# Patient Record
Sex: Female | Born: 1945
Health system: Southern US, Community
[De-identification: ages and names within clinical notes are randomized; demographics above are authoritative.]

## PROBLEM LIST (undated history)

## (undated) DIAGNOSIS — E663 Overweight: Secondary | ICD-10-CM

## (undated) DIAGNOSIS — I10 Essential (primary) hypertension: Secondary | ICD-10-CM

## (undated) DIAGNOSIS — E785 Hyperlipidemia, unspecified: Secondary | ICD-10-CM

## (undated) DIAGNOSIS — H04309 Unspecified dacryocystitis of unspecified lacrimal passage: Secondary | ICD-10-CM

## (undated) DIAGNOSIS — M8580 Other specified disorders of bone density and structure, unspecified site: Secondary | ICD-10-CM

## (undated) DIAGNOSIS — E669 Obesity, unspecified: Secondary | ICD-10-CM

## (undated) DIAGNOSIS — M1612 Unilateral primary osteoarthritis, left hip: Secondary | ICD-10-CM

## (undated) DIAGNOSIS — E1169 Type 2 diabetes mellitus with other specified complication: Secondary | ICD-10-CM

## (undated) DIAGNOSIS — K219 Gastro-esophageal reflux disease without esophagitis: Secondary | ICD-10-CM

## (undated) DIAGNOSIS — D649 Anemia, unspecified: Secondary | ICD-10-CM

## (undated) DIAGNOSIS — I671 Cerebral aneurysm, nonruptured: Secondary | ICD-10-CM

## (undated) HISTORY — DX: Hyperlipidemia, unspecified: E78.5

## (undated) HISTORY — DX: Anemia, unspecified: D64.9

## (undated) HISTORY — DX: Other specified disorders of bone density and structure, unspecified site: M85.80

## (undated) HISTORY — DX: Cerebral aneurysm, nonruptured: I67.1

## (undated) HISTORY — DX: Overweight: E66.3

## (undated) HISTORY — DX: Type 2 diabetes mellitus with other specified complication: E11.69

## (undated) HISTORY — DX: Unilateral primary osteoarthritis, left hip: M16.12

## (undated) HISTORY — DX: Obesity, unspecified: E66.9

## (undated) HISTORY — PX: ABDOMINAL HYSTERECTOMY: SHX81

## (undated) HISTORY — DX: Unspecified dacryocystitis of unspecified lacrimal passage: H04.309

## (undated) HISTORY — PX: CEREBRAL ANEURYSM REPAIR: SHX164

## (undated) HISTORY — DX: Gastro-esophageal reflux disease without esophagitis: K21.9

---

## 2008-04-24 LAB — HM COLONOSCOPY

## 2009-08-08 ENCOUNTER — Encounter: Payer: Self-pay | Admitting: Family Medicine

## 2009-08-08 LAB — HM COLONOSCOPY

## 2013-07-17 ENCOUNTER — Emergency Department (HOSPITAL_BASED_OUTPATIENT_CLINIC_OR_DEPARTMENT_OTHER): Payer: Medicare PPO

## 2013-07-17 ENCOUNTER — Encounter (HOSPITAL_BASED_OUTPATIENT_CLINIC_OR_DEPARTMENT_OTHER): Payer: Self-pay | Admitting: Emergency Medicine

## 2013-07-17 ENCOUNTER — Emergency Department (HOSPITAL_BASED_OUTPATIENT_CLINIC_OR_DEPARTMENT_OTHER)
Admission: EM | Admit: 2013-07-17 | Discharge: 2013-07-17 | Disposition: A | Discharge status: Home / Self Care | Payer: Medicare PPO | Attending: Emergency Medicine | Admitting: Emergency Medicine

## 2013-07-17 DIAGNOSIS — Z79899 Other long term (current) drug therapy: Secondary | ICD-10-CM | POA: Insufficient documentation

## 2013-07-17 DIAGNOSIS — B9789 Other viral agents as the cause of diseases classified elsewhere: Secondary | ICD-10-CM | POA: Insufficient documentation

## 2013-07-17 DIAGNOSIS — I10 Essential (primary) hypertension: Secondary | ICD-10-CM | POA: Insufficient documentation

## 2013-07-17 DIAGNOSIS — R630 Anorexia: Secondary | ICD-10-CM | POA: Insufficient documentation

## 2013-07-17 DIAGNOSIS — B349 Viral infection, unspecified: Secondary | ICD-10-CM

## 2013-07-17 DIAGNOSIS — J40 Bronchitis, not specified as acute or chronic: Secondary | ICD-10-CM | POA: Insufficient documentation

## 2013-07-17 HISTORY — DX: Essential (primary) hypertension: I10

## 2013-07-17 MED ORDER — ONDANSETRON 4 MG PO TBDP
4.0000 mg | ORAL_TABLET | Freq: Once | ORAL | Status: AC
  Administered 2013-07-17: 4 mg via ORAL
  Filled 2013-07-17: qty 1

## 2013-07-17 MED ORDER — NAPROXEN 500 MG PO TABS: 500.0000 mg | ORAL_TABLET | Freq: Two times a day (BID) | ORAL | Status: DC

## 2013-07-17 MED ORDER — HYDROCOD POLST-CHLORPHEN POLST 10-8 MG/5ML PO LQCR: 5.0000 mL | Freq: Two times a day (BID) | ORAL | Status: DC

## 2013-07-17 MED ORDER — GUAIFENESIN ER 600 MG PO TB12: 600.0000 mg | ORAL_TABLET | Freq: Two times a day (BID) | ORAL | Status: DC

## 2013-07-17 MED ORDER — HYDROCODONE-ACETAMINOPHEN 5-325 MG PO TABS
1.0000 | ORAL_TABLET | Freq: Once | ORAL | Status: AC
Start: 2013-07-17 — End: 2013-07-17
  Administered 2013-07-17: 1 via ORAL
  Filled 2013-07-17: qty 1

## 2013-07-17 NOTE — ED Notes (Signed)
Began with dry cough Thursday, with congestion, chills, fever.

## 2013-07-17 NOTE — Discharge Instructions (Signed)

## 2013-07-17 NOTE — ED Provider Notes (Signed)
CSN: [id number]     Arrival date & time 07/17/13  1323 History  This chart was scribed for Tanna Furry, MD by Jenne Campus, ED Scribe. This patient was seen in room MHT13/MHT13 and the patient's care was started at 3:38 PM.   Chief Complaint  Patient presents with  . URI    The history is provided by the patient. No language interpreter was used.    HPI Comments: Morgan Medina is a 68 y.o. female who presents to the Emergency Department complaining of NP cough with associated fever, sore throat, chills and nasal congestion that have been worsening since their onset 3 days ago. Max fever at home was 102. Temperature in the ED is 99.4. She reports a decreased appetite but states that she has been able to eat and drink without any complications. She denies any nausea or emesis, myalgias, HA or back pain. She has a h/o HTN that is treated with daily medication. She reports getting an influenza vaccine this year.    Past Medical History  Diagnosis Date  . Hypertension    Past Surgical History  Procedure Laterality Date  . Abdominal hysterectomy    . Cerebral aneurysm repair     History reviewed. No pertinent family history. History  Substance Use Topics  . Smoking status: Never Smoker   . Smokeless tobacco: Not on file  . Alcohol Use: No   No OB history provided.  Review of Systems  Constitutional: Positive for fever, chills and appetite change. Negative for diaphoresis and fatigue.  HENT: Positive for congestion and sore throat. Negative for mouth sores and trouble swallowing.   Eyes: Negative for visual disturbance.  Respiratory: Positive for cough. Negative for chest tightness, shortness of breath and wheezing.   Cardiovascular: Positive for chest pain.  Gastrointestinal: Negative for nausea, vomiting, abdominal pain, diarrhea and abdominal distention.  Endocrine: Negative for polydipsia, polyphagia and polyuria.  Genitourinary: Negative for dysuria, frequency and hematuria.   Musculoskeletal: Negative for gait problem.  Skin: Negative for color change, pallor and rash.  Neurological: Negative for dizziness, syncope, light-headedness and headaches.  Hematological: Does not bruise/bleed easily.  Psychiatric/Behavioral: Negative for behavioral problems and confusion.    Allergies  Review of patient's allergies indicates no known allergies.  Home Medications   Current Outpatient Rx  Name  Route  Sig  Dispense  Refill  . atorvastatin (LIPITOR) 40 MG tablet   Oral   Take 40 mg by mouth daily.         . ferrous sulfate 325 (65 FE) MG tablet   Oral   Take 325 mg by mouth daily with breakfast.         . losartan (COZAAR) 25 MG tablet   Oral   Take 25 mg by mouth daily.         . Multiple Vitamin (MULTIVITAMIN) capsule   Oral   Take 1 capsule by mouth daily.         Marland Kitchen triamterene-hydrochlorothiazide (DYAZIDE) 37.5-25 MG per capsule   Oral   Take 1 capsule by mouth daily.         . chlorpheniramine-HYDROcodone (TUSSIONEX PENNKINETIC ER) 10-8 MG/5ML LQCR   Oral   Take 5 mLs by mouth every 12 (twelve) hours.   60 mL   0   . guaiFENesin (MUCINEX) 600 MG 12 hr tablet   Oral   Take 1 tablet (600 mg total) by mouth 2 (two) times daily.   20 tablet   0   .  naproxen (NAPROSYN) 500 MG tablet   Oral   Take 1 tablet (500 mg total) by mouth 2 (two) times daily.   30 tablet   0    Triage Vitals: BP 148/85  Pulse 83  Temp(Src) 99.4 F (37.4 C) (Oral)  Resp 18  Ht 5\' 8"  (1.727 m)  Wt 196 lb (88.905 kg)  BMI 29.81 kg/m2  SpO2 100%  Physical Exam  Nursing note and vitals reviewed. Constitutional: She is oriented to person, place, and time. She appears well-developed and well-nourished. No distress.  HENT:  Head: Normocephalic and atraumatic.  No tonsillar enlargement  Eyes: Conjunctivae are normal. Pupils are equal, round, and reactive to light. No scleral icterus.  Neck: Normal range of motion. Neck supple. No thyromegaly present.   Cardiovascular: Normal rate and regular rhythm.  Exam reveals no gallop and no friction rub.   No murmur heard. Pulmonary/Chest: Effort normal and breath sounds normal. No respiratory distress. She has no wheezes. She has no rales.  Abdominal: Soft. Bowel sounds are normal. She exhibits no distension. There is no tenderness. There is no rebound.  Musculoskeletal: Normal range of motion. She exhibits no edema.  Lymphadenopathy:    She has cervical adenopathy (bilateral).  Neurological: She is alert and oriented to person, place, and time.  Skin: Skin is warm and dry. No rash noted.  Psychiatric: She has a normal mood and affect. Her behavior is normal.    ED Course  Procedures (including critical care time)  DIAGNOSTIC STUDIES: Oxygen Saturation is 100% on RA, normal by my interpretation.    COORDINATION OF CARE: 3:42 PM-Informed pt of negative CXR and that the symptoms are most likely viral. Discussed treatment plan which includes at home treatments with rest, fluids, cough suppressant, decongestant and OTC medications with pt at bedside and pt agreed to plan. Advised pt to return if symptoms don't improve in 6 days. Will discharge with hydrocodone cough syrup.  Labs Review Labs Reviewed - No data to display Imaging Review Dg Chest 2 View  07/17/2013   CLINICAL DATA:  Cough congestion and shortness of breath for several days  EXAM: CHEST  2 VIEW  COMPARISON:  None  FINDINGS: The heart size and vascular pattern are normal. There is uncoiling of the aorta. There is minimal discoid atelectasis in the right lower lobe. There is no consolidation or effusion.  IMPRESSION: No significant abnormalities   Electronically Signed   By: Skipper Cliche M.D.   On: 07/17/2013 14:13    EKG Interpretation   None       MDM   1. Viral syndrome   2. Bronchitis    Used work of breathing. Not hypoxemic. Not febrile. Normal chest x-ray. Plan will be symptomatic treatment. She was immunized for  influenza. This may be influenza or other viral syndrome. She is out of the window for antiviral treatment.  I personally performed the services described in this documentation, which was scribed in my presence. The recorded information has been reviewed and is accurate.    Tanna Furry, MD 07/17/13 1550

## 2013-11-28 ENCOUNTER — Ambulatory Visit (INDEPENDENT_AMBULATORY_CARE_PROVIDER_SITE_OTHER): Payer: Medicare PPO | Admitting: Internal Medicine

## 2013-11-28 ENCOUNTER — Encounter: Payer: Self-pay | Admitting: Internal Medicine

## 2013-11-28 VITALS — BP 159/89 | HR 84 | Resp 16 | Ht 68.0 in | Wt 201.0 lb

## 2013-11-28 DIAGNOSIS — D649 Anemia, unspecified: Secondary | ICD-10-CM

## 2013-11-28 DIAGNOSIS — E785 Hyperlipidemia, unspecified: Secondary | ICD-10-CM

## 2013-11-28 DIAGNOSIS — N951 Menopausal and female climacteric states: Secondary | ICD-10-CM

## 2013-11-28 DIAGNOSIS — K635 Polyp of colon: Secondary | ICD-10-CM

## 2013-11-28 DIAGNOSIS — D126 Benign neoplasm of colon, unspecified: Secondary | ICD-10-CM

## 2013-11-28 DIAGNOSIS — Z90711 Acquired absence of uterus with remaining cervical stump: Secondary | ICD-10-CM

## 2013-11-28 DIAGNOSIS — I671 Cerebral aneurysm, nonruptured: Secondary | ICD-10-CM | POA: Insufficient documentation

## 2013-11-28 DIAGNOSIS — I10 Essential (primary) hypertension: Secondary | ICD-10-CM

## 2013-11-28 DIAGNOSIS — Z780 Asymptomatic menopausal state: Secondary | ICD-10-CM | POA: Insufficient documentation

## 2013-11-28 LAB — LIPID PANEL
Cholesterol: 272 mg/dL — ABNORMAL HIGH (ref 0–200)
HDL: 55 mg/dL (ref >39)
LDL Cholesterol: 178 mg/dL — ABNORMAL HIGH (ref 0–99)
Total CHOL/HDL Ratio: 4.9 Ratio
Triglycerides: 197 mg/dL — ABNORMAL HIGH (ref <150)
VLDL: 39 mg/dL (ref 0–40)

## 2013-11-28 LAB — CBC WITH DIFFERENTIAL/PLATELET
Basophils Absolute: 0 10*3/uL (ref 0.0–0.1)
Basophils Relative: 0 % (ref 0–1)
Eosinophils Absolute: 0.1 10*3/uL (ref 0.0–0.7)
Eosinophils Relative: 1 % (ref 0–5)
HCT: 34 % — ABNORMAL LOW (ref 36.0–46.0)
Hemoglobin: 11.5 g/dL — ABNORMAL LOW (ref 12.0–15.0)
Lymphocytes Relative: 34 % (ref 12–46)
Lymphs Abs: 3.1 10*3/uL (ref 0.7–4.0)
MCH: 23.9 pg — ABNORMAL LOW (ref 26.0–34.0)
MCHC: 33.8 g/dL (ref 30.0–36.0)
MCV: 70.5 fL — ABNORMAL LOW (ref 78.0–100.0)
Monocytes Absolute: 0.5 10*3/uL (ref 0.1–1.0)
Monocytes Relative: 5 % (ref 3–12)
Neutro Abs: 5.5 10*3/uL (ref 1.7–7.7)
Neutrophils Relative %: 60 % (ref 43–77)
Platelets: 312 10*3/uL (ref 150–400)
RBC: 4.82 MIL/uL (ref 3.87–5.11)
RDW: 13.8 % (ref 11.5–15.5)
WBC: 9.1 10*3/uL (ref 4.0–10.5)

## 2013-11-28 LAB — COMPREHENSIVE METABOLIC PANEL
ALT: 14 U/L (ref 0–35)
AST: 18 U/L (ref 0–37)
Albumin: 4.5 g/dL (ref 3.5–5.2)
Alkaline Phosphatase: 46 U/L (ref 39–117)
BUN: 16 mg/dL (ref 6–23)
CO2: 29 mEq/L (ref 19–32)
Calcium: 10.1 mg/dL (ref 8.4–10.5)
Chloride: 99 mEq/L (ref 96–112)
Creat: 1.14 mg/dL — ABNORMAL HIGH (ref 0.50–1.10)
Glucose, Bld: 111 mg/dL — ABNORMAL HIGH (ref 70–99)
Potassium: 3.8 mEq/L (ref 3.5–5.3)
Sodium: 142 mEq/L (ref 135–145)
Total Bilirubin: 0.4 mg/dL (ref 0.2–1.2)
Total Protein: 7.5 g/dL (ref 6.0–8.3)

## 2013-11-28 LAB — TSH: TSH: 1.357 u[IU]/mL (ref 0.350–4.500)

## 2013-11-28 MED ORDER — LOSARTAN POTASSIUM-HCTZ 100-25 MG PO TABS: 1.0000 | ORAL_TABLET | Freq: Every day | ORAL | Status: DC

## 2013-11-28 NOTE — Patient Instructions (Addendum)
See me in 2 weeks   To lab today

## 2013-11-28 NOTE — Progress Notes (Addendum)
Subjective:    Patient ID: Morgan Medina, female    DOB: 10-05-1945, 68 y.o.   MRN: [id number]  HPI Morgan Medina is here as new pt primary care.  PMH of cerebral aneurysm S/P coiling 2006,  anemia (on FE supplement), HTN, hyperlipidemia, colon polyps  She has had a Supra-cervical hyterrectomy in 1986 for endometriosis  She has two concerns today.  She knows her BP is not controlled , her primary MD placed her on Losartan 50/25 but home BP checks she reports most days SBP over 150.  She has noticed slightly more frequent headaches. No chest pain,  She does have chronic LE edema  She also notes that her "bronchitis" is back.  She was treated for bronchitis 07/2013 and again in May.  She notes dry cough.  No SOB or chest pain but she has noted wheezing at times.  She was given an antibiotic,  Cough syrup and steroid in May  From UC  ( I do not have notes)   No Known Allergies Past Medical History  Diagnosis Date  . Hypertension   . Hyperlipidemia   . Anemia    Past Surgical History  Procedure Laterality Date  . Abdominal hysterectomy    . Cerebral aneurysm repair     History   Social History  . Marital Status: Widowed    Spouse Name: N/A    Number of Children: N/A  . Years of Education: N/A   Occupational History  . Not on file.   Social History Main Topics  . Smoking status: Never Smoker   . Smokeless tobacco: Never Used  . Alcohol Use: No  . Drug Use: No  . Sexual Activity: No   Other Topics Concern  . Not on file   Social History Narrative  . No narrative on file   Family History  Problem Relation Age of Onset  . Cerebral aneurysm Father   . Alzheimer's disease Mother    Patient Active Problem List   Diagnosis Date Noted  . Anemia 11/28/2013  . HTN (hypertension) 11/28/2013  . Hyperlipidemia 11/28/2013  . S/P abdominal supracervical subtotal hysterectomy 11/28/2013  . Colon polyps 11/28/2013  . Cerebral aneurysm  S/P coiling  [facility]  2006   11/28/2013  .  Menopause 11/28/2013   Current Outpatient Prescriptions on File Prior to Visit  Medication Sig Dispense Refill  . ferrous sulfate 325 (65 FE) MG tablet Take 325 mg by mouth daily with breakfast.      . guaiFENesin (MUCINEX) 600 MG 12 hr tablet Take 1 tablet (600 mg total) by mouth 2 (two) times daily.  20 tablet  0  . Multiple Vitamin (MULTIVITAMIN) capsule Take 1 capsule by mouth daily.       No current facility-administered medications on file prior to visit.       Review of Systems See HPI    Objective:   Physical Exam Physical Exam  Nursing note and vitals reviewed.  Constitutional: She is oriented to person, place, and time. She appears well-developed and well-nourished.  HENT:  Head: Normocephalic and atraumatic.  Cardiovascular: Normal rate and regular rhythm. Exam reveals no gallop and no friction rub.  No murmur heard.  Pulmonary/Chest: Breath sounds normal. She few end exp wheezes. She has no rales.  Neurological: She is alert and oriented to person, place, and time.  Skin: Skin is warm and dry. Ext  2+ edema feet bilaterally  Psychiatric: She has a normal mood and affect. Her behavior is normal.  Assessment & Plan:  HTN:    Will change med to Hyzaar 100/25 one daily.     Follow up with me in 2-3 weeks  Cough/bronchitis   Peak flow 350   .  Will give Albuterol and   Z-pak today  LE edema  Pt states this is not new.  May need temporary stronger diuretic.    Anemia:  She is on FE supplement now  Hyperlipidemia will check today   S/P supracervical hysterectomy   Colon polyps  See me in 2-3 weeks.  Will get all labs today

## 2013-11-29 ENCOUNTER — Telehealth: Payer: Self-pay | Admitting: *Deleted

## 2013-11-29 LAB — VITAMIN D 25 HYDROXY (VIT D DEFICIENCY, FRACTURES): Vit D, 25-Hydroxy: 35 ng/mL (ref 30–89)

## 2013-11-29 MED ORDER — ALBUTEROL SULFATE HFA 108 (90 BASE) MCG/ACT IN AERS: INHALATION_SPRAY | RESPIRATORY_TRACT | Status: DC

## 2013-11-29 MED ORDER — AZITHROMYCIN 250 MG PO TABS: ORAL_TABLET | ORAL | Status: DC

## 2013-11-29 NOTE — Telephone Encounter (Signed)
Morgan Medina called and said that her Rx for the inhaler did not get sent to the pharmacy yesterday

## 2013-11-29 NOTE — Addendum Note (Signed)
Addended by: Emi Belfast D on: 11/29/2013 12:54 PM   Modules accepted: Level of Service

## 2013-11-30 ENCOUNTER — Encounter: Payer: Self-pay | Admitting: *Deleted

## 2013-11-30 NOTE — Telephone Encounter (Signed)
Called [place] to go over lab results and to be sure she keeps appt.  Left message on home phone to let her know labs will be mailed to her.

## 2013-11-30 NOTE — Telephone Encounter (Signed)
Message copied by Gretchen Short on Wed Nov 30, 2013  8:13 AM ------      Message from: Emi Belfast D      Created: Tue Nov 29, 2013  9:57 AM       [facility]            Call pt and let her know that she is slighlty anemic and her cholesterol is quite high.  Advise her to keep her follow up appt with me later this month            OK to mail labs to her - I will give labs to you ------

## 2013-12-12 ENCOUNTER — Ambulatory Visit: Payer: Medicare PPO | Admitting: Internal Medicine

## 2013-12-14 ENCOUNTER — Ambulatory Visit (INDEPENDENT_AMBULATORY_CARE_PROVIDER_SITE_OTHER): Payer: Medicare PPO | Admitting: Internal Medicine

## 2013-12-14 ENCOUNTER — Encounter: Payer: Self-pay | Admitting: Internal Medicine

## 2013-12-14 VITALS — BP 159/91 | HR 90 | Resp 16 | Ht 68.0 in | Wt 203.0 lb

## 2013-12-14 DIAGNOSIS — R05 Cough: Secondary | ICD-10-CM

## 2013-12-14 DIAGNOSIS — I10 Essential (primary) hypertension: Secondary | ICD-10-CM

## 2013-12-14 DIAGNOSIS — Z139 Encounter for screening, unspecified: Secondary | ICD-10-CM

## 2013-12-14 DIAGNOSIS — R059 Cough, unspecified: Secondary | ICD-10-CM

## 2013-12-14 DIAGNOSIS — E785 Hyperlipidemia, unspecified: Secondary | ICD-10-CM

## 2013-12-14 MED ORDER — FUROSEMIDE 20 MG PO TABS: 20.0000 mg | ORAL_TABLET | Freq: Every day | ORAL | Status: DC

## 2013-12-14 MED ORDER — POTASSIUM CHLORIDE CRYS ER 20 MEQ PO TBCR: 20.0000 meq | EXTENDED_RELEASE_TABLET | Freq: Every day | ORAL | Status: DC

## 2013-12-14 MED ORDER — METHYLPREDNISOLONE ACETATE 80 MG/ML IJ SUSP
160.0000 mg | Freq: Once | INTRAMUSCULAR | Status: AC
  Administered 2013-12-14: 160 mg via INTRAMUSCULAR

## 2013-12-14 NOTE — Patient Instructions (Signed)
Take lasix 20 mg with your potassium pill every day   Take the cozaar daily    See me in 3 weeks 30 mins

## 2013-12-14 NOTE — Progress Notes (Signed)
Subjective:    Patient ID: Morgan Medina, female    DOB: [birth year], 68 y.o.   MRN: [id number]  HPI Morgan Medina is here to follow up on her HTN and hyperlipidemia   HTN  She is taking cozaar 100 mg daily but still has a lot of swelling in LE   Hyperlipidemia  See labs  She was not taking her simvastatin and just restarted 1 week ago  No myalgias  Cough better but not gone.  CXR 07/2013  Neg.  She does not use her albuterol daily.  Prednisone she had taken in the past did help  No Known Allergies Past Medical History  Diagnosis Date  . Hypertension   . Hyperlipidemia   . Anemia    Past Surgical History  Procedure Laterality Date  . Abdominal hysterectomy    . Cerebral aneurysm repair     History   Social History  . Marital Status: Widowed    Spouse Name: N/A    Number of Children: N/A  . Years of Education: N/A   Occupational History  . Not on file.   Social History Main Topics  . Smoking status: Never Smoker   . Smokeless tobacco: Never Used  . Alcohol Use: No  . Drug Use: No  . Sexual Activity: No   Other Topics Concern  . Not on file   Social History Narrative  . No narrative on file   Family History  Problem Relation Age of Onset  . Cerebral aneurysm Father   . Alzheimer's disease Mother    Patient Active Problem List   Diagnosis Date Noted  . Anemia 11/28/2013  . HTN (hypertension) 11/28/2013  . Hyperlipidemia 11/28/2013  . S/P abdominal supracervical subtotal hysterectomy 11/28/2013  . Colon polyps 11/28/2013  . Cerebral aneurysm  S/P coiling  [facility]  2006   11/28/2013  . Menopause 11/28/2013   Current Outpatient Prescriptions on File Prior to Visit  Medication Sig Dispense Refill  . albuterol (PROVENTIL HFA;VENTOLIN HFA) 108 (90 BASE) MCG/ACT inhaler Inhale 2 inhaltions every 8 hours as needed for wheezing  1 Inhaler  0  . azithromycin (ZITHROMAX) 250 MG tablet Take as directed  6 tablet  0  . ferrous sulfate 325 (65 FE) MG tablet Take 325 mg by  mouth daily with breakfast.      . guaiFENesin (MUCINEX) 600 MG 12 hr tablet Take 1 tablet (600 mg total) by mouth 2 (two) times daily.  20 tablet  0  . losartan-hydrochlorothiazide (HYZAAR) 100-25 MG per tablet Take 1 tablet by mouth daily.  90 tablet  1  . Multiple Vitamin (MULTIVITAMIN) capsule Take 1 capsule by mouth daily.      . simvastatin (ZOCOR) 20 MG tablet Take 20 mg by mouth daily.       No current facility-administered medications on file prior to visit.       Review of Systems See HPI    Objective:   Physical Exam Physical Exam  Nursing note and vitals reviewed.  Constitutional: She is oriented to person, place, and time. She appears well-developed and well-nourished.  HENT:  Head: Normocephalic and atraumatic.  Cardiovascular: Normal rate and regular rhythm. Exam reveals no gallop and no friction rub.  No murmur heard.  Pulmonary/Chest: Breath sounds normal. She has no wheezes. She has no rales.  Neurological: She is alert and oriented to person, place, and time.  Skin: Skin is warm and dry.  Psychiatric: She has a normal mood and affect. Her behavior is  normal.          Assessment & Plan:  Cough improved Will give Depomedrol  160 mg in office today.  If still persists next visit will need CXR  HTN  With edema  EKG SR no acute change.  Lots of artifact.   Will add Lasix 20 mg with D-dur daily  Advised to bring all med bottles next visit  Hyperlipidemia pt just restarted her simvastatin  Will recheck hepatic next bvisit.     See me in 3 weeks

## 2013-12-17 LAB — QUANTIFERON TB GOLD ASSAY (BLOOD)
Interferon Gamma Release Assay: NEGATIVE
Mitogen value: >10 IU/mL
Quantiferon Nil Value: 0.02 IU/mL
Quantiferon Tb Ag Minus Nil Value: 0.01 IU/mL
TB Ag value: 0.03 IU/mL

## 2013-12-20 ENCOUNTER — Encounter: Payer: Self-pay | Admitting: *Deleted

## 2013-12-20 ENCOUNTER — Telehealth: Payer: Self-pay | Admitting: *Deleted

## 2013-12-20 NOTE — Telephone Encounter (Signed)
Called pt to adv TB test was negative and the completed staff medical report will be mailed out today

## 2014-01-04 ENCOUNTER — Encounter: Payer: Self-pay | Admitting: Internal Medicine

## 2014-01-04 ENCOUNTER — Ambulatory Visit (INDEPENDENT_AMBULATORY_CARE_PROVIDER_SITE_OTHER): Payer: Medicare PPO | Admitting: Internal Medicine

## 2014-01-04 VITALS — BP 136/84 | HR 74 | Resp 17 | Ht 68.0 in | Wt 200.0 lb

## 2014-01-04 DIAGNOSIS — E785 Hyperlipidemia, unspecified: Secondary | ICD-10-CM

## 2014-01-04 DIAGNOSIS — I10 Essential (primary) hypertension: Secondary | ICD-10-CM

## 2014-01-04 DIAGNOSIS — R609 Edema, unspecified: Secondary | ICD-10-CM

## 2014-01-04 LAB — COMPLETE METABOLIC PANEL WITH GFR
ALT: 18 U/L (ref 0–35)
AST: 19 U/L (ref 0–37)
Albumin: 4.4 g/dL (ref 3.5–5.2)
Alkaline Phosphatase: 52 U/L (ref 39–117)
BUN: 24 mg/dL — ABNORMAL HIGH (ref 6–23)
CO2: 33 mEq/L — ABNORMAL HIGH (ref 19–32)
Calcium: 9.8 mg/dL (ref 8.4–10.5)
Chloride: 99 mEq/L (ref 96–112)
Creat: 0.81 mg/dL (ref 0.50–1.10)
GFR, Est African American: 88 mL/min
GFR, Est Non African American: 76 mL/min
Glucose, Bld: 117 mg/dL — ABNORMAL HIGH (ref 70–99)
Potassium: 4.1 mEq/L (ref 3.5–5.3)
Sodium: 142 mEq/L (ref 135–145)
Total Bilirubin: 0.3 mg/dL (ref 0.2–1.2)
Total Protein: 7.5 g/dL (ref 6.0–8.3)

## 2014-01-04 NOTE — Progress Notes (Signed)
Subjective:    Patient ID: Morgan Medina, female    DOB: [birth year], 68 y.o.   MRN: [id number]  HPI Morgan Medina is here to follow up on her LE edema, HTN, Hyperlipidemia and cough  HTN with edema  Improved with lasix  . She is taking her K daily  Hyperlipidemia   She tells me she has restarted her simvastatin   Will  recheck at CPE  Cough   She has a depormedrol injection last visi.  Cough  90 percent better   No Known Allergies Past Medical History  Diagnosis Date  . Hypertension   . Hyperlipidemia   . Anemia    Past Surgical History  Procedure Laterality Date  . Abdominal hysterectomy    . Cerebral aneurysm repair     History   Social History  . Marital Status: Widowed    Spouse Name: N/A    Number of Children: N/A  . Years of Education: N/A   Occupational History  . Not on file.   Social History Main Topics  . Smoking status: Never Smoker   . Smokeless tobacco: Never Used  . Alcohol Use: No  . Drug Use: No  . Sexual Activity: No   Other Topics Concern  . Not on file   Social History Narrative  . No narrative on file   Family History  Problem Relation Age of Onset  . Cerebral aneurysm Father   . Alzheimer's disease Mother    Patient Active Problem List   Diagnosis Date Noted  . Anemia 11/28/2013  . HTN (hypertension) 11/28/2013  . Hyperlipidemia 11/28/2013  . S/P abdominal supracervical subtotal hysterectomy 11/28/2013  . Colon polyps 11/28/2013  . Cerebral aneurysm  S/P coiling  [facility]  2006   11/28/2013  . Menopause 11/28/2013   Current Outpatient Prescriptions on File Prior to Visit  Medication Sig Dispense Refill  . albuterol (PROVENTIL HFA;VENTOLIN HFA) 108 (90 BASE) MCG/ACT inhaler Inhale 2 inhaltions every 8 hours as needed for wheezing  1 Inhaler  0  . azithromycin (ZITHROMAX) 250 MG tablet Take as directed  6 tablet  0  . chlorpheniramine-HYDROcodone (TUSSIONEX) 10-8 MG/5ML LQCR       . ferrous sulfate 325 (65 FE) MG tablet Take 325 mg by  mouth daily with breakfast.      . furosemide (LASIX) 20 MG tablet Take 1 tablet (20 mg total) by mouth daily.  30 tablet  3  . guaiFENesin (MUCINEX) 600 MG 12 hr tablet Take 1 tablet (600 mg total) by mouth 2 (two) times daily.  20 tablet  0  . losartan-hydrochlorothiazide (HYZAAR) 100-25 MG per tablet Take 1 tablet by mouth daily.  90 tablet  1  . Multiple Vitamin (MULTIVITAMIN) capsule Take 1 capsule by mouth daily.      . potassium chloride SA (K-DUR,KLOR-CON) 20 MEQ tablet Take 1 tablet (20 mEq total) by mouth daily.  30 tablet  3  . simvastatin (ZOCOR) 20 MG tablet Take 20 mg by mouth daily.       No current facility-administered medications on file prior to visit.       Review of Systems See HPI    Objective:   Physical Exam  Physical Exam  Nursing note and vitals reviewed.  Constitutional: She is oriented to person, place, and time. She appears well-developed and well-nourished.  HENT:  Head: Normocephalic and atraumatic.  Cardiovascular: Normal rate and regular rhythm. Exam reveals no gallop and no friction rub.  No murmur heard.  Pulmonary/Chest:  Breath sounds normal. She has no wheezes. She has no rales.  Neurological: She is alert and oriented to person, place, and time.  Skin: Skin is warm and dry.  Ext:  No edema Psychiatric: She has a normal mood and affect. Her behavior is normal.         Assessment & Plan:  Htn  Well controlled  Will D/C  HCTZ and continue losartan 100 mg daily,  Lasix 20 mg daily and K-Dur 20 mg daily  Check cmp today  Edema  Continue lasix  Hyperlipidemia will hceck at CPE she has restarted her Simvastatin  See me at CPE

## 2014-01-04 NOTE — Patient Instructions (Signed)
Schedule CPe  To lab today

## 2014-01-11 ENCOUNTER — Telehealth: Payer: Self-pay | Admitting: *Deleted

## 2014-01-11 NOTE — Telephone Encounter (Signed)
Message copied by Baldomero Lamy on Wed Jan 11, 2014  2:17 PM ------      Message from: Emi Belfast D      Created: Fri Jan 06, 2014 11:57 AM       Nira Conn            Call pt and tell her blood sugar was just a little elevated,  Have her call Maudie Mercury and come in to see her for a fasting fingerstick glucose.   Ok to mail lab to her- I placed on your desk ------

## 2014-01-11 NOTE — Telephone Encounter (Signed)
Called Morgan Medina and asked that she return our call

## 2014-01-13 ENCOUNTER — Other Ambulatory Visit (INDEPENDENT_AMBULATORY_CARE_PROVIDER_SITE_OTHER): Payer: Medicare PPO

## 2014-01-13 ENCOUNTER — Encounter: Payer: Self-pay | Admitting: *Deleted

## 2014-01-13 DIAGNOSIS — E785 Hyperlipidemia, unspecified: Secondary | ICD-10-CM

## 2014-01-13 DIAGNOSIS — E781 Pure hyperglyceridemia: Secondary | ICD-10-CM

## 2014-01-13 LAB — POCT CBG (FASTING - GLUCOSE)-MANUAL ENTRY: Glucose Fasting, POC: 121 mg/dL — AB (ref 70–99)

## 2014-01-16 ENCOUNTER — Telehealth: Payer: Self-pay | Admitting: Internal Medicine

## 2014-01-16 NOTE — Progress Notes (Signed)
Pt came by on N/V for a fasting glucose ,result was 121.

## 2014-01-16 NOTE — Telephone Encounter (Signed)
Morgan Medina  Call pt and advise her [facility]    Message back with her response

## 2014-01-17 NOTE — Telephone Encounter (Signed)
L/m w/ info & asked for r/t call

## 2014-01-19 NOTE — Telephone Encounter (Signed)
Pt never r/t call to she if she wanted DM counseling.

## 2014-04-10 ENCOUNTER — Telehealth: Payer: Self-pay | Admitting: *Deleted

## 2014-04-10 ENCOUNTER — Other Ambulatory Visit: Payer: Self-pay | Admitting: *Deleted

## 2014-04-10 MED ORDER — LOSARTAN POTASSIUM 100 MG PO TABS: 100.0000 mg | ORAL_TABLET | Freq: Every day | ORAL | Status: DC

## 2014-04-10 NOTE — Telephone Encounter (Signed)
Refill request for Losartan without HCTZ

## 2014-04-10 NOTE — Telephone Encounter (Signed)
Please send this as a refill request instead of a telephone call

## 2014-04-10 NOTE — Telephone Encounter (Signed)
Refill request

## 2014-04-11 ENCOUNTER — Ambulatory Visit (INDEPENDENT_AMBULATORY_CARE_PROVIDER_SITE_OTHER): Payer: Medicare PPO | Admitting: *Deleted

## 2014-04-11 DIAGNOSIS — Z23 Encounter for immunization: Secondary | ICD-10-CM

## 2014-05-02 ENCOUNTER — Encounter: Payer: Self-pay | Admitting: Internal Medicine

## 2014-05-02 ENCOUNTER — Ambulatory Visit (INDEPENDENT_AMBULATORY_CARE_PROVIDER_SITE_OTHER): Payer: Medicare PPO | Admitting: Internal Medicine

## 2014-05-02 ENCOUNTER — Ambulatory Visit (HOSPITAL_BASED_OUTPATIENT_CLINIC_OR_DEPARTMENT_OTHER)
Admission: RE | Admit: 2014-05-02 | Discharge: 2014-05-02 | Disposition: A | Discharge status: Home / Self Care | Payer: Medicare PPO | Source: Ambulatory Visit | Attending: Internal Medicine | Admitting: Internal Medicine

## 2014-05-02 VITALS — BP 199/100 | HR 84 | Temp 97.9°F | Resp 16 | Ht 68.0 in | Wt 201.0 lb

## 2014-05-02 DIAGNOSIS — R51 Headache: Secondary | ICD-10-CM | POA: Diagnosis not present

## 2014-05-02 DIAGNOSIS — Z9889 Other specified postprocedural states: Secondary | ICD-10-CM | POA: Insufficient documentation

## 2014-05-02 DIAGNOSIS — G441 Vascular headache, not elsewhere classified: Secondary | ICD-10-CM

## 2014-05-02 DIAGNOSIS — B029 Zoster without complications: Secondary | ICD-10-CM

## 2014-05-02 MED ORDER — ATENOLOL 25 MG PO TABS: 25.0000 mg | ORAL_TABLET | Freq: Every day | ORAL | Status: DC

## 2014-05-02 NOTE — Patient Instructions (Signed)
Take two lasix every morning.  Be sure to take your potassium with it  Take atenolol (tenormin)   25 mg every am    See me in 3 weeks

## 2014-05-02 NOTE — Progress Notes (Signed)
Subjective:    Patient ID: Morgan Medina, female    DOB: [birth year], 68 y.o.   MRN: [id number]  HPI 01/04/2014 Htn Well controlled Will D/C HCTZ and continue losartan 100 mg daily, Lasix 20 mg daily and K-Dur 20 mg daily Check cmp today  Edema Continue lasix  Hyperlipidemia will hceck at CPE she has restarted her Simvastatin  See me at CPE  Today   Pt just returned from mission trip from [place].  Had lots of bilateral LE swelling afer getting off plane.  Edema has greatly subsided now.  She is on lasix 20 mg  See BP  Pt has slight headche and she noticed R sided facial numbness and tingling.  She also is S/P coiling of cerebral aneurysm  No visual or speech changes no musle weakness   No Known Allergies Past Medical History  Diagnosis Date  . Hypertension   . Hyperlipidemia   . Anemia    Past Surgical History  Procedure Laterality Date  . Abdominal hysterectomy    . Cerebral aneurysm repair     History   Social History  . Marital Status: Widowed    Spouse Name: N/A    Number of Children: N/A  . Years of Education: N/A   Occupational History  . Not on file.   Social History Main Topics  . Smoking status: Never Smoker   . Smokeless tobacco: Never Used  . Alcohol Use: No  . Drug Use: No  . Sexual Activity: No   Other Topics Concern  . Not on file   Social History Narrative  . No narrative on file   Family History  Problem Relation Age of Onset  . Cerebral aneurysm Father   . Alzheimer's disease Mother    Patient Active Problem List   Diagnosis Date Noted  . Anemia 11/28/2013  . HTN (hypertension) 11/28/2013  . Hyperlipidemia 11/28/2013  . S/P abdominal supracervical subtotal hysterectomy 11/28/2013  . Colon polyps 11/28/2013  . Cerebral aneurysm  S/P coiling  [facility]  2006   11/28/2013  . Menopause 11/28/2013   Current Outpatient Prescriptions on File Prior to Visit  Medication Sig Dispense Refill  . albuterol (PROVENTIL HFA;VENTOLIN  HFA) 108 (90 BASE) MCG/ACT inhaler Inhale 2 inhaltions every 8 hours as needed for wheezing 1 Inhaler 0  . ferrous sulfate 325 (65 FE) MG tablet Take 325 mg by mouth daily with breakfast.    . furosemide (LASIX) 20 MG tablet Take 1 tablet (20 mg total) by mouth daily. 30 tablet 3  . losartan (COZAAR) 100 MG tablet Take 1 tablet (100 mg total) by mouth daily. 90 tablet 0  . Multiple Vitamin (MULTIVITAMIN) capsule Take 1 capsule by mouth daily.    . potassium chloride SA (K-DUR,KLOR-CON) 20 MEQ tablet Take 1 tablet (20 mEq total) by mouth daily. 30 tablet 3  . simvastatin (ZOCOR) 20 MG tablet Take 20 mg by mouth daily.     No current facility-administered medications on file prior to visit.       Review of Systems See HPI    Objective:   Physical Exam  Physical Exam  Nursing note and vitals reviewed.  Constitutional: She is oriented to person, place, and time. She appears well-developed and well-nourished.  HENT:  Head: Normocephalic and atraumatic.  Cardiovascular: Normal rate and regular rhythm. Exam reveals no gallop and no friction rub.  No murmur heard.  Pulmonary/Chest: Breath sounds normal. She has no wheezes. She has no rales.  Neurological: She  is alert and oriented to person, place, and time.  CNII-XII intact Motor no drift 5/5  Reflexes 2+ symmetric Sensory in tact microfilament Cerebellar intact FTN Skin: Skin is warm and dry.  Ext trace edema now Psychiatric: She has a normal mood and affect. Her behavior is normal.         Assessment & Plan:  HTN:  Uncontrolled will advised lasix 40 mg daily with K,  Continue Cozaar 100 mg and add atenolol 25 mg daily  Bialteral LE edema  See above Facial numbness will get CT  See me in 3 weeks

## 2014-05-03 ENCOUNTER — Other Ambulatory Visit: Payer: Self-pay

## 2014-05-03 NOTE — Telephone Encounter (Signed)
Morgan Medina [phone] (W) [phone] (H) [facility] [place]  [name] called and said she needs refills on her furosemide (LASIX) 20 MG tablet / simvastatin (ZOCOR) 20 MG tablet [facility] into [facility]

## 2014-05-03 NOTE — Progress Notes (Signed)
Morgan Medina is aware of her CT results-eh

## 2014-05-04 MED ORDER — SIMVASTATIN 20 MG PO TABS: 20.0000 mg | ORAL_TABLET | Freq: Every day | ORAL | Status: DC

## 2014-05-04 MED ORDER — FUROSEMIDE 20 MG PO TABS: 20.0000 mg | ORAL_TABLET | Freq: Every day | ORAL | Status: DC

## 2014-05-15 ENCOUNTER — Other Ambulatory Visit: Payer: Self-pay

## 2014-05-15 MED ORDER — POTASSIUM CHLORIDE CRYS ER 20 MEQ PO TBCR: 20.0000 meq | EXTENDED_RELEASE_TABLET | Freq: Every day | ORAL | Status: DC

## 2014-05-15 NOTE — Telephone Encounter (Signed)
Refill request

## 2014-05-15 NOTE — Telephone Encounter (Signed)
Morgan Medina [phone] [facility] [place]  [name] called to say she needs a refill on her potassium chloride SA (K-DUR,KLOR-CON) 20 MEQ tablet

## 2014-05-17 ENCOUNTER — Encounter: Payer: Medicare PPO | Admitting: Internal Medicine

## 2014-05-17 NOTE — Progress Notes (Signed)
Subjective:    Patient ID: Morgan Medina, female    DOB: [birth year], 68 y.o.   MRN: [id number]  HPI 11/17  HTN: Uncontrolled will advised lasix 40 mg daily with K, Continue Cozaar 100 mg and add atenolol 25 mg daily  Bialteral LE edema See above Facial numbness will get CT  See me in 3 weeks     Morgan Medina returns for follow up for LE edema and uncontrolled HTN  No Known Allergies Past Medical History  Diagnosis Date  . Hypertension   . Hyperlipidemia   . Anemia    Past Surgical History  Procedure Laterality Date  . Abdominal hysterectomy    . Cerebral aneurysm repair     History   Social History  . Marital Status: Widowed    Spouse Name: N/A    Number of Children: N/A  . Years of Education: N/A   Occupational History  . Not on file.   Social History Main Topics  . Smoking status: Never Smoker   . Smokeless tobacco: Never Used  . Alcohol Use: No  . Drug Use: No  . Sexual Activity: No   Other Topics Concern  . Not on file   Social History Narrative   Family History  Problem Relation Age of Onset  . Cerebral aneurysm Father   . Alzheimer's disease Mother    Patient Active Problem List   Diagnosis Date Noted  . Herpes zoster  history of facial zoster 05/02/2014  . Anemia 11/28/2013  . HTN (hypertension) 11/28/2013  . Hyperlipidemia 11/28/2013  . S/P abdominal supracervical subtotal hysterectomy 11/28/2013  . Colon polyps 11/28/2013  . Cerebral aneurysm  S/P coiling  [facility]  2006   11/28/2013  . Menopause 11/28/2013   Current Outpatient Prescriptions on File Prior to Visit  Medication Sig Dispense Refill  . albuterol (PROVENTIL HFA;VENTOLIN HFA) 108 (90 BASE) MCG/ACT inhaler Inhale 2 inhaltions every 8 hours as needed for wheezing 1 Inhaler 0  . atenolol (TENORMIN) 25 MG tablet Take 1 tablet (25 mg total) by mouth daily. 30 tablet 1  . Cyanocobalamin (VITAMIN B 12) 250 MCG LOZG Take by mouth.    . ferrous sulfate 325 (65 FE) MG tablet Take 325  mg by mouth daily with breakfast.    . furosemide (LASIX) 20 MG tablet Take 1 tablet (20 mg total) by mouth daily. 30 tablet 3  . losartan (COZAAR) 100 MG tablet Take 1 tablet (100 mg total) by mouth daily. 90 tablet 0  . Multiple Vitamin (MULTIVITAMIN) capsule Take 1 capsule by mouth daily.    . potassium chloride SA (K-DUR,KLOR-CON) 20 MEQ tablet Take 1 tablet (20 mEq total) by mouth daily. 30 tablet 3  . simvastatin (ZOCOR) 20 MG tablet Take 1 tablet (20 mg total) by mouth daily. 30 tablet 3   No current facility-administered medications on file prior to visit.       Review of Systems    see HPI Objective:   Physical Exam  Physical Exam  Nursing note and vitals reviewed.  Constitutional: She is oriented to person, place, and time. She appears well-developed and well-nourished.  HENT:  Head: Normocephalic and atraumatic.  Cardiovascular: Normal rate and regular rhythm. Exam reveals no gallop and no friction rub.  No murmur heard.  Pulmonary/Chest: Breath sounds normal. She has no wheezes. She has no rales.  Neurological: She is alert and oriented to person, place, and time.  Skin: Skin is warm and dry.  Psychiatric: She has a normal mood  and affect. Her behavior is normal.             Assessment & Plan:

## 2014-07-04 ENCOUNTER — Emergency Department (HOSPITAL_BASED_OUTPATIENT_CLINIC_OR_DEPARTMENT_OTHER)
Admission: EM | Admit: 2014-07-04 | Discharge: 2014-07-04 | Disposition: A | Discharge status: Home / Self Care | Payer: Medicare PPO | Attending: Emergency Medicine | Admitting: Emergency Medicine

## 2014-07-04 ENCOUNTER — Encounter (HOSPITAL_BASED_OUTPATIENT_CLINIC_OR_DEPARTMENT_OTHER): Payer: Self-pay | Admitting: *Deleted

## 2014-07-04 DIAGNOSIS — E785 Hyperlipidemia, unspecified: Secondary | ICD-10-CM | POA: Insufficient documentation

## 2014-07-04 DIAGNOSIS — I10 Essential (primary) hypertension: Secondary | ICD-10-CM | POA: Insufficient documentation

## 2014-07-04 DIAGNOSIS — D649 Anemia, unspecified: Secondary | ICD-10-CM | POA: Insufficient documentation

## 2014-07-04 DIAGNOSIS — Z79899 Other long term (current) drug therapy: Secondary | ICD-10-CM | POA: Diagnosis not present

## 2014-07-04 LAB — BASIC METABOLIC PANEL
Anion gap: 8 (ref 5–15)
BUN: 12 mg/dL (ref 6–23)
CO2: 29 mmol/L (ref 19–32)
Calcium: 9.6 mg/dL (ref 8.4–10.5)
Chloride: 104 mEq/L (ref 96–112)
Creatinine, Ser: 0.68 mg/dL (ref 0.50–1.10)
GFR calc Af Amer: >90 mL/min (ref >90)
GFR calc non Af Amer: 89 mL/min — ABNORMAL LOW (ref >90)
Glucose, Bld: 118 mg/dL — ABNORMAL HIGH (ref 70–99)
Potassium: 3.4 mmol/L — ABNORMAL LOW (ref 3.5–5.1)
Sodium: 141 mmol/L (ref 135–145)

## 2014-07-04 MED ORDER — AMLODIPINE BESYLATE 5 MG PO TABS
10.0000 mg | ORAL_TABLET | Freq: Once | ORAL | Status: AC
  Administered 2014-07-04: 10 mg via ORAL
  Filled 2014-07-04: qty 2

## 2014-07-04 MED ORDER — AMLODIPINE BESYLATE 10 MG PO TABS: 10.0000 mg | ORAL_TABLET | Freq: Every day | ORAL | Status: DC

## 2014-07-04 NOTE — ED Provider Notes (Signed)
CSN: [id number]     Arrival date & time 07/04/14  1040 History   First MD Initiated Contact with Patient 07/04/14 1119     Chief Complaint  Patient presents with  . Hypertension     (Consider location/radiation/quality/duration/timing/severity/associated sxs/prior Treatment) HPI Comments: The patient is a 69 year old female with a history of hypertension and hyperlipidemia. She presents to the hospital with severe hypertension which she states has been present for over 2 weeks. She denies any other symptoms at this time including headaches, neck pain, chest pain, back pain or swelling of the legs though she does report intermittent right-sided neck pain over the last couple of days. This is not exertional, it is not positional. She states that she is on her antihypertensives including losartan and atenolol, she has been on these medications for a couple of months, the atenolol for a long time according to the patient. She has not been on any other antihypertensives in the past. The blood pressure continues to stay high, she has not seen her family doctor for a recheck, she is not started over-the-counter medications, she denies any exertional symptoms but does endorse having a significant stressors in her home life which she states are now gone prompting her visit for blood pressure.  Patient is a 69 y.o. female presenting with hypertension. The history is provided by the patient.  Hypertension    Past Medical History  Diagnosis Date  . Hypertension   . Hyperlipidemia   . Anemia    Past Surgical History  Procedure Laterality Date  . Abdominal hysterectomy    . Cerebral aneurysm repair     Family History  Problem Relation Age of Onset  . Cerebral aneurysm Father   . Alzheimer's disease Mother    History  Substance Use Topics  . Smoking status: Never Smoker   . Smokeless tobacco: Never Used  . Alcohol Use: No   OB History    Gravida Para Term Preterm AB TAB SAB Ectopic Multiple  Living   0 0 0 0 0 0 0 0 0 0      Review of Systems  All other systems reviewed and are negative.     Allergies  Review of patient's allergies indicates no known allergies.  Home Medications   Prior to Admission medications   Medication Sig Start Date End Date Taking? Authorizing Provider  albuterol (PROVENTIL HFA;VENTOLIN HFA) 108 (90 BASE) MCG/ACT inhaler Inhale 2 inhaltions every 8 hours as needed for wheezing 11/29/13   Lanice Shirts, MD  amLODipine (NORVASC) 10 MG tablet Take 1 tablet (10 mg total) by mouth daily. 07/04/14   Johnna Acosta, MD  atenolol (TENORMIN) 25 MG tablet Take 1 tablet (25 mg total) by mouth daily. 05/02/14   Lanice Shirts, MD  Cyanocobalamin (VITAMIN B 12) 250 MCG LOZG Take by mouth.    Historical Provider, MD  ferrous sulfate 325 (65 FE) MG tablet Take 325 mg by mouth daily with breakfast.    Historical Provider, MD  furosemide (LASIX) 20 MG tablet Take 1 tablet (20 mg total) by mouth daily. 05/04/14   Lanice Shirts, MD  losartan (COZAAR) 100 MG tablet Take 1 tablet (100 mg total) by mouth daily. 04/10/14   Lanice Shirts, MD  Multiple Vitamin (MULTIVITAMIN) capsule Take 1 capsule by mouth daily.    Historical Provider, MD  potassium chloride SA (K-DUR,KLOR-CON) 20 MEQ tablet Take 1 tablet (20 mEq total) by mouth daily. 05/15/14   Lanice Shirts, MD  simvastatin (ZOCOR) 20 MG tablet Take 1 tablet (20 mg total) by mouth daily. 05/04/14   Lanice Shirts, MD   BP 190/95 mmHg  Pulse 59  Temp(Src) 97.9 F (36.6 C) (Oral)  Resp 18  Ht 5\' 8"  (1.727 m)  Wt 198 lb (89.812 kg)  BMI 30.11 kg/m2  SpO2 100% Physical Exam  Constitutional: She appears well-developed and well-nourished. No distress.  HENT:  Head: Normocephalic and atraumatic.  Mouth/Throat: Oropharynx is clear and moist. No oropharyngeal exudate.  Eyes: Conjunctivae and EOM are normal. Pupils are equal, round, and reactive to light. Right eye exhibits no  discharge. Left eye exhibits no discharge. No scleral icterus.  Neck: Normal range of motion. Neck supple. No JVD present. No thyromegaly present.  Cardiovascular: Normal rate, regular rhythm, normal heart sounds and intact distal pulses.  Exam reveals no gallop and no friction rub.   No murmur heard. Pulmonary/Chest: Effort normal and breath sounds normal. No respiratory distress. She has no wheezes. She has no rales.  Abdominal: Soft. Bowel sounds are normal. She exhibits no distension and no mass. There is no tenderness.  Musculoskeletal: Normal range of motion. She exhibits no edema or tenderness.  Lymphadenopathy:    She has no cervical adenopathy.  Neurological: She is alert. Coordination normal.  Speech is clear, cranial nerves III through XII are intact, memory is intact, strength is normal in all 4 extremities including grips, sensation is intact to light touch and pinprick in all 4 extremities. Coordination as tested by finger-nose-finger is normal, no limb ataxia. Normal gait, normal reflexes at the patellar tendons bilaterally  Skin: Skin is warm and dry. No rash noted. No erythema.  Psychiatric: She has a normal mood and affect. Her behavior is normal.  Nursing note and vitals reviewed.   ED Course  Procedures (including critical care time) Labs Review Labs Reviewed  BASIC METABOLIC PANEL - Abnormal; Notable for the following:    Potassium 3.4 (*)    Glucose, Bld 118 (*)    GFR calc non Af Amer 89 (*)    All other components within normal limits    Imaging Review No results found.   EKG Interpretation   Date/Time:  Tuesday July 04 2014 10:58:47 EST Ventricular Rate:  57 PR Interval:  184 QRS Duration: 86 QT Interval:  480 QTC Calculation: 467 R Axis:   4 Text Interpretation:  Sinus bradycardia Low voltage QRS Cannot rule out  Anterior infarct , age undetermined Abnormal ECG No old tracing to compare  Confirmed by Gabby Rackers  MD, Soniyah Mcglory ([id number]) on 07/04/2014  11:04:41 AM      MDM   Final diagnoses:  Essential hypertension    Well-appearing, vital signs are significant for hypertension of 196/95, pulse in the upper 50s consistent with atenolol mediated bradycardia, no fever, no tachypnea, no focal neurologic symptoms. Check renal function, anticipating the need to start a new medication that she is on the maximum dose of her angiotensin receptor blocker and is already mildly bradycardic from the beta blocker. Otherwise she has no focal symptoms to suggest further workup, EKG is totally normal, we'll start Norvasc and anticipate discharge for blood pressure follow-up in the community.  Blood pressure stably elevated, will continue to come down, I do not want to make it come down to much given this significant hypertension, patient aware she needs to follow-up closely, started Norvasc, patient explained and has expressed her understanding to the indications for return.   Meds given in ED:  Medications  amLODipine (NORVASC) tablet 10 mg (10 mg Oral Given 07/04/14 1158)    New Prescriptions   AMLODIPINE (NORVASC) 10 MG TABLET    Take 1 tablet (10 mg total) by mouth daily.      Johnna Acosta, MD 07/04/14 1318

## 2014-07-04 NOTE — ED Notes (Signed)
Pt placed on cardiac monitor, spo2 and bp q 30 min. Denies any chest pain or sob.

## 2014-07-04 NOTE — ED Notes (Signed)
Headaches and right sided neck pain. High blood pressure x 2 weeks.

## 2014-07-04 NOTE — Discharge Instructions (Signed)

## 2014-07-05 ENCOUNTER — Other Ambulatory Visit: Payer: Self-pay | Admitting: Internal Medicine

## 2014-07-19 ENCOUNTER — Other Ambulatory Visit (HOSPITAL_COMMUNITY)
Admission: RE | Admit: 2014-07-19 | Discharge: 2014-07-19 | Disposition: A | Discharge status: Home / Self Care | Payer: Medicare PPO | Source: Ambulatory Visit | Attending: Internal Medicine | Admitting: Internal Medicine

## 2014-07-19 ENCOUNTER — Encounter: Payer: Self-pay | Admitting: *Deleted

## 2014-07-19 ENCOUNTER — Ambulatory Visit (INDEPENDENT_AMBULATORY_CARE_PROVIDER_SITE_OTHER): Payer: Medicare PPO | Admitting: Internal Medicine

## 2014-07-19 ENCOUNTER — Encounter: Payer: Self-pay | Admitting: Internal Medicine

## 2014-07-19 VITALS — BP 136/80 | HR 71 | Resp 16 | Ht 68.0 in | Wt 200.0 lb

## 2014-07-19 DIAGNOSIS — Z1211 Encounter for screening for malignant neoplasm of colon: Secondary | ICD-10-CM

## 2014-07-19 DIAGNOSIS — R609 Edema, unspecified: Secondary | ICD-10-CM

## 2014-07-19 DIAGNOSIS — Z124 Encounter for screening for malignant neoplasm of cervix: Secondary | ICD-10-CM | POA: Diagnosis present

## 2014-07-19 DIAGNOSIS — I10 Essential (primary) hypertension: Secondary | ICD-10-CM

## 2014-07-19 DIAGNOSIS — E785 Hyperlipidemia, unspecified: Secondary | ICD-10-CM

## 2014-07-19 DIAGNOSIS — Z1151 Encounter for screening for human papillomavirus (HPV): Secondary | ICD-10-CM

## 2014-07-19 DIAGNOSIS — Z0000 Encounter for general adult medical examination without abnormal findings: Secondary | ICD-10-CM

## 2014-07-19 LAB — CBC WITH DIFFERENTIAL/PLATELET
Basophils Absolute: 0 10*3/uL (ref 0.0–0.1)
Basophils Relative: 0 % (ref 0–1)
Eosinophils Absolute: 0.1 10*3/uL (ref 0.0–0.7)
Eosinophils Relative: 1 % (ref 0–5)
HCT: 35.8 % — ABNORMAL LOW (ref 36.0–46.0)
Hemoglobin: 11.7 g/dL — ABNORMAL LOW (ref 12.0–15.0)
Lymphocytes Relative: 41 % (ref 12–46)
Lymphs Abs: 3.4 10*3/uL (ref 0.7–4.0)
MCH: 23.5 pg — ABNORMAL LOW (ref 26.0–34.0)
MCHC: 32.7 g/dL (ref 30.0–36.0)
MCV: 71.9 fL — ABNORMAL LOW (ref 78.0–100.0)
MPV: 9.9 fL (ref 8.6–12.4)
Monocytes Absolute: 0.5 10*3/uL (ref 0.1–1.0)
Monocytes Relative: 6 % (ref 3–12)
Neutro Abs: 4.3 10*3/uL (ref 1.7–7.7)
Neutrophils Relative %: 52 % (ref 43–77)
Platelets: 275 10*3/uL (ref 150–400)
RBC: 4.98 MIL/uL (ref 3.87–5.11)
RDW: 13.7 % (ref 11.5–15.5)
WBC: 8.3 10*3/uL (ref 4.0–10.5)

## 2014-07-19 LAB — COMPLETE METABOLIC PANEL WITH GFR
ALT: 14 U/L (ref 0–35)
AST: 14 U/L (ref 0–37)
Albumin: 4.6 g/dL (ref 3.5–5.2)
Alkaline Phosphatase: 60 U/L (ref 39–117)
BUN: 17 mg/dL (ref 6–23)
CO2: 30 mEq/L (ref 19–32)
Calcium: 10.3 mg/dL (ref 8.4–10.5)
Chloride: 104 mEq/L (ref 96–112)
Creat: 0.84 mg/dL (ref 0.50–1.10)
GFR, Est African American: 84 mL/min
GFR, Est Non African American: 73 mL/min
Glucose, Bld: 120 mg/dL — ABNORMAL HIGH (ref 70–99)
Potassium: 4.5 mEq/L (ref 3.5–5.3)
Sodium: 144 mEq/L (ref 135–145)
Total Bilirubin: 0.4 mg/dL (ref 0.2–1.2)
Total Protein: 7.5 g/dL (ref 6.0–8.3)

## 2014-07-19 LAB — POCT URINALYSIS DIPSTICK
Bilirubin, UA: NEGATIVE
Blood, UA: NEGATIVE
Glucose, UA: NEGATIVE
Ketones, UA: NEGATIVE
Leukocytes, UA: NEGATIVE
Nitrite, UA: NEGATIVE
Protein, UA: NEGATIVE
Spec Grav, UA: 1.01
Urobilinogen, UA: NEGATIVE
pH, UA: 6.5

## 2014-07-19 LAB — HEMOCCULT GUIAC POC 1CARD (OFFICE)
Card #1 Date: 2032016
Fecal Occult Blood, POC: NEGATIVE

## 2014-07-19 LAB — LIPID PANEL
Cholesterol: 208 mg/dL — ABNORMAL HIGH (ref 0–200)
HDL: 55 mg/dL (ref >39)
LDL Cholesterol: 133 mg/dL — ABNORMAL HIGH (ref 0–99)
Total CHOL/HDL Ratio: 3.8 Ratio
Triglycerides: 102 mg/dL (ref <150)
VLDL: 20 mg/dL (ref 0–40)

## 2014-07-19 NOTE — Progress Notes (Signed)
Subjective:    Patient ID: Morgan Medina, female    DOB: December 27, 1945, 69 y.o.   MRN: [id number]  HPI 04/2014 note HTN: Uncontrolled will advised lasix 40 mg daily with K, Continue Cozaar 100 mg and add atenolol 25 mg daily  Bialteral LE edema See above Facial numbness will get CT  See me in 3 weeks  07/04/2014 ER note Well-appearing, vital signs are significant for hypertension of 196/95, pulse in the upper 50s consistent with atenolol mediated bradycardia, no fever, no tachypnea, no focal neurologic symptoms. Check renal function, anticipating the need to start a new medication that she is on the maximum dose of her angiotensin receptor blocker and is already mildly bradycardic from the beta blocker. Otherwise she has no focal symptoms to suggest further workup, EKG is totally normal, we'll start Norvasc and anticipate discharge for blood pressure follow-up in the community.  Blood pressure stably elevated, will continue to come down, I do not want to make it come down to much given this significant hypertension, patient aware she needs to follow-up closely, started Norvasc, patient explained and has expressed her understanding to the indications for return.   Meds given in ED:   TODAY  Morgan Medina is here for CPE  HM:  UTD with mm,  Colonoscopy .  Needs pap today.  Declines all vaccines today as she wants to check with insurance   Dexa 2014 normal   HTN  See ER note above  NOrvasc 10 mg added to medications   NO headache no chest pain.  We discussed taking 40 mg of Lasix at my last visit but pt only taking 20 mg.    Hyperlipidemia  She had not been taking her Zocor but restarted   Facial numbness no further episodes since last fall CT neg for acute process       No Known Allergies Past Medical History  Diagnosis Date  . Hypertension   . Hyperlipidemia   . Anemia    Past Surgical History  Procedure Laterality Date  . Abdominal hysterectomy    . Cerebral aneurysm repair      History   Social History  . Marital Status: Widowed    Spouse Name: N/A    Number of Children: N/A  . Years of Education: N/A   Occupational History  . Not on file.   Social History Main Topics  . Smoking status: Never Smoker   . Smokeless tobacco: Never Used  . Alcohol Use: No  . Drug Use: No  . Sexual Activity: No   Other Topics Concern  . Not on file   Social History Narrative   Family History  Problem Relation Age of Onset  . Cerebral aneurysm Father   . Alzheimer's disease Mother    Patient Active Problem List   Diagnosis Date Noted  . Herpes zoster  history of facial zoster 05/02/2014  . Anemia 11/28/2013  . HTN (hypertension) 11/28/2013  . Hyperlipidemia 11/28/2013  . S/P abdominal supracervical subtotal hysterectomy 11/28/2013  . Colon polyps 11/28/2013  . Cerebral aneurysm  S/P coiling  [facility]  2006   11/28/2013  . Menopause 11/28/2013   Current Outpatient Prescriptions on File Prior to Visit  Medication Sig Dispense Refill  . albuterol (PROVENTIL HFA;VENTOLIN HFA) 108 (90 BASE) MCG/ACT inhaler Inhale 2 inhaltions every 8 hours as needed for wheezing 1 Inhaler 0  . amLODipine (NORVASC) 10 MG tablet Take 1 tablet (10 mg total) by mouth daily. 30 tablet 1  . atenolol (TENORMIN)  25 MG tablet TAKE 1 TABLET BY MOUTH EVERY DAY 90 tablet 1  . Cyanocobalamin (VITAMIN B 12) 250 MCG LOZG Take by mouth.    . ferrous sulfate 325 (65 FE) MG tablet Take 325 mg by mouth daily with breakfast.    . furosemide (LASIX) 20 MG tablet Take 1 tablet (20 mg total) by mouth daily. 30 tablet 3  . losartan (COZAAR) 100 MG tablet Take 1 tablet (100 mg total) by mouth daily. 90 tablet 0  . Multiple Vitamin (MULTIVITAMIN) capsule Take 1 capsule by mouth daily.    . potassium chloride SA (K-DUR,KLOR-CON) 20 MEQ tablet Take 1 tablet (20 mEq total) by mouth daily. 30 tablet 3  . simvastatin (ZOCOR) 20 MG tablet Take 1 tablet (20 mg total) by mouth daily. 30 tablet 3   No current  facility-administered medications on file prior to visit.       Review of Systems  Respiratory: Negative for cough, chest tightness, shortness of breath and wheezing.   Cardiovascular: Negative for chest pain, palpitations and leg swelling.  All other systems reviewed and are negative.      Objective:   Physical Exam Physical Exam  Vital signs and nursing note reviewed  Constitutional: She is oriented to person, place, and time. She appears well-developed and well-nourished. She is cooperative.  HENT:  Head: Normocephalic and atraumatic.  Right Ear: Tympanic membrane normal.  Left Ear: Tympanic membrane normal.  Nose: Nose normal.  Mouth/Throat: Oropharynx is clear and moist and mucous membranes are normal. No oropharyngeal exudate or posterior oropharyngeal erythema.  Eyes: Conjunctivae and EOM are normal. Pupils are equal, round, and reactive to light.  Neck: Neck supple. No JVD present. Carotid bruit is not present. No mass and no thyromegaly present.  Cardiovascular: Regular rhythm, normal heart sounds, intact distal pulses and normal pulses.  Exam reveals no gallop and no friction rub.   No murmur heard. Pulses:      Dorsalis pedis pulses are 2+ on the right side, and 2+ on the left side.  Pulmonary/Chest: Breath sounds normal. She has no wheezes. She has no rhonchi. She has no rales. Right breast exhibits no mass, no nipple discharge and no skin change. Left breast exhibits no mass, no nipple discharge and no skin change.  Abdominal: Soft. Bowel sounds are normal. She exhibits no distension and no mass. There is no hepatosplenomegaly. There is no tenderness. There is no CVA tenderness.  Genitourinary: Rectum normal, vagina normal   S/P remote supracervical Hysterectomy  Rectal exam shows no mass. Guaiac negative stool. No labial fusion. There is no lesion on the right labia. There is no lesion on the left labia. Cervix exhibits no motion tenderness. Right adnexum displays no  mass, no tenderness and no fullness. Left adnexum displays no mass, no tenderness and no fullness. No erythema around the vagina.  Musculoskeletal:       No active synovitis to any joint.    Lymphadenopathy:       Right cervical: No superficial cervical adenopathy present.      Left cervical: No superficial cervical adenopathy present.       Right axillary: No pectoral and no lateral adenopathy present.       Left axillary: No pectoral and no lateral adenopathy present.      Right: No inguinal adenopathy present.       Left: No inguinal adenopathy present.  Neurological: She is alert and oriented to person, place, and time. She has normal strength and  normal reflexes. No cranial nerve deficit or sensory deficit. She displays a negative Romberg sign. Coordination and gait normal.  Skin: Skin is warm and dry. No abrasion, no bruising, no ecchymosis and no rash noted. No cyanosis. Nails show no clubbing.  Psychiatric: She has a normal mood and affect. Her speech is normal and behavior is normal.          Assessment & Plan:  HM:    Pt declines vaccines today . Wants to check with insurance.  Pap today  Supra cervical hysterectomy  .  She is a non-smoker  HTN  Continue NOrvasc,  Atenolol,  Lasix and Cozaar  Repeat BP my exam normal.  Pt does not like to take meds and I do not think she is taking them correctly     Check all labs today  Hyperlipidemia    She was not taking her Zocor  But restarted a few months ago.  Will recheck today  Colon polyp  Last colonoscopy 2011 Dr. Marin Comment at [facility] .  Pt advised to call for recheck    Cerebral aneurysm  S/P coiling    She is at high risk for CVA with  htn  Advised  Baby ASA one daily  She has not H/P GI bleed or ulcer  No renal problems       Assessment & Plan:

## 2014-07-20 ENCOUNTER — Encounter: Payer: Self-pay | Admitting: *Deleted

## 2014-07-21 LAB — CYTOLOGY - PAP

## 2014-07-24 ENCOUNTER — Other Ambulatory Visit: Payer: Self-pay | Admitting: *Deleted

## 2014-07-24 NOTE — Telephone Encounter (Signed)
Refill request

## 2014-07-25 MED ORDER — LOSARTAN POTASSIUM 100 MG PO TABS: 100.0000 mg | ORAL_TABLET | Freq: Every day | ORAL | Status: DC

## 2014-09-04 ENCOUNTER — Other Ambulatory Visit: Payer: Self-pay | Admitting: *Deleted

## 2014-09-04 MED ORDER — AMLODIPINE BESYLATE 10 MG PO TABS: 10.0000 mg | ORAL_TABLET | Freq: Every day | ORAL | Status: DC

## 2014-09-04 NOTE — Telephone Encounter (Signed)
Refill request for 90 day supply.

## 2014-09-13 ENCOUNTER — Encounter: Payer: Self-pay | Admitting: Internal Medicine

## 2014-09-13 ENCOUNTER — Ambulatory Visit (INDEPENDENT_AMBULATORY_CARE_PROVIDER_SITE_OTHER): Payer: Medicare PPO | Admitting: Internal Medicine

## 2014-09-13 ENCOUNTER — Other Ambulatory Visit: Payer: Self-pay | Admitting: Internal Medicine

## 2014-09-13 VITALS — BP 136/84 | HR 72 | Resp 16 | Ht 68.0 in | Wt 201.0 lb

## 2014-09-13 DIAGNOSIS — Z1231 Encounter for screening mammogram for malignant neoplasm of breast: Secondary | ICD-10-CM

## 2014-09-13 DIAGNOSIS — E785 Hyperlipidemia, unspecified: Secondary | ICD-10-CM | POA: Diagnosis not present

## 2014-09-13 DIAGNOSIS — Z0000 Encounter for general adult medical examination without abnormal findings: Secondary | ICD-10-CM

## 2014-09-13 DIAGNOSIS — I10 Essential (primary) hypertension: Secondary | ICD-10-CM

## 2014-09-13 NOTE — Progress Notes (Signed)
Subjective:    Patient ID: Morgan Medina, female    DOB: 1946/05/28, 68 y.o.   MRN: [id number]  HPI 07/19/2014 Assessment & Plan:  HM: Pt declines vaccines today . Wants to check with insurance. Pap today Supra cervical hysterectomy . She is a non-smoker  HTN Continue NOrvasc, Atenolol, Lasix and Cozaar Repeat BP my exam normal. Pt does not like to take meds and I do not think she is taking them correctly Check all labs today  Hyperlipidemia She was not taking her Zocor But restarted a few months ago. Will recheck today  Colon polyp Last colonoscopy 2011 Dr. Marin Comment at [facility] . Pt advised to call for recheck   Cerebral aneurysm S/P coiling She is at high risk for CVA with htn Advised Baby ASA one daily She has not H/P GI bleed or ulcer No renal problems          TODAY:  Morgan Medina is here for follow up after adding Norvasc to her bp regimen.   She tells me her SBP has been running 120-140 as outpt.    She does not like to take meds   See labs  She had just restarted her Zocor a few weeks ago.    She reports rare tension headache that is relieved by Tylenol   No Known Allergies Past Medical History  Diagnosis Date  . Hypertension   . Hyperlipidemia   . Anemia    Past Surgical History  Procedure Laterality Date  . Abdominal hysterectomy    . Cerebral aneurysm repair     History   Social History  . Marital Status: Widowed    Spouse Name: N/A  . Number of Children: N/A  . Years of Education: N/A   Occupational History  . Not on file.   Social History Main Topics  . Smoking status: Never Smoker   . Smokeless tobacco: Never Used  . Alcohol Use: No  . Drug Use: No  . Sexual Activity: No   Other Topics Concern  . Not on file   Social History Narrative   Family History  Problem Relation Age of Onset  . Cerebral aneurysm Father   . Alzheimer's disease Mother    Patient Active Problem List   Diagnosis Date Noted  . Herpes  zoster  history of facial zoster 05/02/2014  . Anemia 11/28/2013  . HTN (hypertension) 11/28/2013  . Hyperlipidemia 11/28/2013  . S/P abdominal supracervical subtotal hysterectomy 11/28/2013  . Colon polyps 11/28/2013  . Cerebral aneurysm  S/P coiling  [facility]  2006   11/28/2013  . Menopause 11/28/2013   Current Outpatient Prescriptions on File Prior to Visit  Medication Sig Dispense Refill  . albuterol (PROVENTIL HFA;VENTOLIN HFA) 108 (90 BASE) MCG/ACT inhaler Inhale 2 inhaltions every 8 hours as needed for wheezing 1 Inhaler 0  . amLODipine (NORVASC) 10 MG tablet Take 1 tablet (10 mg total) by mouth daily. 90 tablet 1  . atenolol (TENORMIN) 25 MG tablet TAKE 1 TABLET BY MOUTH EVERY DAY 90 tablet 1  . Cyanocobalamin (VITAMIN B 12) 250 MCG LOZG Take by mouth.    . ferrous sulfate 325 (65 FE) MG tablet Take 325 mg by mouth daily with breakfast.    . furosemide (LASIX) 20 MG tablet Take 1 tablet (20 mg total) by mouth daily. 30 tablet 3  . losartan (COZAAR) 100 MG tablet Take 1 tablet (100 mg total) by mouth daily. 90 tablet 0  . Multiple Vitamin (MULTIVITAMIN) capsule Take 1 capsule by  mouth daily.    . potassium chloride SA (K-DUR,KLOR-CON) 20 MEQ tablet Take 1 tablet (20 mEq total) by mouth daily. 30 tablet 3  . simvastatin (ZOCOR) 20 MG tablet Take 1 tablet (20 mg total) by mouth daily. 30 tablet 3   No current facility-administered medications on file prior to visit.       Review of Systems    see HPI Objective:   Physical Exam Physical Exam  Nursing note and vitals reviewed.  Constitutional: She is oriented to person, place, and time. She appears well-developed and well-nourished.  HENT:  Head: Normocephalic and atraumatic.  Cardiovascular: Normal rate and regular rhythm. Exam reveals no gallop and no friction rub.  No murmur heard.  Pulmonary/Chest: Breath sounds normal. She has no wheezes. She has no rales.  Neurological: She is alert and oriented to person, place, and  time.  Skin: Skin is warm and dry.  Psychiatric: She has a normal mood and affect. Her behavior is normal.          Assessment & Plan:  HTN continue meds  .  Advised to be sure to take all meds daily especially with history of aneurysm   Hyperlipidemia  Advised to take Zocor daily  Pt advised of my departure and she will be receiving letter soon with alternative options for PCP

## 2014-09-22 ENCOUNTER — Other Ambulatory Visit: Payer: Self-pay | Admitting: *Deleted

## 2014-09-22 ENCOUNTER — Telehealth: Payer: Self-pay | Admitting: *Deleted

## 2014-09-22 NOTE — Telephone Encounter (Signed)
Morgan Medina is asking if she can come off the Norvasc because she believes that it is causing the swelling in her legs and ankles. DShe said that she discuss the swelling with you at her last visit-eh

## 2014-09-22 NOTE — Telephone Encounter (Signed)
error 

## 2014-09-25 ENCOUNTER — Other Ambulatory Visit: Payer: Self-pay | Admitting: *Deleted

## 2014-09-25 ENCOUNTER — Telehealth: Payer: Self-pay | Admitting: Internal Medicine

## 2014-09-25 MED ORDER — POTASSIUM CHLORIDE CRYS ER 20 MEQ PO TBCR: 20.0000 meq | EXTENDED_RELEASE_TABLET | Freq: Every day | ORAL | Status: DC

## 2014-09-25 MED ORDER — SIMVASTATIN 20 MG PO TABS: 20.0000 mg | ORAL_TABLET | Freq: Every day | ORAL | Status: DC

## 2014-09-25 MED ORDER — ATENOLOL 25 MG PO TABS: ORAL_TABLET | ORAL | Status: DC

## 2014-09-25 MED ORDER — FUROSEMIDE 20 MG PO TABS: 20.0000 mg | ORAL_TABLET | Freq: Every day | ORAL | Status: DC

## 2014-09-25 NOTE — Telephone Encounter (Signed)
I spoke with Patient about her medications. She has a follow up appointment on April 26th -eh

## 2014-09-25 NOTE — Telephone Encounter (Signed)
Call pt and tell her she can come off the norvasc but in its place she will need to take two tablets of the Atenolol every day (instead of one Atenolol) and she will need to see me in 2 weeks.  Give 30 min OV in 2 weeks

## 2014-09-25 NOTE — Telephone Encounter (Signed)
Refill request

## 2014-09-27 ENCOUNTER — Ambulatory Visit
Admission: RE | Admit: 2014-09-27 | Discharge: 2014-09-27 | Disposition: A | Discharge status: Home / Self Care | Payer: Medicare PPO | Source: Ambulatory Visit | Attending: Internal Medicine | Admitting: Internal Medicine

## 2014-09-27 DIAGNOSIS — Z0000 Encounter for general adult medical examination without abnormal findings: Secondary | ICD-10-CM

## 2014-10-10 ENCOUNTER — Ambulatory Visit (INDEPENDENT_AMBULATORY_CARE_PROVIDER_SITE_OTHER): Payer: Medicare PPO | Admitting: Internal Medicine

## 2014-10-10 ENCOUNTER — Encounter: Payer: Self-pay | Admitting: Internal Medicine

## 2014-10-10 VITALS — BP 136/76 | HR 61 | Resp 16 | Ht 68.0 in | Wt 203.0 lb

## 2014-10-10 DIAGNOSIS — R609 Edema, unspecified: Secondary | ICD-10-CM

## 2014-10-10 DIAGNOSIS — E785 Hyperlipidemia, unspecified: Secondary | ICD-10-CM | POA: Diagnosis not present

## 2014-10-10 DIAGNOSIS — I10 Essential (primary) hypertension: Secondary | ICD-10-CM | POA: Diagnosis not present

## 2014-10-10 MED ORDER — AMLODIPINE BESYLATE 10 MG PO TABS: ORAL_TABLET | ORAL | Status: DC

## 2014-10-10 MED ORDER — FUROSEMIDE 20 MG PO TABS: ORAL_TABLET | ORAL | Status: DC

## 2014-10-10 MED ORDER — LOSARTAN POTASSIUM 100 MG PO TABS: ORAL_TABLET | ORAL | Status: DC

## 2014-10-10 MED ORDER — POTASSIUM CHLORIDE CRYS ER 20 MEQ PO TBCR: 20.0000 meq | EXTENDED_RELEASE_TABLET | Freq: Every day | ORAL | Status: DC

## 2014-10-10 NOTE — Progress Notes (Signed)
Subjective:    Patient ID: Morgan Medina, female    DOB: [birth year], 69 y.o.   MRN: [id number]  HPI 09/13/2014 note Assessment & Plan:  HTN continue meds . Advised to be sure to take all meds daily especially with history of aneurysm   Hyperlipidemia Advised to take Zocor daily  Pt advised of my departure and she will be receiving letter soon with alternative options for PCP          TODAY  Morgan Medina is here for follow up of BP.   She states she stopped her Norvasc for a few days due to LE edema but her BP went up and she restarted Norvasc again.   Other than Le Edema she is asymptomatic   She does change her medications on her own without discussing with me   No Known Allergies Past Medical History  Diagnosis Date  . Hypertension   . Hyperlipidemia   . Anemia    Past Surgical History  Procedure Laterality Date  . Abdominal hysterectomy    . Cerebral aneurysm repair     History   Social History  . Marital Status: Widowed    Spouse Name: N/A  . Number of Children: N/A  . Years of Education: N/A   Occupational History  . Not on file.   Social History Main Topics  . Smoking status: Never Smoker   . Smokeless tobacco: Never Used  . Alcohol Use: No  . Drug Use: No  . Sexual Activity: No   Other Topics Concern  . Not on file   Social History Narrative   Family History  Problem Relation Age of Onset  . Cerebral aneurysm Father   . Alzheimer's disease Mother    Patient Active Problem List   Diagnosis Date Noted  . Herpes zoster  history of facial zoster 05/02/2014  . Anemia 11/28/2013  . HTN (hypertension) 11/28/2013  . Hyperlipidemia 11/28/2013  . S/P abdominal supracervical subtotal hysterectomy 11/28/2013  . Colon polyps 11/28/2013  . Cerebral aneurysm  S/P coiling  [facility]  2006   11/28/2013  . Menopause 11/28/2013   Current Outpatient Prescriptions on File Prior to Visit  Medication Sig Dispense Refill  . albuterol (PROVENTIL HFA;VENTOLIN  HFA) 108 (90 BASE) MCG/ACT inhaler Inhale 2 inhaltions every 8 hours as needed for wheezing 1 Inhaler 0  . atenolol (TENORMIN) 25 MG tablet Take two tablets daily 180 tablet 1  . Cyanocobalamin (VITAMIN B 12) 250 MCG LOZG Take by mouth.    . ferrous sulfate 325 (65 FE) MG tablet Take 325 mg by mouth daily with breakfast.    . furosemide (LASIX) 20 MG tablet Take 1 tablet (20 mg total) by mouth daily. 90 tablet 2  . losartan (COZAAR) 100 MG tablet Take 1 tablet (100 mg total) by mouth daily. 90 tablet 0  . Multiple Vitamin (MULTIVITAMIN) capsule Take 1 capsule by mouth daily.    . potassium chloride SA (K-DUR,KLOR-CON) 20 MEQ tablet Take 1 tablet (20 mEq total) by mouth daily. 90 tablet 2  . simvastatin (ZOCOR) 20 MG tablet Take 1 tablet (20 mg total) by mouth daily. 90 tablet 1   No current facility-administered medications on file prior to visit.       Review of Systems See HPI    Objective:   Physical Exam  Physical Exam  Nursing note and vitals reviewed.  Constitutional: She is oriented to person, place, and time. She appears well-developed and well-nourished.  HENT:  Head: Normocephalic and  atraumatic.  Cardiovascular: Normal rate and regular rhythm. Exam reveals no gallop and no friction rub.  No murmur heard.  Pulmonary/Chest: Breath sounds normal. She has no wheezes. She has no rales.  Neurological: She is alert and oriented to person, place, and time.  Skin: Skin is warm and dry.  Ext 2 + edema  Psychiatric: She has a normal mood and affect. Her behavior is normal.             Assessment & Plan:  HTN  Continue Cozaar , norvasc and will increase her Lasix to 40 mg daily.  She can divide lasix into bid dosing if she wishes.  ADvised to be sure to take her K daily with her diuretic.  Repeat BP on my exam acceptable  LE edema  Likely due to norvasc but pt does tend to change meds on her own.  OK for increased lasix as above   Hyperlipidemia  On Zocor   S/P  coiling of cerebral aneurysm    Pt has received letter of my departure and advised to make new pt appt this week with PCP of choice

## 2014-10-25 ENCOUNTER — Telehealth: Payer: Self-pay | Admitting: *Deleted

## 2014-10-25 ENCOUNTER — Encounter: Payer: Self-pay | Admitting: *Deleted

## 2014-10-25 NOTE — Telephone Encounter (Signed)
Pre-Visit Call completed with patient and chart updated.   Pre-Visit Info documented in Specialty Comments under [employer].    

## 2014-10-26 ENCOUNTER — Ambulatory Visit (INDEPENDENT_AMBULATORY_CARE_PROVIDER_SITE_OTHER): Payer: Medicare PPO | Admitting: Family Medicine

## 2014-10-26 ENCOUNTER — Encounter: Payer: Self-pay | Admitting: Family Medicine

## 2014-10-26 VITALS — BP 142/80 | HR 79 | Temp 98.8°F | Ht 68.0 in | Wt 198.4 lb

## 2014-10-26 DIAGNOSIS — E669 Obesity, unspecified: Secondary | ICD-10-CM

## 2014-10-26 DIAGNOSIS — I10 Essential (primary) hypertension: Secondary | ICD-10-CM | POA: Diagnosis not present

## 2014-10-26 DIAGNOSIS — E782 Mixed hyperlipidemia: Secondary | ICD-10-CM | POA: Diagnosis not present

## 2014-10-26 DIAGNOSIS — D649 Anemia, unspecified: Secondary | ICD-10-CM

## 2014-10-26 DIAGNOSIS — E663 Overweight: Secondary | ICD-10-CM

## 2014-10-26 DIAGNOSIS — R739 Hyperglycemia, unspecified: Secondary | ICD-10-CM

## 2014-10-26 DIAGNOSIS — E1169 Type 2 diabetes mellitus with other specified complication: Secondary | ICD-10-CM

## 2014-10-26 DIAGNOSIS — E785 Hyperlipidemia, unspecified: Secondary | ICD-10-CM | POA: Diagnosis not present

## 2014-10-26 DIAGNOSIS — E119 Type 2 diabetes mellitus without complications: Secondary | ICD-10-CM

## 2014-10-26 DIAGNOSIS — I671 Cerebral aneurysm, nonruptured: Secondary | ICD-10-CM

## 2014-10-26 LAB — COMPREHENSIVE METABOLIC PANEL
ALT: 12 U/L (ref 0–35)
AST: 16 U/L (ref 0–37)
Albumin: 4.3 g/dL (ref 3.5–5.2)
Alkaline Phosphatase: 60 U/L (ref 39–117)
BUN: 14 mg/dL (ref 6–23)
CO2: 34 mEq/L — ABNORMAL HIGH (ref 19–32)
Calcium: 10.1 mg/dL (ref 8.4–10.5)
Chloride: 102 mEq/L (ref 96–112)
Creatinine, Ser: 0.77 mg/dL (ref 0.40–1.20)
GFR: 96.12 mL/min (ref >60.00)
Glucose, Bld: 109 mg/dL — ABNORMAL HIGH (ref 70–99)
Potassium: 3.7 mEq/L (ref 3.5–5.1)
Sodium: 142 mEq/L (ref 135–145)
Total Bilirubin: 0.3 mg/dL (ref 0.2–1.2)
Total Protein: 7.8 g/dL (ref 6.0–8.3)

## 2014-10-26 LAB — LIPID PANEL
Cholesterol: 196 mg/dL (ref 0–200)
HDL: 54.8 mg/dL (ref >39.00)
LDL Cholesterol: 113 mg/dL — ABNORMAL HIGH (ref 0–99)
NonHDL: 141.2
Total CHOL/HDL Ratio: 4
Triglycerides: 139 mg/dL (ref 0.0–149.0)
VLDL: 27.8 mg/dL (ref 0.0–40.0)

## 2014-10-26 LAB — CBC
HCT: 36.5 % (ref 36.0–46.0)
Hemoglobin: 12 g/dL (ref 12.0–15.0)
MCHC: 32.8 g/dL (ref 30.0–36.0)
MCV: 73 fl — ABNORMAL LOW (ref 78.0–100.0)
Platelets: 308 10*3/uL (ref 150.0–400.0)
RBC: 5.01 Mil/uL (ref 3.87–5.11)
RDW: 13.7 % (ref 11.5–15.5)
WBC: 9 10*3/uL (ref 4.0–10.5)

## 2014-10-26 LAB — TSH: TSH: 1.59 u[IU]/mL (ref 0.35–4.50)

## 2014-10-26 LAB — HEMOGLOBIN A1C: Hgb A1c MFr Bld: 6.8 % — ABNORMAL HIGH (ref 4.6–6.5)

## 2014-10-26 MED ORDER — AMLODIPINE BESYLATE 5 MG PO TABS: 5.0000 mg | ORAL_TABLET | Freq: Every day | ORAL | Status: DC

## 2014-10-26 MED ORDER — ATENOLOL 50 MG PO TABS: 50.0000 mg | ORAL_TABLET | Freq: Every day | ORAL | Status: DC

## 2014-10-26 NOTE — Assessment & Plan Note (Signed)
Tolerating statin, encouraged heart healthy diet, avoid trans fats, minimize simple carbs and saturated fats. Increase exercise as tolerated 

## 2014-10-26 NOTE — Assessment & Plan Note (Signed)
Long standing will check CBC today. Continue iron supplement and monitor

## 2014-10-26 NOTE — Patient Instructions (Addendum)
Increase to Atenolol 50 mg daily  Can drop the Amlodipine down from 5 mg to 2.5 mg if the edema persists and the BP is doing well     Hypertension Hypertension, commonly called high blood pressure, is when the force of blood pumping through your arteries is too strong. Your arteries are the blood vessels that carry blood from your heart throughout your body. A blood pressure reading consists of a higher number over a lower number, such as 110/72. The higher number (systolic) is the pressure inside your arteries when your heart pumps. The lower number (diastolic) is the pressure inside your arteries when your heart relaxes. Ideally you want your blood pressure below 120/80. Hypertension forces your heart to work harder to pump blood. Your arteries may become narrow or stiff. Having hypertension puts you at risk for heart disease, stroke, and other problems.  RISK FACTORS Some risk factors for high blood pressure are controllable. Others are not.  Risk factors you cannot control include:   Race. You may be at higher risk if you are African American.  Age. Risk increases with age.  Gender. Men are at higher risk than women before age 56 years. After age 42, women are at higher risk than men. Risk factors you can control include:  Not getting enough exercise or physical activity.  Being overweight.  Getting too much fat, sugar, calories, or salt in your diet.  Drinking too much alcohol. SIGNS AND SYMPTOMS Hypertension does not usually cause signs or symptoms. Extremely high blood pressure (hypertensive crisis) may cause headache, anxiety, shortness of breath, and nosebleed. DIAGNOSIS  To check if you have hypertension, your health care provider will measure your blood pressure while you are seated, with your arm held at the level of your heart. It should be measured at least twice using the same arm. Certain conditions can cause a difference in blood pressure between your right and left  arms. A blood pressure reading that is higher than normal on one occasion does not mean that you need treatment. If one blood pressure reading is high, ask your health care provider about having it checked again. TREATMENT  Treating high blood pressure includes making lifestyle changes and possibly taking medicine. Living a healthy lifestyle can help lower high blood pressure. You may need to change some of your habits. Lifestyle changes may include:  Following the DASH diet. This diet is high in fruits, vegetables, and whole grains. It is low in salt, red meat, and added sugars.  Getting at least 2 hours of brisk physical activity every week.  Losing weight if necessary.  Not smoking.  Limiting alcoholic beverages.  Learning ways to reduce stress. If lifestyle changes are not enough to get your blood pressure under control, your health care provider may prescribe medicine. You may need to take more than one. Work closely with your health care provider to understand the risks and benefits. HOME CARE INSTRUCTIONS  Have your blood pressure rechecked as directed by your health care provider.   Take medicines only as directed by your health care provider. Follow the directions carefully. Blood pressure medicines must be taken as prescribed. The medicine does not work as well when you skip doses. Skipping doses also puts you at risk for problems.   Do not smoke.   Monitor your blood pressure at home as directed by your health care provider. SEEK MEDICAL CARE IF:   You think you are having a reaction to medicines taken.  You have  recurrent headaches or feel dizzy.  You have swelling in your ankles.  You have trouble with your vision. SEEK IMMEDIATE MEDICAL CARE IF:  You develop a severe headache or confusion.  You have unusual weakness, numbness, or feel faint.  You have severe chest or abdominal pain.  You vomit repeatedly.  You have trouble breathing. MAKE SURE YOU:     Understand these instructions.  Will watch your condition.  Will get help right away if you are not doing well or get worse. Document Released: 06/02/2005 Document Revised: 10/17/2013 Document Reviewed: 03/25/2013 [facility] Patient Information 2015 [place], [place]. This information is not intended to replace advice given to you by your health care provider. Make sure you discuss any questions you have with your health care provider.

## 2014-10-26 NOTE — Assessment & Plan Note (Signed)
Patient struggling with edema on Amlodipine will drop to 5 mg daily if edema not improved but pressure does not spike then drop to 2.5 mg, increase Atenolol to 50 mg and continue Losartan at 100 mg daily. Minimize sodium and caffeine

## 2014-10-26 NOTE — Assessment & Plan Note (Signed)
No recurrence since episode in 2006

## 2014-10-26 NOTE — Progress Notes (Signed)
Pre visit review using our clinic review tool, if applicable. No additional management support is needed unless otherwise documented below in the visit note. 

## 2014-10-26 NOTE — Assessment & Plan Note (Signed)
Encouraged DASH diet, decrease po intake and increase exercise as tolerated. Needs 7-8 hours of sleep nightly. Avoid trans fats, eat small, frequent meals every 4-5 hours with lean proteins, complex carbs and healthy fats. Minimize simple carbs, GMO foods. 

## 2014-10-31 ENCOUNTER — Telehealth: Payer: Self-pay | Admitting: Family Medicine

## 2014-10-31 ENCOUNTER — Other Ambulatory Visit: Payer: Self-pay | Admitting: Family Medicine

## 2014-10-31 MED ORDER — ONETOUCH BASIC SYSTEM W/DEVICE KIT: PACK | Status: DC

## 2014-10-31 MED ORDER — ONETOUCH LANCETS MISC: Status: AC

## 2014-10-31 MED ORDER — GLUCOSE BLOOD VI STRP: ORAL_STRIP | Status: DC

## 2014-10-31 NOTE — Telephone Encounter (Signed)
Relation to pt: self  Call back number: [phone] Pharmacy:[phone]  Reason for call:  As per pt pharmacy informed insurance will not cover Blood Glucose Monitoring Suppl ([place]) W/DEVICE KIT. Pt will call insurance and find out what machine they will cover and will call back with information. Pt wanted to make MD aware.

## 2014-10-31 NOTE — Telephone Encounter (Signed)
Cell [phone] Pt stated called insurance and was oriented that [facility] can give her a free Blood Glucose Monitor but the call was cut off and did not receive the complete info for the monitor, pt wants to know if we can sent the request for monitor to [facility] and have it done that way so she can receive it before her appt on Thursday, if not to reschedule her appt on Thursday. Please advise.

## 2014-10-31 NOTE — Telephone Encounter (Signed)
Called the patient back and she is going to call [employer] once again tomorrow to try and get the name of an approved meter/strips/lancets.  She then will call our office with the information in order that we can send in to her local pharmcy.  She does have a nurse visit appt. On Thursday to be instructed on how to use the meter.

## 2014-10-31 NOTE — Telephone Encounter (Signed)
Diabetic testing supplies sent in to Local pharmacy per Dr. Charlett Blake instructions from lab results.

## 2014-11-01 MED ORDER — ONETOUCH BASIC SYSTEM W/DEVICE KIT: PACK | Status: AC

## 2014-11-01 NOTE — Telephone Encounter (Signed)
New Rx sent to [facility].

## 2014-11-01 NOTE — Telephone Encounter (Signed)
Caller name:Morgan Medina Relationship to patient:self Can be reached: [place]: [place] [phone]  Reason for call:  Pt spoke with [facility] and original order for Blood Glucose Monitoring Suppl ([place]) W/DEVICE KIT will be covered at [facility] listed above.

## 2014-11-02 ENCOUNTER — Ambulatory Visit (INDEPENDENT_AMBULATORY_CARE_PROVIDER_SITE_OTHER): Payer: Medicare PPO | Admitting: *Deleted

## 2014-11-02 DIAGNOSIS — R739 Hyperglycemia, unspecified: Secondary | ICD-10-CM

## 2014-11-02 NOTE — Progress Notes (Signed)
Pre visit review using our clinic review tool, if applicable. No additional management support is needed unless otherwise documented below in the visit note.  Patient demonstrated correct use of meter- patient checked her blood sugar (122) successfully.   Patient stated signs of hypoglycemia and hyperglycemia, when to check sugars, appropriate sugar ranges, and when to call a doctor.   Patient states she will keep records of daily blood sugars for follow-up.   Patient given OneTouch Ultra Mini meter  She would like further refills of strips and lancets sent to [id number] - [place], [place] - [address] AT [address]OSS FROM [address]

## 2014-11-05 ENCOUNTER — Encounter: Payer: Self-pay | Admitting: Family Medicine

## 2014-11-05 DIAGNOSIS — E669 Obesity, unspecified: Secondary | ICD-10-CM

## 2014-11-05 DIAGNOSIS — E1169 Type 2 diabetes mellitus with other specified complication: Secondary | ICD-10-CM | POA: Insufficient documentation

## 2014-11-05 HISTORY — DX: Type 2 diabetes mellitus with other specified complication: E11.69

## 2014-11-05 HISTORY — DX: Type 2 diabetes mellitus with other specified complication: E66.9

## 2014-11-05 NOTE — Assessment & Plan Note (Signed)
hgba1c acceptable, minimize simple carbs. Increase exercise as tolerated. Continue current meds 

## 2014-11-05 NOTE — Progress Notes (Signed)
Morgan Medina  [id number] 1946/03/25 11/05/2014      Progress Note-Follow Up  Subjective  Chief Complaint  Chief Complaint  Patient presents with  . Establish Care    HPI  Patient is a 69 y.o. female in today for routine medical care. Patient is in today to establish care. No recent illness but has only been taking her atenolol once daily. She is frustrated regarding increased swelling in her feet and legs most notable in the evening. No other acute concerns or recent illness. Denies CP/palp/SOB/HA/congestion/fevers/GI or GU c/o. Taking meds as prescribed  Past Medical History  Diagnosis Date  . Hypertension   . Hyperlipidemia   . Anemia   . Overweight   . Cerebral aneurysm     h/o in 2006  . Diabetes mellitus type 2 in obese 11/05/2014    Past Surgical History  Procedure Laterality Date  . Abdominal hysterectomy    . Cerebral aneurysm repair      Family History  Problem Relation Age of Onset  . Cerebral aneurysm Father   . Alzheimer's disease Mother   . Cancer Maternal Aunt     breast  . Diabetes Maternal Grandmother   . Stroke Maternal Grandfather   . Alzheimer's disease Paternal Grandmother   . Stroke Paternal Grandfather     History   Social History  . Marital Status: Widowed    Spouse Name: N/A  . Number of Children: N/A  . Years of Education: N/A   Occupational History  . Not on file.   Social History Main Topics  . Smoking status: Never Smoker   . Smokeless tobacco: Never Used  . Alcohol Use: No  . Drug Use: No  . Sexual Activity: No     Comment: lives with self, no dietary restrictions. works as a Theme park manager, retired from state employment   Other Topics Concern  . Not on file   Social History Narrative    Current Outpatient Prescriptions on File Prior to Visit  Medication Sig Dispense Refill  . Cyanocobalamin (VITAMIN B 12) 250 MCG LOZG Take by mouth.    . ferrous sulfate 325 (65 FE) MG tablet Take 325 mg by mouth daily with breakfast.     . furosemide (LASIX) 20 MG tablet Take two tablets every morning 180 tablet 2  . losartan (COZAAR) 100 MG tablet Take one tablet every am . 90 tablet 1  . Multiple Vitamin (MULTIVITAMIN) capsule Take 1 capsule by mouth daily.    . potassium chloride SA (K-DUR,KLOR-CON) 20 MEQ tablet Take 1 tablet (20 mEq total) by mouth daily. 90 tablet 2  . simvastatin (ZOCOR) 20 MG tablet Take 1 tablet (20 mg total) by mouth daily. 90 tablet 1   No current facility-administered medications on file prior to visit.    No Known Allergies  Review of Systems  Review of Systems  Constitutional: Negative for fever, chills and malaise/fatigue.  HENT: Negative for congestion, hearing loss and nosebleeds.   Eyes: Negative for discharge.  Respiratory: Negative for cough, sputum production, shortness of breath and wheezing.   Cardiovascular: Positive for leg swelling. Negative for chest pain and palpitations.  Gastrointestinal: Negative for heartburn, nausea, vomiting, abdominal pain, diarrhea, constipation and blood in stool.  Genitourinary: Negative for dysuria, urgency, frequency and hematuria.  Musculoskeletal: Negative for myalgias, back pain and falls.  Skin: Negative for rash.  Neurological: Negative for dizziness, tremors, sensory change, focal weakness, loss of consciousness, weakness and headaches.  Endo/Heme/Allergies: Negative for polydipsia. Does not  bruise/bleed easily.  Psychiatric/Behavioral: Negative for depression and suicidal ideas. The patient is not nervous/anxious and does not have insomnia.     Objective  BP 142/80 mmHg  Pulse 79  Temp(Src) 98.8 F (37.1 C) (Oral)  Ht 5\' 8"  (1.727 m)  Wt 198 lb 6 oz (89.982 kg)  BMI 30.17 kg/m2  SpO2 94%  Physical Exam  Physical Exam  Constitutional: She is oriented to person, place, and time and well-developed, well-nourished, and in no distress. No distress.  HENT:  Head: Normocephalic and atraumatic.  Right Ear: External ear normal.    Left Ear: External ear normal.  Nose: Nose normal.  Mouth/Throat: Oropharynx is clear and moist. No oropharyngeal exudate.  Eyes: Conjunctivae are normal. Pupils are equal, round, and reactive to light. Right eye exhibits no discharge. Left eye exhibits no discharge. No scleral icterus.  Neck: Normal range of motion. Neck supple. No thyromegaly present.  Cardiovascular: Normal rate, regular rhythm, normal heart sounds and intact distal pulses.   No murmur heard. Pulmonary/Chest: Effort normal and breath sounds normal. No respiratory distress. She has no wheezes. She has no rales.  Abdominal: Soft. Bowel sounds are normal. She exhibits no distension and no mass. There is no tenderness.  Musculoskeletal: Normal range of motion. She exhibits edema. She exhibits no tenderness.  Trace pedal edema b/l LE   Lymphadenopathy:    She has no cervical adenopathy.  Neurological: She is alert and oriented to person, place, and time. She has normal reflexes. No cranial nerve deficit. Coordination normal.  Skin: Skin is warm and dry. No rash noted. She is not diaphoretic.  Psychiatric: Mood, memory and affect normal.    Lab Results  Component Value Date   TSH 1.59 10/26/2014   Lab Results  Component Value Date   WBC 9.0 10/26/2014   HGB 12.0 10/26/2014   HCT 36.5 10/26/2014   MCV 73.0* 10/26/2014   PLT 308.0 10/26/2014   Lab Results  Component Value Date   CREATININE 0.77 10/26/2014   BUN 14 10/26/2014   NA 142 10/26/2014   K 3.7 10/26/2014   CL 102 10/26/2014   CO2 34* 10/26/2014   Lab Results  Component Value Date   ALT 12 10/26/2014   AST 16 10/26/2014   ALKPHOS 60 10/26/2014   BILITOT 0.3 10/26/2014   Lab Results  Component Value Date   CHOL 196 10/26/2014   Lab Results  Component Value Date   HDL 54.80 10/26/2014   Lab Results  Component Value Date   LDLCALC 113* 10/26/2014   Lab Results  Component Value Date   TRIG 139.0 10/26/2014   Lab Results  Component Value  Date   CHOLHDL 4 10/26/2014     Assessment & Plan  HTN (hypertension) Patient struggling with edema on Amlodipine will drop to 5 mg daily if edema not improved but pressure does not spike then drop to 2.5 mg, increase Atenolol to 50 mg and continue Losartan at 100 mg daily. Minimize sodium and caffeine   Hyperlipidemia Tolerating statin, encouraged heart healthy diet, avoid trans fats, minimize simple carbs and saturated fats. Increase exercise as tolerated   Anemia Long standing will check CBC today. Continue iron supplement and monitor   Cerebral aneurysm  S/P coiling  [facility]  2006   No recurrence since episode in 2006   Overweight Encouraged DASH diet, decrease po intake and increase exercise as tolerated. Needs 7-8 hours of sleep nightly. Avoid trans fats, eat small, frequent meals every 4-5 hours  with lean proteins, complex carbs and healthy fats. Minimize simple carbs, GMO foods.   Diabetes mellitus type 2 in obese hgba1c acceptable, minimize simple carbs. Increase exercise as tolerated. Continue current meds

## 2014-11-22 ENCOUNTER — Telehealth: Payer: Self-pay | Admitting: Family Medicine

## 2014-11-22 NOTE — Telephone Encounter (Signed)
Relation to pt: self  Call back number: [phone] Pharmacy: [place] [phone]  Reason for call:   Pt requesting glucose blood test strip please send to  [facility] [place], [place] [phone] (Phone) [phone] (Fax)

## 2014-11-23 ENCOUNTER — Other Ambulatory Visit: Payer: Self-pay | Admitting: Family Medicine

## 2014-11-23 MED ORDER — GLUCOSE BLOOD VI STRP: ORAL_STRIP | Status: DC

## 2014-11-23 NOTE — Telephone Encounter (Signed)
Strips sent in and patient notified.

## 2014-11-27 MED ORDER — GLUCOSE BLOOD VI STRP: ORAL_STRIP | Status: DC

## 2014-11-27 NOTE — Telephone Encounter (Signed)
Caller name: Margo Lama Relationship to patient: self Can be reached: [phone] Pharmacy: [facility] [address] [facility]  Reason for call: Pt got wrong test strips. She has a one touch ultra mini meter. Please sent RX for correct strips.

## 2014-11-27 NOTE — Telephone Encounter (Signed)
Caller name: Relationship to patient: Can be reached: [phone] Pharmacy:  Reason for call:

## 2014-11-27 NOTE — Addendum Note (Signed)
Addended by: Sharon Seller B on: 11/27/2014 11:35 AM   Modules accepted: Orders, Medications

## 2014-11-27 NOTE — Telephone Encounter (Signed)
Strips corrected and patient informed

## 2014-11-28 ENCOUNTER — Telehealth: Payer: Self-pay | Admitting: Family Medicine

## 2014-11-28 NOTE — Telephone Encounter (Signed)
Relation to pt: self  Call back number: [phone]   Reason for call:   Pt states insurance will not cover  "vergo glucose blood test strip" therefore a new script was sent "ultra mini glucose blood test strip" and insurance will not pay because there stating a rx was already sent. Pharmacy suggested office calling insurance requesting a override [facility] insurance # [id number]

## 2014-11-30 NOTE — Telephone Encounter (Signed)
This is not for a PA, it for clarification as to which strip pt needs.

## 2014-11-30 NOTE — Telephone Encounter (Signed)
Contacted the patient and she is going to contact [employer] to check if they can just contact our office by fax for overrride.

## 2014-12-04 ENCOUNTER — Other Ambulatory Visit: Payer: Self-pay | Admitting: Family Medicine

## 2014-12-04 MED ORDER — GLUCOSE BLOOD VI STRP: ORAL_STRIP | Status: DC

## 2015-01-25 ENCOUNTER — Encounter: Payer: Self-pay | Admitting: Family Medicine

## 2015-01-25 ENCOUNTER — Ambulatory Visit (INDEPENDENT_AMBULATORY_CARE_PROVIDER_SITE_OTHER): Payer: Medicare PPO | Admitting: Family Medicine

## 2015-01-25 VITALS — BP 132/80 | HR 62 | Temp 97.9°F | Ht 68.0 in | Wt 200.0 lb

## 2015-01-25 DIAGNOSIS — E785 Hyperlipidemia, unspecified: Secondary | ICD-10-CM | POA: Diagnosis not present

## 2015-01-25 DIAGNOSIS — E663 Overweight: Secondary | ICD-10-CM

## 2015-01-25 DIAGNOSIS — E119 Type 2 diabetes mellitus without complications: Secondary | ICD-10-CM | POA: Diagnosis not present

## 2015-01-25 DIAGNOSIS — E1169 Type 2 diabetes mellitus with other specified complication: Secondary | ICD-10-CM

## 2015-01-25 DIAGNOSIS — D508 Other iron deficiency anemias: Secondary | ICD-10-CM

## 2015-01-25 DIAGNOSIS — I10 Essential (primary) hypertension: Secondary | ICD-10-CM | POA: Diagnosis not present

## 2015-01-25 DIAGNOSIS — E669 Obesity, unspecified: Secondary | ICD-10-CM

## 2015-01-25 MED ORDER — LOSARTAN POTASSIUM 100 MG PO TABS: ORAL_TABLET | ORAL | Status: DC

## 2015-01-25 MED ORDER — SIMVASTATIN 20 MG PO TABS: 20.0000 mg | ORAL_TABLET | Freq: Every day | ORAL | Status: DC

## 2015-01-25 NOTE — Assessment & Plan Note (Signed)
Well controlled, no changes to meds. Encouraged heart healthy diet such as the DASH diet and exercise as tolerated.  °

## 2015-01-25 NOTE — Progress Notes (Signed)
Patient ID: Morgan Medina, female   DOB: 23-Mar-1946, 69 y.o.   MRN: [id number]   Morgan Medina  female [id number] [birth year] 69 y.o. 01/25/2015      Progress Note-Follow Up  Subjective   HPI  Patient is in today for follow up on numerous concerns. No recent illness, no acute concersn. Has been very busy but no new stressors. Was feeling light headed on higher dose of Amlodipine but feels better now with the lower dose. Denies CP/palp/SOB/HA/congestion/fevers/GI or GU c/o. Taking meds as prescribed Chief Complaint  Patient presents with  . Follow-up    3 month    Past Medical History  Diagnosis Date  . Hypertension   . Hyperlipidemia   . Anemia   . Overweight   . Cerebral aneurysm     h/o in 2006  . Diabetes mellitus type 2 in obese 11/05/2014    Past Surgical History  Procedure Laterality Date  . Abdominal hysterectomy    . Cerebral aneurysm repair      Family History  Problem Relation Age of Onset  . Cerebral aneurysm Father   . Alzheimer's disease Mother   . Cancer Maternal Aunt     breast  . Diabetes Maternal Grandmother   . Stroke Maternal Grandfather   . Alzheimer's disease Paternal Grandmother   . Stroke Paternal Grandfather     Social History   Social History  . Marital Status: Widowed    Spouse Name: N/A  . Number of Children: N/A  . Years of Education: N/A   Occupational History  . Not on file.   Social History Main Topics  . Smoking status: Never Smoker   . Smokeless tobacco: Never Used  . Alcohol Use: No  . Drug Use: No  . Sexual Activity: No     Comment: lives with self, no dietary restrictions. works as a Theme park manager, retired from state employment   Other Topics Concern  . Not on file   Social History Narrative    Current Outpatient Prescriptions on File Prior to Visit  Medication Sig Dispense Refill  . amLODipine (NORVASC) 5 MG tablet Take 1 tablet (5 mg total) by mouth daily. 90 tablet 3  . aspirin 81 MG tablet Takes every other day     . atenolol (TENORMIN) 50 MG tablet Take 1 tablet (50 mg total) by mouth daily. 90 tablet 3  . Blood Glucose Monitoring Suppl ([place]) W/DEVICE KIT Use to check blood sugar. DX. E11.9 1 each 0  . Cyanocobalamin (VITAMIN B 12) 250 MCG LOZG Take by mouth.    . ferrous sulfate 325 (65 FE) MG tablet Take 325 mg by mouth daily with breakfast.    . furosemide (LASIX) 20 MG tablet Take two tablets every morning 180 tablet 2  . glucose blood (ONE TOUCH ULTRA TEST) test strip Use as idirected once daily.  DX E11.9 100 each 6  . glucose blood test strip Test once daily to check blood sugar.  DX E11.9 100 each 11  . losartan (COZAAR) 100 MG tablet Take one tablet every am . 90 tablet 1  . Multiple Vitamin (MULTIVITAMIN) capsule Take 1 capsule by mouth daily.    . ONE TOUCH LANCETS MISC Use as directed once daily to check blood sugar. DX: E11.9 100 each 0  . potassium chloride SA (K-DUR,KLOR-CON) 20 MEQ tablet Take 1 tablet (20 mEq total) by mouth daily. 90 tablet 2   No current facility-administered medications on file prior to visit.  No Known Allergies  Review of Systems  Review of Systems  Constitutional: Negative for fever and malaise/fatigue.  HENT: Negative for congestion.   Eyes: Negative for discharge.  Respiratory: Negative for shortness of breath.   Cardiovascular: Positive for leg swelling. Negative for chest pain and palpitations.  Gastrointestinal: Negative for nausea and abdominal pain.  Genitourinary: Negative for dysuria.  Musculoskeletal: Negative for falls.  Skin: Negative for rash.  Neurological: Negative for loss of consciousness and headaches.  Endo/Heme/Allergies: Negative for environmental allergies.  Psychiatric/Behavioral: Negative for depression. The patient is not nervous/anxious.     Objective  Filed Vitals:   01/25/15 1016  BP: 142/92  Pulse: 62  Temp: 97.9 F (36.6 C)  TempSrc: Oral  Height: '5\' 8"'  (1.727 m)  Weight: 200 lb (90.719 kg)   SpO2: 93%   Body mass index is 30.42 kg/(m^2).  Physical Exam  Physical Exam  Lab Results  Component Value Date   TSH 1.59 10/26/2014   Lab Results  Component Value Date   WBC 9.0 10/26/2014   HGB 12.0 10/26/2014   HCT 36.5 10/26/2014   MCV 73.0* 10/26/2014   PLT 308.0 10/26/2014   Lab Results  Component Value Date   GFR 96.12 10/26/2014   Lab Results  Component Value Date   CHOL 196 10/26/2014   Lab Results  Component Value Date   HDL 54.80 10/26/2014   Lab Results  Component Value Date   LDLCALC 113* 10/26/2014   Lab Results  Component Value Date   TRIG 139.0 10/26/2014   Lab Results  Component Value Date   CHOLHDL 4 10/26/2014   Lab Results  Component Value Date   HGBA1C 6.8* 10/26/2014      Assessment & Plan  HTN (hypertension) Well controlled, no changes to meds. Encouraged heart healthy diet such as the DASH diet and exercise as tolerated.   Overweight Encouraged DASH diet, decrease po intake and increase exercise as tolerated. Needs 7-8 hours of sleep nightly. Avoid trans fats, eat small, frequent meals every 4-5 hours with lean proteins, complex carbs and healthy fats. Minimize simple carbs  Diabetes mellitus type 2 in obese hgba1c acceptable, minimize simple carbs. Increase exercise as tolerated. Continue current meds  Anemia Resolved, Increase leafy greens, consider increased lean red meat and using cast iron cookware. Continue to monitor, report any concerns  Hyperlipidemia Tolerating statin, encouraged heart healthy diet, avoid trans fats, minimize simple carbs and saturated fats. Increase exercise as tolerated

## 2015-01-25 NOTE — Progress Notes (Signed)
Pre visit review using our clinic review tool, if applicable. No additional management support is needed unless otherwise documented below in the visit note. 

## 2015-01-25 NOTE — Patient Instructions (Addendum)
Hypertension Hypertension, commonly called high blood pressure, is when the force of blood pumping through your arteries is too strong. Your arteries are the blood vessels that carry blood from your heart throughout your body. A blood pressure reading consists of a higher number over a lower number, such as 110/72. The higher number (systolic) is the pressure inside your arteries when your heart pumps. The lower number (diastolic) is the pressure inside your arteries when your heart relaxes. Ideally you want your blood pressure below 120/80. Hypertension forces your heart to work harder to pump blood. Your arteries may become narrow or stiff. Having hypertension puts you at risk for heart disease, stroke, and other problems.  RISK FACTORS Some risk factors for high blood pressure are controllable. Others are not.  Risk factors you cannot control include:   Race. You may be at higher risk if you are African American.  Age. Risk increases with age.  Gender. Men are at higher risk than women before age 45 years. After age 65, women are at higher risk than men. Risk factors you can control include:  Not getting enough exercise or physical activity.  Being overweight.  Getting too much fat, sugar, calories, or salt in your diet.  Drinking too much alcohol. SIGNS AND SYMPTOMS Hypertension does not usually cause signs or symptoms. Extremely high blood pressure (hypertensive crisis) may cause headache, anxiety, shortness of breath, and nosebleed. DIAGNOSIS  To check if you have hypertension, your health care provider will measure your blood pressure while you are seated, with your arm held at the level of your heart. It should be measured at least twice using the same arm. Certain conditions can cause a difference in blood pressure between your right and left arms. A blood pressure reading that is higher than normal on one occasion does not mean that you need treatment. If one blood pressure reading  is high, ask your health care provider about having it checked again. TREATMENT  Treating high blood pressure includes making lifestyle changes and possibly taking medicine. Living a healthy lifestyle can help lower high blood pressure. You may need to change some of your habits. Lifestyle changes may include:  Following the DASH diet. This diet is high in fruits, vegetables, and whole grains. It is low in salt, red meat, and added sugars.  Getting at least 2 hours of brisk physical activity every week.  Losing weight if necessary.  Not smoking.  Limiting alcoholic beverages.  Learning ways to reduce stress. If lifestyle changes are not enough to get your blood pressure under control, your health care provider may prescribe medicine. You may need to take more than one. Work closely with your health care provider to understand the risks and benefits. HOME CARE INSTRUCTIONS  Have your blood pressure rechecked as directed by your health care provider.   Take medicines only as directed by your health care provider. Follow the directions carefully. Blood pressure medicines must be taken as prescribed. The medicine does not work as well when you skip doses. Skipping doses also puts you at risk for problems.   Do not smoke.   Monitor your blood pressure at home as directed by your health care provider. SEEK MEDICAL CARE IF:   You think you are having a reaction to medicines taken.  You have recurrent headaches or feel dizzy.  You have swelling in your ankles.  You have trouble with your vision. SEEK IMMEDIATE MEDICAL CARE IF:  You develop a severe headache or confusion.    You have unusual weakness, numbness, or feel faint.  You have severe chest or abdominal pain.  You vomit repeatedly.  You have trouble breathing. MAKE SURE YOU:   Understand these instructions.  Will watch your condition.  Will get help right away if you are not doing well or get worse. Document  Released: 06/02/2005 Document Revised: 10/17/2013 Document Reviewed: 03/25/2013 ExitCare Patient Information 2015 [employer]. This information is not intended to replace advice given to you by your health care provider. Make sure you discuss any questions you have with your health care provider. Basic Carbohydrate Counting for Diabetes Mellitus Carbohydrate counting is a method for keeping track of the amount of carbohydrates you eat. Eating carbohydrates naturally increases the level of sugar (glucose) in your blood, so it is important for you to know the amount that is okay for you to have in every meal. Carbohydrate counting helps keep the level of glucose in your blood within normal limits. The amount of carbohydrates allowed is different for every person. A dietitian can help you calculate the amount that is right for you. Once you know the amount of carbohydrates you can have, you can count the carbohydrates in the foods you want to eat. Carbohydrates are found in the following foods:  Grains, such as breads and cereals.  Dried beans and soy products.  Starchy vegetables, such as potatoes, peas, and corn.  Fruit and fruit juices.  Milk and yogurt.  Sweets and snack foods, such as cake, cookies, candy, chips, soft drinks, and fruit drinks. CARBOHYDRATE COUNTING There are two ways to count the carbohydrates in your food. You can use either of the methods or a combination of both. Reading the "Nutrition Facts" on Packaged Food The "Nutrition Facts" is an area that is included on the labels of almost all packaged food and beverages in the [place]. It includes the serving size of that food or beverage and information about the nutrients in each serving of the food, including the grams (g) of carbohydrate per serving.  Decide the number of servings of this food or beverage that you will be able to eat or drink. Multiply that number of servings by the number of grams of carbohydrate  that is listed on the label for that serving. The total will be the amount of carbohydrates you will be having when you eat or drink this food or beverage. Learning Standard Serving Sizes of Food When you eat food that is not packaged or does not include "Nutrition Facts" on the label, you need to measure the servings in order to count the amount of carbohydrates.A serving of most carbohydrate-rich foods contains about 15 g of carbohydrates. The following list includes serving sizes of carbohydrate-rich foods that provide 15 g ofcarbohydrate per serving:   1 slice of bread (1 oz) or 1 six-inch tortilla.    of a hamburger bun or English muffin.  4-6 crackers.   cup unsweetened dry cereal.    cup hot cereal.   cup rice or pasta.    cup mashed potatoes or  of a large baked potato.  1 cup fresh fruit or one small piece of fruit.    cup canned or frozen fruit or fruit juice.  1 cup milk.   cup plain fat-free yogurt or yogurt sweetened with artificial sweeteners.   cup cooked dried beans or starchy vegetable, such as peas, corn, or potatoes.  Decide the number of standard-size servings that you will eat. Multiply that number of servings   by 15 (the grams of carbohydrates in that serving). For example, if you eat 2 cups of strawberries, you will have eaten 2 servings and 30 g of carbohydrates (2 servings x 15 g = 30 g). For foods such as soups and casseroles, in which more than one food is mixed in, you will need to count the carbohydrates in each food that is included. EXAMPLE OF CARBOHYDRATE COUNTING Sample Dinner  3 oz chicken breast.   cup of brown rice.   cup of corn.  1 cup milk.   1 cup strawberries with sugar-free whipped topping.  Carbohydrate Calculation Step 1: Identify the foods that contain carbohydrates:   Rice.   Corn.   Milk.   Strawberries. Step 2:Calculate the number of servings eaten of each:   2 servings of rice.   1 serving  of corn.   1 serving of milk.   1 serving of strawberries. Step 3: Multiply each of those number of servings by 15 g:   2 servings of rice x 15 g = 30 g.   1 serving of corn x 15 g = 15 g.   1 serving of milk x 15 g = 15 g.   1 serving of strawberries x 15 g = 15 g. Step 4: Add together all of the amounts to find the total grams of carbohydrates eaten: 30 g + 15 g + 15 g + 15 g = 75 g. Document Released: 06/02/2005 Document Revised: 10/17/2013 Document Reviewed: 04/29/2013 ExitCare Patient Information 2015 [employer]. This information is not intended to replace advice given to you by your health care provider. Make sure you discuss any questions you have with your health care provider.  

## 2015-02-11 NOTE — Assessment & Plan Note (Signed)
Encouraged DASH diet, decrease po intake and increase exercise as tolerated. Needs 7-8 hours of sleep nightly. Avoid trans fats, eat small, frequent meals every 4-5 hours with lean proteins, complex carbs and healthy fats. Minimize simple carbs 

## 2015-02-11 NOTE — Assessment & Plan Note (Signed)
hgba1c acceptable, minimize simple carbs. Increase exercise as tolerated. Continue current meds 

## 2015-02-11 NOTE — Assessment & Plan Note (Signed)
Tolerating statin, encouraged heart healthy diet, avoid trans fats, minimize simple carbs and saturated fats. Increase exercise as tolerated 

## 2015-02-11 NOTE — Assessment & Plan Note (Signed)
Resolved, Increase leafy greens, consider increased lean red meat and using cast iron cookware. Continue to monitor, report any concerns

## 2015-03-05 ENCOUNTER — Telehealth: Payer: Self-pay | Admitting: Family Medicine

## 2015-03-05 MED ORDER — FUROSEMIDE 20 MG PO TABS: ORAL_TABLET | ORAL | Status: DC

## 2015-03-05 NOTE — Telephone Encounter (Signed)
Relation to DX:IPJA Call back Mora: [id number] - [place], [place] - [address] [phone] (Phone) [phone] (Fax)         Reason for call:  Patient requesting a refill furosemide (LASIX) 20 MG tablet and would like to discuss MG. Marland Kitchen

## 2015-03-05 NOTE — Telephone Encounter (Signed)
Sent in refill as requested and patient informed.

## 2015-05-01 ENCOUNTER — Other Ambulatory Visit (INDEPENDENT_AMBULATORY_CARE_PROVIDER_SITE_OTHER): Payer: Medicare PPO

## 2015-05-01 DIAGNOSIS — E785 Hyperlipidemia, unspecified: Secondary | ICD-10-CM

## 2015-05-01 DIAGNOSIS — E669 Obesity, unspecified: Secondary | ICD-10-CM | POA: Diagnosis not present

## 2015-05-01 DIAGNOSIS — D508 Other iron deficiency anemias: Secondary | ICD-10-CM | POA: Diagnosis not present

## 2015-05-01 DIAGNOSIS — E1169 Type 2 diabetes mellitus with other specified complication: Secondary | ICD-10-CM

## 2015-05-01 DIAGNOSIS — E119 Type 2 diabetes mellitus without complications: Secondary | ICD-10-CM

## 2015-05-01 DIAGNOSIS — I10 Essential (primary) hypertension: Secondary | ICD-10-CM | POA: Diagnosis not present

## 2015-05-01 LAB — CBC
HCT: 37 % (ref 36.0–46.0)
Hemoglobin: 11.8 g/dL — ABNORMAL LOW (ref 12.0–15.0)
MCHC: 31.8 g/dL (ref 30.0–36.0)
MCV: 74.7 fl — ABNORMAL LOW (ref 78.0–100.0)
Platelets: 311 10*3/uL (ref 150.0–400.0)
RBC: 4.96 Mil/uL (ref 3.87–5.11)
RDW: 14.3 % (ref 11.5–15.5)
WBC: 8.8 10*3/uL (ref 4.0–10.5)

## 2015-05-01 LAB — TSH: TSH: 3.16 u[IU]/mL (ref 0.35–4.50)

## 2015-05-01 LAB — LIPID PANEL
Cholesterol: 191 mg/dL (ref 0–200)
HDL: 49.7 mg/dL (ref >39.00)
LDL Cholesterol: 119 mg/dL — ABNORMAL HIGH (ref 0–99)
NonHDL: 141.79
Total CHOL/HDL Ratio: 4
Triglycerides: 113 mg/dL (ref 0.0–149.0)
VLDL: 22.6 mg/dL (ref 0.0–40.0)

## 2015-05-01 LAB — COMPREHENSIVE METABOLIC PANEL
ALT: 13 U/L (ref 0–35)
AST: 14 U/L (ref 0–37)
Albumin: 4.4 g/dL (ref 3.5–5.2)
Alkaline Phosphatase: 56 U/L (ref 39–117)
BUN: 11 mg/dL (ref 6–23)
CO2: 32 mEq/L (ref 19–32)
Calcium: 10.2 mg/dL (ref 8.4–10.5)
Chloride: 105 mEq/L (ref 96–112)
Creatinine, Ser: 0.8 mg/dL (ref 0.40–1.20)
GFR: 91.83 mL/min (ref >60.00)
Glucose, Bld: 114 mg/dL — ABNORMAL HIGH (ref 70–99)
Potassium: 4.3 mEq/L (ref 3.5–5.1)
Sodium: 145 mEq/L (ref 135–145)
Total Bilirubin: 0.3 mg/dL (ref 0.2–1.2)
Total Protein: 7.6 g/dL (ref 6.0–8.3)

## 2015-05-01 LAB — HEMOGLOBIN A1C: Hgb A1c MFr Bld: 6.9 % — ABNORMAL HIGH (ref 4.6–6.5)

## 2015-05-02 ENCOUNTER — Encounter: Payer: Self-pay | Admitting: Family Medicine

## 2015-05-02 ENCOUNTER — Ambulatory Visit (INDEPENDENT_AMBULATORY_CARE_PROVIDER_SITE_OTHER): Payer: Medicare PPO | Admitting: Family Medicine

## 2015-05-02 ENCOUNTER — Ambulatory Visit (HOSPITAL_BASED_OUTPATIENT_CLINIC_OR_DEPARTMENT_OTHER)
Admission: RE | Admit: 2015-05-02 | Discharge: 2015-05-02 | Disposition: A | Discharge status: Home / Self Care | Payer: Medicare PPO | Source: Ambulatory Visit | Attending: Family Medicine | Admitting: Family Medicine

## 2015-05-02 VITALS — BP 128/84 | HR 78 | Temp 98.4°F | Ht 68.0 in | Wt 199.0 lb

## 2015-05-02 DIAGNOSIS — I10 Essential (primary) hypertension: Secondary | ICD-10-CM

## 2015-05-02 DIAGNOSIS — J209 Acute bronchitis, unspecified: Secondary | ICD-10-CM

## 2015-05-02 DIAGNOSIS — R079 Chest pain, unspecified: Secondary | ICD-10-CM | POA: Diagnosis not present

## 2015-05-02 DIAGNOSIS — R05 Cough: Secondary | ICD-10-CM | POA: Insufficient documentation

## 2015-05-02 DIAGNOSIS — E669 Obesity, unspecified: Secondary | ICD-10-CM

## 2015-05-02 DIAGNOSIS — E119 Type 2 diabetes mellitus without complications: Secondary | ICD-10-CM

## 2015-05-02 DIAGNOSIS — E1169 Type 2 diabetes mellitus with other specified complication: Secondary | ICD-10-CM

## 2015-05-02 DIAGNOSIS — R0989 Other specified symptoms and signs involving the circulatory and respiratory systems: Secondary | ICD-10-CM | POA: Insufficient documentation

## 2015-05-02 MED ORDER — AZITHROMYCIN 250 MG PO TABS: ORAL_TABLET | ORAL | Status: DC

## 2015-05-02 NOTE — Patient Instructions (Signed)
Encouraged increased rest and hydration, add probiotics such as Digestive Advantage or [facility], zinc such as Coldeze or Xicam. Treat fevers as needed. Mucinex twice daily x 10 days  Acute Bronchitis Bronchitis is inflammation of the airways that extend from the windpipe into the lungs (bronchi). The inflammation often causes mucus to develop. This leads to a cough, which is the most common symptom of bronchitis.  In acute bronchitis, the condition usually develops suddenly and goes away over time, usually in a couple weeks. Smoking, allergies, and asthma can make bronchitis worse. Repeated episodes of bronchitis may cause further lung problems.  CAUSES Acute bronchitis is most often caused by the same virus that causes a cold. The virus can spread from person to person (contagious) through coughing, sneezing, and touching contaminated objects. SIGNS AND SYMPTOMS   Cough.   Fever.   Coughing up mucus.   Body aches.   Chest congestion.   Chills.   Shortness of breath.   Sore throat.  DIAGNOSIS  Acute bronchitis is usually diagnosed through a physical exam. Your health care provider will also ask you questions about your medical history. Tests, such as chest X-rays, are sometimes done to rule out other conditions.  TREATMENT  Acute bronchitis usually goes away in a couple weeks. Oftentimes, no medical treatment is necessary. Medicines are sometimes given for relief of fever or cough. Antibiotic medicines are usually not needed but may be prescribed in certain situations. In some cases, an inhaler may be recommended to help reduce shortness of breath and control the cough. A cool mist vaporizer may also be used to help thin bronchial secretions and make it easier to clear the chest.  HOME CARE INSTRUCTIONS  Get plenty of rest.   Drink enough fluids to keep your urine clear or pale yellow (unless you have a medical condition that requires fluid restriction).  Increasing fluids may help thin your respiratory secretions (sputum) and reduce chest congestion, and it will prevent dehydration.   Take medicines only as directed by your health care provider.  If you were prescribed an antibiotic medicine, finish it all even if you start to feel better.  Avoid smoking and secondhand smoke. Exposure to cigarette smoke or irritating chemicals will make bronchitis worse. If you are a smoker, consider using nicotine gum or skin patches to help control withdrawal symptoms. Quitting smoking will help your lungs heal faster.   Reduce the chances of another bout of acute bronchitis by washing your hands frequently, avoiding people with cold symptoms, and trying not to touch your hands to your mouth, nose, or eyes.   Keep all follow-up visits as directed by your health care provider.  SEEK MEDICAL CARE IF: Your symptoms do not improve after 1 week of treatment.  SEEK IMMEDIATE MEDICAL CARE IF:  You develop an increased fever or chills.   You have chest pain.   You have severe shortness of breath.  You have bloody sputum.   You develop dehydration.  You faint or repeatedly feel like you are going to pass out.  You develop repeated vomiting.  You develop a severe headache. MAKE SURE YOU:   Understand these instructions.  Will watch your condition.  Will get help right away if you are not doing well or get worse.   This information is not intended to replace advice given to you by your health care provider. Make sure you discuss any questions you have with your health care provider.   Document Released: 07/10/2004  Document Revised: 06/23/2014 Document Reviewed: 11/23/2012 Elsevier Interactive Patient Education [employer].

## 2015-05-02 NOTE — Progress Notes (Signed)
Pre visit review using our clinic review tool, if applicable. No additional management support is needed unless otherwise documented below in the visit note. 

## 2015-05-07 ENCOUNTER — Telehealth: Payer: Self-pay | Admitting: Family Medicine

## 2015-05-07 NOTE — Telephone Encounter (Signed)
Ok to send in rx for Mupirocin ointment to apply to lesion bid return if no improvement, disp 1 tube

## 2015-05-07 NOTE — Telephone Encounter (Signed)
Caller name: Self   Can be reached: [phone] or [phone]  Pharmacy:  [id number] - [place], [place] [phone] (Phone) [phone] (Fax)         Reason for call: Patient still having discomfort in left breast. Request prescription for cream that was discussed

## 2015-05-08 MED ORDER — MUPIROCIN 2 % EX OINT: 1.0000 "application " | TOPICAL_OINTMENT | Freq: Two times a day (BID) | CUTANEOUS | Status: DC

## 2015-05-08 NOTE — Telephone Encounter (Signed)
Sent in prescription and patient contacted/informed of PCP information.

## 2015-05-11 DIAGNOSIS — J209 Acute bronchitis, unspecified: Secondary | ICD-10-CM | POA: Insufficient documentation

## 2015-05-11 DIAGNOSIS — R0789 Other chest pain: Secondary | ICD-10-CM | POA: Insufficient documentation

## 2015-05-11 NOTE — Assessment & Plan Note (Signed)
Encouraged increased rest and hydration, add probiotics, zinc such as Coldeze or Xicam. Treat fevers as needed. Started anti biotic

## 2015-05-11 NOTE — Assessment & Plan Note (Signed)
Well controlled, no changes to meds. Encouraged heart healthy diet such as the DASH diet and exercise as tolerated.  °

## 2015-05-11 NOTE — Progress Notes (Signed)
Subjective:    Patient ID: Morgan Medina, female    DOB: 15-Mar-1946, 69 y.o.   MRN: [id number]  Chief Complaint  Patient presents with  . Wheezing    x 2-3 days. pt has had some left side breast soreness, pt denies chest pain.     HPI Patient is in today for evaluation of respiratory symptoms. She is noting congestion and worsening wheezing. She acknowledges a cough which is generally dry. She denies fevers and chills. She denies headache. No chest pain, palpitation. Denies CP/palp/SOB/HA/congestion/fevers/GI or GU c/o. Taking meds as prescribed  Past Medical History  Diagnosis Date  . Hypertension   . Hyperlipidemia   . Anemia   . Overweight   . Cerebral aneurysm     h/o in 2006  . Diabetes mellitus type 2 in obese ([place]) 11/05/2014    Past Surgical History  Procedure Laterality Date  . Abdominal hysterectomy    . Cerebral aneurysm repair      Family History  Problem Relation Age of Onset  . Cerebral aneurysm Father   . Alzheimer's disease Mother   . Cancer Maternal Aunt     breast  . Diabetes Maternal Grandmother   . Stroke Maternal Grandfather   . Alzheimer's disease Paternal Grandmother   . Stroke Paternal Grandfather     Social History   Social History  . Marital Status: Widowed    Spouse Name: N/A  . Number of Children: N/A  . Years of Education: N/A   Occupational History  . Not on file.   Social History Main Topics  . Smoking status: Never Smoker   . Smokeless tobacco: Never Used  . Alcohol Use: No  . Drug Use: No  . Sexual Activity: No     Comment: lives with self, no dietary restrictions. works as a Theme park manager, retired from state employment   Other Topics Concern  . Not on file   Social History Narrative    Outpatient Prescriptions Prior to Visit  Medication Sig Dispense Refill  . amLODipine (NORVASC) 5 MG tablet Take 1 tablet (5 mg total) by mouth daily. 90 tablet 3  . aspirin 81 MG tablet Takes every other day    . atenolol (TENORMIN) 50  MG tablet Take 1 tablet (50 mg total) by mouth daily. 90 tablet 3  . Blood Glucose Monitoring Suppl ([place]) W/DEVICE KIT Use to check blood sugar. DX. E11.9 1 each 0  . Cyanocobalamin (VITAMIN B 12) 250 MCG LOZG Take by mouth.    . ferrous sulfate 325 (65 FE) MG tablet Take 325 mg by mouth daily with breakfast.    . furosemide (LASIX) 20 MG tablet Take two tablets every morning 180 tablet 2  . glucose blood (ONE TOUCH ULTRA TEST) test strip Use as idirected once daily.  DX E11.9 100 each 6  . glucose blood test strip Test once daily to check blood sugar.  DX E11.9 100 each 11  . losartan (COZAAR) 100 MG tablet Take one tablet every am . 90 tablet 1  . Multiple Vitamin (MULTIVITAMIN) capsule Take 1 capsule by mouth daily.    . ONE TOUCH LANCETS MISC Use as directed once daily to check blood sugar. DX: E11.9 100 each 0  . potassium chloride SA (K-DUR,KLOR-CON) 20 MEQ tablet Take 1 tablet (20 mEq total) by mouth daily. 90 tablet 2  . simvastatin (ZOCOR) 20 MG tablet Take 1 tablet (20 mg total) by mouth daily. 90 tablet 1   No  facility-administered medications prior to visit.    No Known Allergies  Review of Systems  Constitutional: Negative for fever, chills and malaise/fatigue.  HENT: Positive for congestion. Negative for hearing loss.   Eyes: Negative for discharge.  Respiratory: Positive for cough. Negative for sputum production and shortness of breath.   Cardiovascular: Negative for chest pain, palpitations and leg swelling.  Gastrointestinal: Negative for heartburn, nausea, vomiting, abdominal pain, diarrhea, constipation and blood in stool.  Genitourinary: Negative for dysuria, urgency, frequency and hematuria.  Musculoskeletal: Negative for myalgias, back pain and falls.  Skin: Negative for rash.  Neurological: Negative for dizziness, sensory change, loss of consciousness, weakness and headaches.  Endo/Heme/Allergies: Negative for environmental allergies. Does not  bruise/bleed easily.  Psychiatric/Behavioral: Negative for depression and suicidal ideas. The patient is not nervous/anxious and does not have insomnia.        Objective:    Physical Exam  Constitutional: She is oriented to person, place, and time. She appears well-developed and well-nourished. No distress.  HENT:  Head: Normocephalic and atraumatic.  Nose: Nose normal.  Eyes: Right eye exhibits no discharge. Left eye exhibits no discharge.  Neck: Normal range of motion. Neck supple.  Cardiovascular: Normal rate and regular rhythm.   No murmur heard. Pulmonary/Chest: Effort normal. She has wheezes.  Abdominal: Soft. Bowel sounds are normal. There is no tenderness.  Musculoskeletal: She exhibits no edema.  Neurological: She is alert and oriented to person, place, and time.  Skin: Skin is warm and dry.  Psychiatric: She has a normal mood and affect.  Nursing note and vitals reviewed.   BP 128/84 mmHg  Pulse 78  Temp(Src) 98.4 F (36.9 C) (Oral)  Ht '5\' 8"'  (1.727 m)  Wt 199 lb (90.266 kg)  BMI 30.26 kg/m2  SpO2 98% Wt Readings from Last 3 Encounters:  05/02/15 199 lb (90.266 kg)  01/25/15 200 lb (90.719 kg)  10/26/14 198 lb 6 oz (89.982 kg)     Lab Results  Component Value Date   WBC 8.8 05/01/2015   HGB 11.8* 05/01/2015   HCT 37.0 05/01/2015   PLT 311.0 05/01/2015   GLUCOSE 114* 05/01/2015   CHOL 191 05/01/2015   TRIG 113.0 05/01/2015   HDL 49.70 05/01/2015   LDLCALC 119* 05/01/2015   ALT 13 05/01/2015   AST 14 05/01/2015   NA 145 05/01/2015   K 4.3 05/01/2015   CL 105 05/01/2015   CREATININE 0.80 05/01/2015   BUN 11 05/01/2015   CO2 32 05/01/2015   TSH 3.16 05/01/2015   HGBA1C 6.9* 05/01/2015    Lab Results  Component Value Date   TSH 3.16 05/01/2015   Lab Results  Component Value Date   WBC 8.8 05/01/2015   HGB 11.8* 05/01/2015   HCT 37.0 05/01/2015   MCV 74.7* 05/01/2015   PLT 311.0 05/01/2015   Lab Results  Component Value Date   NA 145  05/01/2015   K 4.3 05/01/2015   CO2 32 05/01/2015   GLUCOSE 114* 05/01/2015   BUN 11 05/01/2015   CREATININE 0.80 05/01/2015   BILITOT 0.3 05/01/2015   ALKPHOS 56 05/01/2015   AST 14 05/01/2015   ALT 13 05/01/2015   PROT 7.6 05/01/2015   ALBUMIN 4.4 05/01/2015   CALCIUM 10.2 05/01/2015   ANIONGAP 8 07/04/2014   GFR 91.83 05/01/2015   Lab Results  Component Value Date   CHOL 191 05/01/2015   Lab Results  Component Value Date   HDL 49.70 05/01/2015   Lab Results  Component  Value Date   LDLCALC 119* 05/01/2015   Lab Results  Component Value Date   TRIG 113.0 05/01/2015   Lab Results  Component Value Date   CHOLHDL 4 05/01/2015   Lab Results  Component Value Date   HGBA1C 6.9* 05/01/2015       Assessment & Plan:   Problem List Items Addressed This Visit    Acute bronchitis - Primary    Encouraged increased rest and hydration, add probiotics, zinc such as Coldeze or Xicam. Treat fevers as needed. Started anti biotic      Relevant Medications   azithromycin (ZITHROMAX) 250 MG tablet   Other Relevant Orders   EKG 12-Lead (Completed)   DG Chest 2 View (Completed)   Diabetes mellitus type 2 in obese (HCC)    hgba1c acceptable, minimize simple carbs. Increase exercise as tolerated. Continue current meds      HTN (hypertension)    Well controlled, no changes to meds. Encouraged heart healthy diet such as the DASH diet and exercise as tolerated.       Pain in the chest   Relevant Medications   azithromycin (ZITHROMAX) 250 MG tablet   Other Relevant Orders   EKG 12-Lead (Completed)   DG Chest 2 View (Completed)      I am having Morgan Medina start on azithromycin. I am also having her maintain her multivitamin, ferrous sulfate, Vitamin B 12, potassium chloride SA, aspirin, atenolol, amLODipine, ONE TOUCH LANCETS, ONE TOUCH BASIC SYSTEM, glucose blood, glucose blood, simvastatin, losartan, and furosemide.  Meds ordered this encounter  Medications  .  azithromycin (ZITHROMAX) 250 MG tablet    Sig: 2 tabs po once and then 1 tab po daily x 4 days    Dispense:  6 tablet    Refill:  0     Penni Homans, MD

## 2015-05-11 NOTE — Assessment & Plan Note (Signed)
hgba1c acceptable, minimize simple carbs. Increase exercise as tolerated. Continue current meds 

## 2015-05-15 ENCOUNTER — Ambulatory Visit: Payer: Self-pay | Admitting: Family Medicine

## 2015-05-23 ENCOUNTER — Ambulatory Visit (INDEPENDENT_AMBULATORY_CARE_PROVIDER_SITE_OTHER): Payer: Medicare PPO

## 2015-05-23 DIAGNOSIS — Z23 Encounter for immunization: Secondary | ICD-10-CM

## 2015-07-19 ENCOUNTER — Ambulatory Visit (INDEPENDENT_AMBULATORY_CARE_PROVIDER_SITE_OTHER): Payer: Medicare Other | Admitting: Family Medicine

## 2015-07-19 ENCOUNTER — Ambulatory Visit: Payer: Self-pay | Admitting: Family Medicine

## 2015-07-19 ENCOUNTER — Encounter: Payer: Self-pay | Admitting: Family Medicine

## 2015-07-19 VITALS — BP 132/80 | HR 55 | Temp 98.2°F | Ht 68.0 in | Wt 201.5 lb

## 2015-07-19 DIAGNOSIS — H44001 Unspecified purulent endophthalmitis, right eye: Secondary | ICD-10-CM

## 2015-07-19 DIAGNOSIS — E785 Hyperlipidemia, unspecified: Secondary | ICD-10-CM | POA: Diagnosis not present

## 2015-07-19 DIAGNOSIS — I10 Essential (primary) hypertension: Secondary | ICD-10-CM | POA: Diagnosis not present

## 2015-07-19 DIAGNOSIS — E119 Type 2 diabetes mellitus without complications: Secondary | ICD-10-CM

## 2015-07-19 DIAGNOSIS — H04301 Unspecified dacryocystitis of right lacrimal passage: Secondary | ICD-10-CM

## 2015-07-19 DIAGNOSIS — H04309 Unspecified dacryocystitis of unspecified lacrimal passage: Secondary | ICD-10-CM

## 2015-07-19 DIAGNOSIS — E1169 Type 2 diabetes mellitus with other specified complication: Secondary | ICD-10-CM

## 2015-07-19 DIAGNOSIS — R079 Chest pain, unspecified: Secondary | ICD-10-CM

## 2015-07-19 DIAGNOSIS — E669 Obesity, unspecified: Secondary | ICD-10-CM

## 2015-07-19 HISTORY — DX: Unspecified dacryocystitis of unspecified lacrimal passage: H04.309

## 2015-07-19 MED ORDER — NEOMYCIN-POLYMYXIN-HC 3.5-10000-1 OP SUSP: 4.0000 [drp] | Freq: Three times a day (TID) | OPHTHALMIC | Status: DC

## 2015-07-19 NOTE — Assessment & Plan Note (Signed)
Started on Cortisporin opthamologic and set up with follow up with opthamology

## 2015-07-19 NOTE — Progress Notes (Signed)
Pre visit review using our clinic review tool, if applicable. No additional management support is needed unless otherwise documented below in the visit note. 

## 2015-07-19 NOTE — Patient Instructions (Signed)
Dacryocystitis  Dacryocystitis is an infection of the tear sac (lacrimal sac). The lacrimal sac lies between the inner corner of the eyelids and the nose. The glands of the eyelids produce tears. This is to keep the surface of the eye wet and protect it. These tears drain from the surface of the eyes through a duct in each lid (lacrimal ducts), then through the lacrimal sac into the nose. The tears are then swallowed.  If the lacrimal sacs become blocked, bacteria begin to buildup. The lacrimal sacs can become infected. Dacryocystitis may be sudden (acute) or long-lasting (chronic). This problem is most common in infants because the tear ducts are not fully developed and clog easily. In that case, infants may have episodes of tearing and infection. However, in most cases, the problem gets better as the infant grows.  CAUSES   The cause is often unknown. Known causes can include:   Malformation of the lacrimal sac.   Injury to the eye.   Eye infection.   Injury or inflammation of the nasal passages.  SYMPTOMS    Usually only one eye is involved.   Excessive tearing and watering from the involved eye.   Tenderness, redness, and swelling of the lower lid near the nose.   A sore, red, inflamed bump on the inner corner of the lower lid.  DIAGNOSIS   A diagnosis is made after an eye exam to see how much blockage is present and if the surface of the eye is also infected. A culture of the fluid from the lacrimal sac may be examined to find if a specific infection is present.  TREATMENT   Treatment depends on:    The person's age.   Whether or not the infection is chronic or acute.   The amount of blockage that is present.  Additional treatment  Sometimes massaging the area (starting from the inside of the eye and gently massaging down toward the nose) will improve the condition, combined with antibiotic eyedrops or ointments. If massaging the area does not work, it may be necessary to probe the ducts and open up  the drainage system. While this is easily done in the office in adults, probing usually has to be done under general anesthesia in infants.   If the blockage cannot be cleared by probing, surgery may be needed under general anesthesia to create a direct opening for tears to flow between the lacrimal sac and the inside of the nose (dacryocystorhinostomy, DCR).  HOME CARE INSTRUCTIONS    Use any antibiotic eyedrops, ointment, or pills as directed by the caregiver. Finish all medicines even if the symptoms start to get better.   Massage the lacrimal sac as directed by the caregiver.  SEEK IMMEDIATE MEDICAL CARE IF:    There is increased pain, swelling, redness, or drainage from the eye.   Muscle aches, chills, or a general sick feeling develop.   A fever or persistent symptoms develop for more than 2-3 days.   The fever and symptoms suddenly get worse.  MAKE SURE YOU:    Understand these instructions.   Will watch your condition.   Will get help right away if you are not doing well or get worse.     This information is not intended to replace advice given to you by your health care provider. Make sure you discuss any questions you have with your health care provider.     Document Released: 05/30/2000 Document Revised: 06/23/2014 Document Reviewed: 11/03/2011  Elsevier Interactive 

## 2015-07-22 ENCOUNTER — Encounter: Payer: Self-pay | Admitting: Family Medicine

## 2015-07-22 NOTE — Assessment & Plan Note (Signed)
hgba1c acceptable, minimize simple carbs. Increase exercise as tolerated. Continue current meds 

## 2015-07-22 NOTE — Assessment & Plan Note (Signed)
Well controlled, no changes to meds. Encouraged heart healthy diet such as the DASH diet and exercise as tolerated.  °

## 2015-07-22 NOTE — Assessment & Plan Note (Signed)
Left breast pain largely resolved. Occasional fleeting pains. Patient will report if worsens or changes

## 2015-07-22 NOTE — Progress Notes (Signed)
Patient ID: Morgan Medina, female   DOB: Aug 17, 1945, 70 y.o.   MRN: [id number]   Subjective:    Patient ID: Morgan Medina, female    DOB: 1945-08-26, 70 y.o.   MRN: [id number]  Chief Complaint  Patient presents with  . Eye Problem    drainage and redness  . Breast Problem    HPI Patient is in today for evaluation of several concerns. Notes several days of discharge, tenderness and slight redness over right eye. Had a history of a similar infection years ago that became very painful. No visual changes. Her left breast tenderness is improved. Has rare fleeting pains. No discharge or skin changes. No polyuria or polydipsia. Denies CP/palp/SOB/HA/congestion/fevers/GI or GU c/o. Taking meds as prescribed  Past Medical History  Diagnosis Date  . Hypertension   . Hyperlipidemia   . Anemia   . Overweight   . Cerebral aneurysm     h/o in 2006  . Diabetes mellitus type 2 in obese ([place]) 11/05/2014  . Dacrocystitis 07/19/2015    right    Past Surgical History  Procedure Laterality Date  . Abdominal hysterectomy    . Cerebral aneurysm repair      Family History  Problem Relation Age of Onset  . Cerebral aneurysm Father   . Alzheimer's disease Mother   . Cancer Maternal Aunt     breast  . Diabetes Maternal Grandmother   . Stroke Maternal Grandfather   . Alzheimer's disease Paternal Grandmother   . Stroke Paternal Grandfather     Social History   Social History  . Marital Status: Widowed    Spouse Name: N/A  . Number of Children: N/A  . Years of Education: N/A   Occupational History  . Not on file.   Social History Main Topics  . Smoking status: Never Smoker   . Smokeless tobacco: Never Used  . Alcohol Use: No  . Drug Use: No  . Sexual Activity: No     Comment: lives with self, no dietary restrictions. works as a Theme park manager, retired from state employment   Other Topics Concern  . Not on file   Social History Narrative    Outpatient Prescriptions Prior to Visit    Medication Sig Dispense Refill  . amLODipine (NORVASC) 5 MG tablet Take 1 tablet (5 mg total) by mouth daily. 90 tablet 3  . aspirin 81 MG tablet Takes every other day    . atenolol (TENORMIN) 50 MG tablet Take 1 tablet (50 mg total) by mouth daily. 90 tablet 3  . Blood Glucose Monitoring Suppl ([place]) W/DEVICE KIT Use to check blood sugar. DX. E11.9 1 each 0  . Cyanocobalamin (VITAMIN B 12) 250 MCG LOZG Take by mouth.    . ferrous sulfate 325 (65 FE) MG tablet Take 325 mg by mouth daily with breakfast.    . furosemide (LASIX) 20 MG tablet Take two tablets every morning 180 tablet 2  . glucose blood (ONE TOUCH ULTRA TEST) test strip Use as idirected once daily.  DX E11.9 100 each 6  . glucose blood test strip Test once daily to check blood sugar.  DX E11.9 100 each 11  . losartan (COZAAR) 100 MG tablet Take one tablet every am . 90 tablet 1  . Multiple Vitamin (MULTIVITAMIN) capsule Take 1 capsule by mouth daily.    . ONE TOUCH LANCETS MISC Use as directed once daily to check blood sugar. DX: E11.9 100 each 0  . potassium chloride SA (K-DUR,KLOR-CON)  20 MEQ tablet Take 1 tablet (20 mEq total) by mouth daily. 90 tablet 2  . simvastatin (ZOCOR) 20 MG tablet Take 1 tablet (20 mg total) by mouth daily. 90 tablet 1  . mupirocin ointment (BACTROBAN) 2 % Place 1 application into the nose 2 (two) times daily. (Patient not taking: Reported on 07/19/2015) 22 g 0  . azithromycin (ZITHROMAX) 250 MG tablet 2 tabs po once and then 1 tab po daily x 4 days 6 tablet 0   No facility-administered medications prior to visit.    No Known Allergies  Review of Systems  Constitutional: Negative for fever and malaise/fatigue.  HENT: Negative for congestion.   Eyes: Positive for discharge.  Respiratory: Negative for shortness of breath.   Cardiovascular: Positive for chest pain. Negative for palpitations and leg swelling.  Gastrointestinal: Negative for nausea and abdominal pain.  Genitourinary:  Negative for dysuria.  Musculoskeletal: Negative for falls.  Skin: Negative for rash.  Neurological: Negative for loss of consciousness and headaches.  Endo/Heme/Allergies: Negative for environmental allergies.  Psychiatric/Behavioral: Negative for depression. The patient is not nervous/anxious.        Objective:    Physical Exam  Constitutional: She is oriented to person, place, and time. She appears well-developed and well-nourished. No distress.  HENT:  Head: Normocephalic and atraumatic.  Nose: Nose normal.  Eyes: Right eye exhibits discharge. Left eye exhibits no discharge.  Slight redness around right eye.   Neck: Normal range of motion. Neck supple.  Cardiovascular: Normal rate and regular rhythm.   No murmur heard. Pulmonary/Chest: Effort normal and breath sounds normal.  Abdominal: Soft. Bowel sounds are normal. There is no tenderness.  Musculoskeletal: She exhibits no edema.  Neurological: She is alert and oriented to person, place, and time.  Skin: Skin is warm and dry.  Psychiatric: She has a normal mood and affect.  Nursing note and vitals reviewed.   BP 132/80 mmHg  Pulse 55  Temp(Src) 98.2 F (36.8 C) (Oral)  Ht '5\' 8"'  (1.727 m)  Wt 201 lb 8 oz (91.4 kg)  BMI 30.65 kg/m2  SpO2 98% Wt Readings from Last 3 Encounters:  07/19/15 201 lb 8 oz (91.4 kg)  05/02/15 199 lb (90.266 kg)  01/25/15 200 lb (90.719 kg)     Lab Results  Component Value Date   WBC 8.8 05/01/2015   HGB 11.8* 05/01/2015   HCT 37.0 05/01/2015   PLT 311.0 05/01/2015   GLUCOSE 114* 05/01/2015   CHOL 191 05/01/2015   TRIG 113.0 05/01/2015   HDL 49.70 05/01/2015   LDLCALC 119* 05/01/2015   ALT 13 05/01/2015   AST 14 05/01/2015   NA 145 05/01/2015   K 4.3 05/01/2015   CL 105 05/01/2015   CREATININE 0.80 05/01/2015   BUN 11 05/01/2015   CO2 32 05/01/2015   TSH 3.16 05/01/2015   HGBA1C 6.9* 05/01/2015    Lab Results  Component Value Date   TSH 3.16 05/01/2015   Lab Results    Component Value Date   WBC 8.8 05/01/2015   HGB 11.8* 05/01/2015   HCT 37.0 05/01/2015   MCV 74.7* 05/01/2015   PLT 311.0 05/01/2015   Lab Results  Component Value Date   NA 145 05/01/2015   K 4.3 05/01/2015   CO2 32 05/01/2015   GLUCOSE 114* 05/01/2015   BUN 11 05/01/2015   CREATININE 0.80 05/01/2015   BILITOT 0.3 05/01/2015   ALKPHOS 56 05/01/2015   AST 14 05/01/2015   ALT 13 05/01/2015  PROT 7.6 05/01/2015   ALBUMIN 4.4 05/01/2015   CALCIUM 10.2 05/01/2015   ANIONGAP 8 07/04/2014   GFR 91.83 05/01/2015   Lab Results  Component Value Date   CHOL 191 05/01/2015   Lab Results  Component Value Date   HDL 49.70 05/01/2015   Lab Results  Component Value Date   LDLCALC 119* 05/01/2015   Lab Results  Component Value Date   TRIG 113.0 05/01/2015   Lab Results  Component Value Date   CHOLHDL 4 05/01/2015   Lab Results  Component Value Date   HGBA1C 6.9* 05/01/2015       Assessment & Plan:   Problem List Items Addressed This Visit    Dacrocystitis    Started on Cortisporin opthamologic and set up with follow up with opthamology      Diabetes mellitus type 2 in obese ([facility])    hgba1c acceptable, minimize simple carbs. Increase exercise as tolerated. Continue current meds      Relevant Orders   TSH   CBC   Comprehensive metabolic panel   Hemoglobin A1c   Microalbumin / creatinine urine ratio   Lipid panel   HTN (hypertension)    Well controlled, no changes to meds. Encouraged heart healthy diet such as the DASH diet and exercise as tolerated.       Relevant Orders   TSH   CBC   Comprehensive metabolic panel   Hemoglobin A1c   Microalbumin / creatinine urine ratio   Lipid panel   Hyperlipidemia   Relevant Orders   TSH   CBC   Comprehensive metabolic panel   Hemoglobin A1c   Microalbumin / creatinine urine ratio   Lipid panel   Pain in the chest    Left breast pain largely resolved. Occasional fleeting pains. Patient will report if  worsens or changes       Other Visit Diagnoses    Eye infection, right    -  Primary    Relevant Orders    Ambulatory referral to Ophthalmology    TSH    CBC    Comprehensive metabolic panel    Hemoglobin A1c    Microalbumin / creatinine urine ratio    Lipid panel       I have discontinued Ms. Arcos's azithromycin. I am also having her start on neomycin-polymyxin-hydrocortisone. Additionally, I am having her maintain her multivitamin, ferrous sulfate, Vitamin B 12, potassium chloride SA, aspirin, atenolol, amLODipine, ONE TOUCH LANCETS, ONE TOUCH BASIC SYSTEM, glucose blood, glucose blood, simvastatin, losartan, furosemide, and mupirocin ointment.  Meds ordered this encounter  Medications  . neomycin-polymyxin-hydrocortisone (CORTISPORIN) 3.5-10000-1 ophthalmic suspension    Sig: Place 4 drops into the right eye 3 (three) times daily. X 7-10 days    Dispense:  7.5 mL    Refill:  0     Penni Homans, MD

## 2015-08-03 ENCOUNTER — Ambulatory Visit: Payer: Self-pay | Admitting: Family Medicine

## 2015-08-23 ENCOUNTER — Telehealth: Payer: Self-pay | Admitting: Family Medicine

## 2015-08-23 MED ORDER — LOSARTAN POTASSIUM 100 MG PO TABS: ORAL_TABLET | ORAL | Status: DC

## 2015-08-23 MED ORDER — SIMVASTATIN 20 MG PO TABS: 20.0000 mg | ORAL_TABLET | Freq: Every day | ORAL | Status: DC

## 2015-08-23 NOTE — Telephone Encounter (Signed)
Refills done and patient informed

## 2015-08-23 NOTE — Telephone Encounter (Signed)
Caller name: Self  Can be reached: [phone]  Pharmacy:  [id number] - [place], [place] [phone] (Phone) [phone] (Fax)         Reason for call: Refill simvastatin (ZOCOR) 20 MG tablet [id number] and losartan (COZAAR) 100 MG tablet [id number]

## 2015-08-24 LAB — HM DIABETES EYE EXAM

## 2015-09-27 ENCOUNTER — Other Ambulatory Visit: Payer: Self-pay | Admitting: Family Medicine

## 2015-09-27 MED ORDER — POTASSIUM CHLORIDE CRYS ER 20 MEQ PO TBCR: 20.0000 meq | EXTENDED_RELEASE_TABLET | Freq: Every day | ORAL | Status: DC

## 2015-09-27 NOTE — Telephone Encounter (Signed)
Rx sent 

## 2015-09-27 NOTE — Telephone Encounter (Signed)
Caller name: Self  Can be reached: [phone]  Pharmacy:  [id number] - [place], [place] [phone] (Phone) [phone] (Fax)        Reason for call: Refill potassium chloride SA (K-DUR,KLOR-CON) 20 MEQ tablet [id number]

## 2015-11-17 ENCOUNTER — Other Ambulatory Visit: Payer: Self-pay | Admitting: Family Medicine

## 2015-11-22 ENCOUNTER — Ambulatory Visit (HOSPITAL_BASED_OUTPATIENT_CLINIC_OR_DEPARTMENT_OTHER)
Admission: RE | Admit: 2015-11-22 | Discharge: 2015-11-22 | Disposition: A | Discharge status: Home / Self Care | Payer: Medicare Other | Source: Ambulatory Visit | Attending: Family Medicine | Admitting: Family Medicine

## 2015-11-22 ENCOUNTER — Encounter: Payer: Self-pay | Admitting: Family Medicine

## 2015-11-22 ENCOUNTER — Ambulatory Visit (INDEPENDENT_AMBULATORY_CARE_PROVIDER_SITE_OTHER): Payer: Medicare Other | Admitting: Family Medicine

## 2015-11-22 ENCOUNTER — Ambulatory Visit (HOSPITAL_COMMUNITY): Payer: Self-pay

## 2015-11-22 VITALS — BP 144/86 | HR 55 | Temp 97.7°F | Ht 68.0 in | Wt 203.4 lb

## 2015-11-22 DIAGNOSIS — E669 Obesity, unspecified: Secondary | ICD-10-CM | POA: Diagnosis not present

## 2015-11-22 DIAGNOSIS — Z0000 Encounter for general adult medical examination without abnormal findings: Secondary | ICD-10-CM | POA: Insufficient documentation

## 2015-11-22 DIAGNOSIS — M85852 Other specified disorders of bone density and structure, left thigh: Secondary | ICD-10-CM | POA: Insufficient documentation

## 2015-11-22 DIAGNOSIS — D508 Other iron deficiency anemias: Secondary | ICD-10-CM | POA: Diagnosis not present

## 2015-11-22 DIAGNOSIS — E785 Hyperlipidemia, unspecified: Secondary | ICD-10-CM

## 2015-11-22 DIAGNOSIS — Z780 Asymptomatic menopausal state: Secondary | ICD-10-CM

## 2015-11-22 DIAGNOSIS — M25552 Pain in left hip: Secondary | ICD-10-CM | POA: Insufficient documentation

## 2015-11-22 DIAGNOSIS — I10 Essential (primary) hypertension: Secondary | ICD-10-CM

## 2015-11-22 DIAGNOSIS — E119 Type 2 diabetes mellitus without complications: Secondary | ICD-10-CM | POA: Insufficient documentation

## 2015-11-22 DIAGNOSIS — M8580 Other specified disorders of bone density and structure, unspecified site: Secondary | ICD-10-CM

## 2015-11-22 DIAGNOSIS — E1169 Type 2 diabetes mellitus with other specified complication: Secondary | ICD-10-CM

## 2015-11-22 DIAGNOSIS — E663 Overweight: Secondary | ICD-10-CM

## 2015-11-22 DIAGNOSIS — M1612 Unilateral primary osteoarthritis, left hip: Secondary | ICD-10-CM

## 2015-11-22 HISTORY — DX: Other specified disorders of bone density and structure, unspecified site: M85.80

## 2015-11-22 LAB — COMPREHENSIVE METABOLIC PANEL
ALT: 11 U/L (ref 0–35)
AST: 11 U/L (ref 0–37)
Albumin: 4.5 g/dL (ref 3.5–5.2)
Alkaline Phosphatase: 59 U/L (ref 39–117)
BUN: 11 mg/dL (ref 6–23)
CO2: 35 mEq/L — ABNORMAL HIGH (ref 19–32)
Calcium: 10.5 mg/dL (ref 8.4–10.5)
Chloride: 101 mEq/L (ref 96–112)
Creatinine, Ser: 0.77 mg/dL (ref 0.40–1.20)
GFR: 95.81 mL/min (ref >60.00)
Glucose, Bld: 144 mg/dL — ABNORMAL HIGH (ref 70–99)
Potassium: 3.7 mEq/L (ref 3.5–5.1)
Sodium: 143 mEq/L (ref 135–145)
Total Bilirubin: 0.4 mg/dL (ref 0.2–1.2)
Total Protein: 7.6 g/dL (ref 6.0–8.3)

## 2015-11-22 LAB — CBC
HCT: 37.6 % (ref 36.0–46.0)
Hemoglobin: 11.8 g/dL — ABNORMAL LOW (ref 12.0–15.0)
MCHC: 31.4 g/dL (ref 30.0–36.0)
MCV: 74.6 fl — ABNORMAL LOW (ref 78.0–100.0)
Platelets: 307 10*3/uL (ref 150.0–400.0)
RBC: 5.04 Mil/uL (ref 3.87–5.11)
RDW: 13.4 % (ref 11.5–15.5)
WBC: 10.3 10*3/uL (ref 4.0–10.5)

## 2015-11-22 LAB — LIPID PANEL
Cholesterol: 186 mg/dL (ref 0–200)
HDL: 49.8 mg/dL (ref >39.00)
LDL Cholesterol: 112 mg/dL — ABNORMAL HIGH (ref 0–99)
NonHDL: 136.43
Total CHOL/HDL Ratio: 4
Triglycerides: 121 mg/dL (ref 0.0–149.0)
VLDL: 24.2 mg/dL (ref 0.0–40.0)

## 2015-11-22 LAB — HEMOGLOBIN A1C: Hgb A1c MFr Bld: 7 % — ABNORMAL HIGH (ref 4.6–6.5)

## 2015-11-22 LAB — TSH: TSH: 2.47 u[IU]/mL (ref 0.35–4.50)

## 2015-11-22 MED ORDER — POTASSIUM CHLORIDE CRYS ER 20 MEQ PO TBCR: 20.0000 meq | EXTENDED_RELEASE_TABLET | Freq: Every day | ORAL | Status: DC

## 2015-11-22 MED ORDER — FUROSEMIDE 20 MG PO TABS: ORAL_TABLET | ORAL | Status: DC

## 2015-11-22 NOTE — Progress Notes (Signed)
Pre visit review using our clinic review tool, if applicable. No additional management support is needed unless otherwise documented below in the visit note. 

## 2015-11-22 NOTE — Patient Instructions (Addendum)
Salon Pas patch or Aspercreme patch for pain   Hip Pain Your hip is the joint between your upper legs and your lower pelvis. The bones, cartilage, tendons, and muscles of your hip joint perform a lot of work each day supporting your body weight and allowing you to move around. Hip pain can range from a minor ache to severe pain in one or both of your hips. Pain may be felt on the inside of the hip joint near the groin, or the outside near the buttocks and upper thigh. You may have swelling or stiffness as well.  HOME CARE INSTRUCTIONS   Take medicines only as directed by your health care provider.  Apply ice to the injured area:  Put ice in a plastic bag.  Place a towel between your skin and the bag.  Leave the ice on for 15-20 minutes at a time, 3-4 times a day.  Keep your leg raised (elevated) when possible to lessen swelling.  Avoid activities that cause pain.  Follow specific exercises as directed by your health care provider.  Sleep with a pillow between your legs on your most comfortable side.  Record how often you have hip pain, the location of the pain, and what it feels like. SEEK MEDICAL CARE IF:   You are unable to put weight on your leg.  Your hip is red or swollen or very tender to touch.  Your pain or swelling continues or worsens after 1 week.  You have increasing difficulty walking.  You have a fever. SEEK IMMEDIATE MEDICAL CARE IF:   You have fallen.  You have a sudden increase in pain and swelling in your hip. MAKE SURE YOU:   Understand these instructions.  Will watch your condition.  Will get help right away if you are not doing well or get worse.   This information is not intended to replace advice given to you by your health care provider. Make sure you discuss any questions you have with your health care provider.   Document Released: 11/20/2009 Document Revised: 06/23/2014 Document Reviewed: 01/27/2013 Elsevier Interactive Patient  Education [employer].

## 2015-12-02 ENCOUNTER — Encounter: Payer: Self-pay | Admitting: Family Medicine

## 2015-12-02 DIAGNOSIS — M1612 Unilateral primary osteoarthritis, left hip: Secondary | ICD-10-CM

## 2015-12-02 HISTORY — DX: Unilateral primary osteoarthritis, left hip: M16.12

## 2015-12-02 NOTE — Assessment & Plan Note (Signed)
Xray confirms early arthritis. Encouraged moist heat and gentle stretching as tolerated. May try NSAIDs and prescription meds as directed and report if symptoms worsen or seek immediate care. Stay active and try topical treatments.

## 2015-12-02 NOTE — Assessment & Plan Note (Signed)
hgba1c acceptable, minimize simple carbs. Increase exercise as tolerated. Continue current meds 

## 2015-12-02 NOTE — Assessment & Plan Note (Signed)
Mildly elevated. no changes to meds. Encouraged heart healthy diet such as the DASH diet and exercise as tolerated.  

## 2015-12-02 NOTE — Assessment & Plan Note (Signed)
Encouraged DASH diet, decrease po intake and increase exercise as tolerated. Needs 7-8 hours of sleep nightly. Avoid trans fats, eat small, frequent meals every 4-5 hours with lean proteins, complex carbs and healthy fats. Minimize simple carbs 

## 2015-12-02 NOTE — Progress Notes (Signed)
Patient ID: Morgan Medina, female   DOB: [birth year], 70 y.o.   MRN: [id number]   Subjective:    Patient ID: Morgan Medina, female    DOB: 10-Jun-1946, 70 y.o.   MRN: [id number]  Chief Complaint  Patient presents with  . Follow-up    HPI Patient is in today for follow up. No recent illness but is noting left hip pain, hurts while lying on left hip and feels better when lying on riht hip. No fall or trauma. Notes some pain in right ring finger as well. Denies CP/palp/SOB/HA/congestion/fevers/GI or GU c/o. Taking meds as prescribed  Past Medical History  Diagnosis Date  . Hypertension   . Hyperlipidemia   . Anemia   . Overweight   . Cerebral aneurysm     h/o in 2006  . Diabetes mellitus type 2 in obese ([address]) 11/05/2014  . Dacrocystitis 07/19/2015    right  . Preventative health care 11/22/2015  . Osteopenia 11/22/2015  . Osteoarthritis of left hip 12/02/2015    Past Surgical History  Procedure Laterality Date  . Abdominal hysterectomy    . Cerebral aneurysm repair      Family History  Problem Relation Age of Onset  . Cerebral aneurysm Father   . Alzheimer's disease Mother   . Cancer Maternal Aunt     breast  . Diabetes Maternal Grandmother   . Stroke Maternal Grandfather   . Alzheimer's disease Paternal Grandmother   . Stroke Paternal Grandfather     Social History   Social History  . Marital Status: Widowed    Spouse Name: N/A  . Number of Children: N/A  . Years of Education: N/A   Occupational History  . Not on file.   Social History Main Topics  . Smoking status: Never Smoker   . Smokeless tobacco: Never Used  . Alcohol Use: No  . Drug Use: No  . Sexual Activity: No     Comment: lives with self, no dietary restrictions. works as a Theme park manager, retired from state employment   Other Topics Concern  . Not on file   Social History Narrative    Outpatient Prescriptions Prior to Visit  Medication Sig Dispense Refill  . amLODipine (NORVASC) 5 MG tablet TAKE 1 TABLET  (5 MG TOTAL) BY MOUTH DAILY. 90 tablet 3  . aspirin 81 MG tablet Takes every other day    . atenolol (TENORMIN) 50 MG tablet TAKE 1 TABLET (50 MG TOTAL) BY MOUTH DAILY. 90 tablet 2  . Blood Glucose Monitoring Suppl ([place]) W/DEVICE KIT Use to check blood sugar. DX. E11.9 1 each 0  . Cyanocobalamin (VITAMIN B 12) 250 MCG LOZG Take by mouth.    . ferrous sulfate 325 (65 FE) MG tablet Take 325 mg by mouth daily with breakfast.    . glucose blood (ONE TOUCH ULTRA TEST) test strip Use as idirected once daily.  DX E11.9 100 each 6  . glucose blood test strip Test once daily to check blood sugar.  DX E11.9 100 each 11  . losartan (COZAAR) 100 MG tablet Take one tablet every am . 90 tablet 1  . Multiple Vitamin (MULTIVITAMIN) capsule Take 1 capsule by mouth daily.    . ONE TOUCH LANCETS MISC Use as directed once daily to check blood sugar. DX: E11.9 100 each 0  . simvastatin (ZOCOR) 20 MG tablet Take 1 tablet (20 mg total) by mouth daily. 90 tablet 1  . furosemide (LASIX) 20 MG tablet Take two tablets  every morning 180 tablet 2  . mupirocin ointment (BACTROBAN) 2 % Place 1 application into the nose 2 (two) times daily. 22 g 0  . neomycin-polymyxin-hydrocortisone (CORTISPORIN) 3.5-10000-1 ophthalmic suspension Place 4 drops into the right eye 3 (three) times daily. X 7-10 days 7.5 mL 0  . potassium chloride SA (K-DUR,KLOR-CON) 20 MEQ tablet Take 1 tablet (20 mEq total) by mouth daily. 90 tablet 0   No facility-administered medications prior to visit.    No Known Allergies  Review of Systems  Constitutional: Negative for fever and malaise/fatigue.  HENT: Negative for congestion.   Eyes: Negative for blurred vision.  Respiratory: Negative for shortness of breath.   Cardiovascular: Negative for chest pain, palpitations and leg swelling.  Gastrointestinal: Negative for nausea, abdominal pain and blood in stool.  Genitourinary: Negative for dysuria and frequency.  Musculoskeletal:  Positive for joint pain. Negative for falls.  Skin: Negative for rash.  Neurological: Negative for dizziness, loss of consciousness and headaches.  Endo/Heme/Allergies: Negative for environmental allergies.  Psychiatric/Behavioral: Negative for depression. The patient is not nervous/anxious.        Objective:    Physical Exam  Constitutional: She is oriented to person, place, and time. She appears well-developed and well-nourished. No distress.  HENT:  Head: Normocephalic and atraumatic.  Nose: Nose normal.  Eyes: Right eye exhibits no discharge. Left eye exhibits no discharge.  Neck: Normal range of motion. Neck supple.  Cardiovascular: Normal rate and regular rhythm.   No murmur heard. Pulmonary/Chest: Effort normal and breath sounds normal.  Abdominal: Soft. Bowel sounds are normal. There is no tenderness.  Musculoskeletal: She exhibits no edema.  Neurological: She is alert and oriented to person, place, and time.  Skin: Skin is warm and dry.  Psychiatric: She has a normal mood and affect.  Nursing note and vitals reviewed.   BP 144/86 mmHg  Pulse 55  Temp(Src) 97.7 F (36.5 C) (Oral)  Ht '5\' 8"'  (1.727 m)  Wt 203 lb 6 oz (92.25 kg)  BMI 30.93 kg/m2  SpO2 98% Wt Readings from Last 3 Encounters:  11/22/15 203 lb 6 oz (92.25 kg)  07/19/15 201 lb 8 oz (91.4 kg)  05/02/15 199 lb (90.266 kg)     Lab Results  Component Value Date   WBC 10.3 11/22/2015   HGB 11.8* 11/22/2015   HCT 37.6 11/22/2015   PLT 307.0 11/22/2015   GLUCOSE 144* 11/22/2015   CHOL 186 11/22/2015   TRIG 121.0 11/22/2015   HDL 49.80 11/22/2015   LDLCALC 112* 11/22/2015   ALT 11 11/22/2015   AST 11 11/22/2015   NA 143 11/22/2015   K 3.7 11/22/2015   CL 101 11/22/2015   CREATININE 0.77 11/22/2015   BUN 11 11/22/2015   CO2 35* 11/22/2015   TSH 2.47 11/22/2015   HGBA1C 7.0* 11/22/2015    Lab Results  Component Value Date   TSH 2.47 11/22/2015   Lab Results  Component Value Date   WBC  10.3 11/22/2015   HGB 11.8* 11/22/2015   HCT 37.6 11/22/2015   MCV 74.6* 11/22/2015   PLT 307.0 11/22/2015   Lab Results  Component Value Date   NA 143 11/22/2015   K 3.7 11/22/2015   CO2 35* 11/22/2015   GLUCOSE 144* 11/22/2015   BUN 11 11/22/2015   CREATININE 0.77 11/22/2015   BILITOT 0.4 11/22/2015   ALKPHOS 59 11/22/2015   AST 11 11/22/2015   ALT 11 11/22/2015   PROT 7.6 11/22/2015   ALBUMIN 4.5 11/22/2015  CALCIUM 10.5 11/22/2015   ANIONGAP 8 07/04/2014   GFR 95.81 11/22/2015   Lab Results  Component Value Date   CHOL 186 11/22/2015   Lab Results  Component Value Date   HDL 49.80 11/22/2015   Lab Results  Component Value Date   LDLCALC 112* 11/22/2015   Lab Results  Component Value Date   TRIG 121.0 11/22/2015   Lab Results  Component Value Date   CHOLHDL 4 11/22/2015   Lab Results  Component Value Date   HGBA1C 7.0* 11/22/2015       Assessment & Plan:   Problem List Items Addressed This Visit    Preventative health care   Relevant Orders   DG HIP UNILAT WITH PELVIS 2-3 VIEWS LEFT (Completed)   DG Bone Density (Completed)   CBC (Completed)   TSH (Completed)   Lipid panel (Completed)   Comprehensive metabolic panel (Completed)   Hemoglobin A1c (Completed)   Overweight    Encouraged DASH diet, decrease po intake and increase exercise as tolerated. Needs 7-8 hours of sleep nightly. Avoid trans fats, eat small, frequent meals every 4-5 hours with lean proteins, complex carbs and healthy fats. Minimize simple carbs      Osteopenia    Bone scan confirms osteopenia persists. Encouraged to get adequate exercise, calcium and vitamin d intake      Osteoarthritis of left hip    Xray confirms early arthritis. Encouraged moist heat and gentle stretching as tolerated. May try NSAIDs and prescription meds as directed and report if symptoms worsen or seek immediate care. Stay active and try topical treatments.       Menopause   Relevant Orders   DG  HIP UNILAT WITH PELVIS 2-3 VIEWS LEFT (Completed)   DG Bone Density (Completed)   CBC (Completed)   TSH (Completed)   Lipid panel (Completed)   Comprehensive metabolic panel (Completed)   Hemoglobin A1c (Completed)   Hyperlipidemia   Relevant Medications   furosemide (LASIX) 20 MG tablet   Other Relevant Orders   DG HIP UNILAT WITH PELVIS 2-3 VIEWS LEFT (Completed)   DG Bone Density (Completed)   CBC (Completed)   TSH (Completed)   Lipid panel (Completed)   Comprehensive metabolic panel (Completed)   Hemoglobin A1c (Completed)   HTN (hypertension)    Mildly elevated. no changes to meds. Encouraged heart healthy diet such as the DASH diet and exercise as tolerated.       Relevant Medications   furosemide (LASIX) 20 MG tablet   Other Relevant Orders   DG HIP UNILAT WITH PELVIS 2-3 VIEWS LEFT (Completed)   DG Bone Density (Completed)   CBC (Completed)   TSH (Completed)   Lipid panel (Completed)   Comprehensive metabolic panel (Completed)   Hemoglobin A1c (Completed)   Diabetes mellitus type 2 in obese ([facility])    hgba1c acceptable, minimize simple carbs. Increase exercise as tolerated. Continue current meds      Relevant Orders   DG HIP UNILAT WITH PELVIS 2-3 VIEWS LEFT (Completed)   DG Bone Density (Completed)   CBC (Completed)   TSH (Completed)   Lipid panel (Completed)   Comprehensive metabolic panel (Completed)   Hemoglobin A1c (Completed)   Anemia   Relevant Orders   DG HIP UNILAT WITH PELVIS 2-3 VIEWS LEFT (Completed)   DG Bone Density (Completed)   CBC (Completed)   TSH (Completed)   Lipid panel (Completed)   Comprehensive metabolic panel (Completed)   Hemoglobin A1c (Completed)    Other Visit Diagnoses  Hip pain, left    -  Primary    Relevant Orders    DG HIP UNILAT WITH PELVIS 2-3 VIEWS LEFT (Completed)    DG Bone Density (Completed)    CBC (Completed)    TSH (Completed)    Lipid panel (Completed)    Comprehensive metabolic panel (Completed)     Hemoglobin A1c (Completed)    Postmenopausal estrogen deficiency        Relevant Orders    DG HIP UNILAT WITH PELVIS 2-3 VIEWS LEFT (Completed)    DG Bone Density (Completed)    CBC (Completed)    TSH (Completed)    Lipid panel (Completed)    Comprehensive metabolic panel (Completed)    Hemoglobin A1c (Completed)       I have discontinued Ms. Mccullers's mupirocin ointment and neomycin-polymyxin-hydrocortisone. I am also having her maintain her multivitamin, ferrous sulfate, Vitamin B 12, aspirin, ONE TOUCH LANCETS, ONE TOUCH BASIC SYSTEM, glucose blood, glucose blood, simvastatin, losartan, amLODipine, atenolol, potassium chloride SA, and furosemide.  Meds ordered this encounter  Medications  . potassium chloride SA (K-DUR,KLOR-CON) 20 MEQ tablet    Sig: Take 1 tablet (20 mEq total) by mouth daily.    Dispense:  90 tablet    Refill:  1  . furosemide (LASIX) 20 MG tablet    Sig: Take two tablets every morning    Dispense:  180 tablet    Refill:  2     Penni Homans, MD

## 2015-12-02 NOTE — Assessment & Plan Note (Signed)
Bone scan confirms osteopenia persists. Encouraged to get adequate exercise, calcium and vitamin d intake

## 2016-03-31 ENCOUNTER — Other Ambulatory Visit: Payer: Self-pay | Admitting: Family Medicine

## 2016-04-17 ENCOUNTER — Ambulatory Visit (HOSPITAL_BASED_OUTPATIENT_CLINIC_OR_DEPARTMENT_OTHER)
Admission: RE | Admit: 2016-04-17 | Discharge: 2016-04-17 | Disposition: A | Discharge status: Home / Self Care | Payer: Medicare Other | Source: Ambulatory Visit | Attending: Family Medicine | Admitting: Family Medicine

## 2016-04-17 ENCOUNTER — Ambulatory Visit (INDEPENDENT_AMBULATORY_CARE_PROVIDER_SITE_OTHER): Payer: Medicare Other | Admitting: Family Medicine

## 2016-04-17 ENCOUNTER — Encounter: Payer: Self-pay | Admitting: Family Medicine

## 2016-04-17 VITALS — BP 130/82 | HR 62 | Temp 98.2°F | Ht 68.0 in | Wt 202.4 lb

## 2016-04-17 DIAGNOSIS — Z23 Encounter for immunization: Secondary | ICD-10-CM

## 2016-04-17 DIAGNOSIS — K219 Gastro-esophageal reflux disease without esophagitis: Secondary | ICD-10-CM

## 2016-04-17 DIAGNOSIS — K635 Polyp of colon: Secondary | ICD-10-CM

## 2016-04-17 DIAGNOSIS — I10 Essential (primary) hypertension: Secondary | ICD-10-CM

## 2016-04-17 DIAGNOSIS — Z1239 Encounter for other screening for malignant neoplasm of breast: Secondary | ICD-10-CM

## 2016-04-17 DIAGNOSIS — E782 Mixed hyperlipidemia: Secondary | ICD-10-CM

## 2016-04-17 DIAGNOSIS — Z1231 Encounter for screening mammogram for malignant neoplasm of breast: Secondary | ICD-10-CM | POA: Diagnosis present

## 2016-04-17 DIAGNOSIS — D508 Other iron deficiency anemias: Secondary | ICD-10-CM | POA: Diagnosis not present

## 2016-04-17 DIAGNOSIS — E669 Obesity, unspecified: Secondary | ICD-10-CM | POA: Diagnosis not present

## 2016-04-17 DIAGNOSIS — E1169 Type 2 diabetes mellitus with other specified complication: Secondary | ICD-10-CM | POA: Diagnosis not present

## 2016-04-17 DIAGNOSIS — Z0000 Encounter for general adult medical examination without abnormal findings: Secondary | ICD-10-CM | POA: Diagnosis not present

## 2016-04-17 DIAGNOSIS — E663 Overweight: Secondary | ICD-10-CM

## 2016-04-17 DIAGNOSIS — M8580 Other specified disorders of bone density and structure, unspecified site: Secondary | ICD-10-CM

## 2016-04-17 HISTORY — DX: Gastro-esophageal reflux disease without esophagitis: K21.9

## 2016-04-17 NOTE — Assessment & Plan Note (Signed)
Colonoscopy with Dr Truman Hayward at [facility], given 10 years and last colonoscopy a couple years ago and goo

## 2016-04-17 NOTE — Assessment & Plan Note (Signed)
Roughly 6 months of intermittent heartburn, has not tried any OTC meds. Add probiotic, Tums and Ranitidine as needed

## 2016-04-17 NOTE — Assessment & Plan Note (Signed)
Encouraged DASH diet, decrease po intake and increase exercise as tolerated. Needs 7-8 hours of sleep nightly. Avoid trans fats, eat small, frequent meals every 4-5 hours with lean proteins, complex carbs and healthy fats. Minimize simple carbs, 

## 2016-04-17 NOTE — Assessment & Plan Note (Signed)
Encouraged to get adequate exercise, calcium and vitamin d intake 

## 2016-04-17 NOTE — Assessment & Plan Note (Signed)
hgba1c acceptable, minimize simple carbs. Increase exercise as tolerated. Continue current meds 

## 2016-04-17 NOTE — Assessment & Plan Note (Signed)
Tolerating statin, encouraged heart healthy diet, avoid trans fats, minimize simple carbs and saturated fats. Increase exercise as tolerated 

## 2016-04-17 NOTE — Assessment & Plan Note (Signed)
Patient encouraged to maintain heart healthy diet, regular exercise, adequate sleep. Consider daily probiotics. Take medications as prescribed 

## 2016-04-17 NOTE — Assessment & Plan Note (Signed)
Well controlled, no changes to meds. Encouraged heart healthy diet such as the DASH diet and exercise as tolerated.  °

## 2016-04-17 NOTE — Assessment & Plan Note (Signed)
Increase leafy greens, consider increased lean red meat and using cast iron cookware. Continue to monitor, report any concerns 

## 2016-04-17 NOTE — Patient Instructions (Addendum)
Calcium 1500 mg daily divided in to 3 equal doses  Vitamin D 2000 IU daily  LIdocaine patches, Aspercreme, Icy Hot and Salon Pas at bed for hip pain    Preventive Care for Adults, Female A healthy lifestyle and preventive care can promote health and wellness. Preventive health guidelines for women include the following key practices.  A routine yearly physical is a good way to check with your health care provider about your health and preventive screening. It is a chance to share any concerns and updates on your health and to receive a thorough exam.  Visit your dentist for a routine exam and preventive care every 6 months. Brush your teeth twice a day and floss once a day. Good oral hygiene prevents tooth decay and gum disease.  The frequency of eye exams is based on your age, health, family medical history, use of contact lenses, and other factors. Follow your health care provider's recommendations for frequency of eye exams.  Eat a healthy diet. Foods like vegetables, fruits, whole grains, low-fat dairy products, and lean protein foods contain the nutrients you need without too many calories. Decrease your intake of foods high in solid fats, added sugars, and salt. Eat the right amount of calories for you.Get information about a proper diet from your health care provider, if necessary.  Regular physical exercise is one of the most important things you can do for your health. Most adults should get at least 150 minutes of moderate-intensity exercise (any activity that increases your heart rate and causes you to sweat) each week. In addition, most adults need muscle-strengthening exercises on 2 or more days a week.  Maintain a healthy weight. The body mass index (BMI) is a screening tool to identify possible weight problems. It provides an estimate of body fat based on height and weight. Your health care provider can find your BMI and can help you achieve or maintain a healthy weight.For  adults 20 years and older:  A BMI below 18.5 is considered underweight.  A BMI of 18.5 to 24.9 is normal.  A BMI of 25 to 29.9 is considered overweight.  A BMI of 30 and above is considered obese.  Maintain normal blood lipids and cholesterol levels by exercising and minimizing your intake of saturated fat. Eat a balanced diet with plenty of fruit and vegetables. Blood tests for lipids and cholesterol should begin at age 48 and be repeated every 5 years. If your lipid or cholesterol levels are high, you are over 50, or you are at high risk for heart disease, you may need your cholesterol levels checked more frequently.Ongoing high lipid and cholesterol levels should be treated with medicines if diet and exercise are not working.  If you smoke, find out from your health care provider how to quit. If you do not use tobacco, do not start.  Lung cancer screening is recommended for adults aged 79-80 years who are at high risk for developing lung cancer because of a history of smoking. A yearly low-dose CT scan of the lungs is recommended for people who have at least a 30-pack-year history of smoking and are a current smoker or have quit within the past 15 years. A pack year of smoking is smoking an average of 1 pack of cigarettes a day for 1 year (for example: 1 pack a day for 30 years or 2 packs a day for 15 years). Yearly screening should continue until the smoker has stopped smoking for at least 15 years.  Yearly screening should be stopped for people who develop a health problem that would prevent them from having lung cancer treatment.  If you are pregnant, do not drink alcohol. If you are breastfeeding, be very cautious about drinking alcohol. If you are not pregnant and choose to drink alcohol, do not have more than 1 drink per day. One drink is considered to be 12 ounces (355 mL) of beer, 5 ounces (148 mL) of wine, or 1.5 ounces (44 mL) of liquor.  Avoid use of street drugs. Do not share needles  with anyone. Ask for help if you need support or instructions about stopping the use of drugs.  High blood pressure causes heart disease and increases the risk of stroke. Your blood pressure should be checked at least every 1 to 2 years. Ongoing high blood pressure should be treated with medicines if weight loss and exercise do not work.  If you are 61-34 years old, ask your health care provider if you should take aspirin to prevent strokes.  Diabetes screening is done by taking a blood sample to check your blood glucose level after you have not eaten for a certain period of time (fasting). If you are not overweight and you do not have risk factors for diabetes, you should be screened once every 3 years starting at age 71. If you are overweight or obese and you are 67-46 years of age, you should be screened for diabetes every year as part of your cardiovascular risk assessment.  Breast cancer screening is essential preventive care for women. You should practice "breast self-awareness." This means understanding the normal appearance and feel of your breasts and may include breast self-examination. Any changes detected, no matter how small, should be reported to a health care provider. Women in their 75s and 30s should have a clinical breast exam (CBE) by a health care provider as part of a regular health exam every 1 to 3 years. After age 35, women should have a CBE every year. Starting at age 28, women should consider having a mammogram (breast X-ray test) every year. Women who have a family history of breast cancer should talk to their health care provider about genetic screening. Women at a high risk of breast cancer should talk to their health care providers about having an MRI and a mammogram every year.  Breast cancer gene (BRCA)-related cancer risk assessment is recommended for women who have family members with BRCA-related cancers. BRCA-related cancers include breast, ovarian, tubal, and peritoneal  cancers. Having family members with these cancers may be associated with an increased risk for harmful changes (mutations) in the breast cancer genes BRCA1 and BRCA2. Results of the assessment will determine the need for genetic counseling and BRCA1 and BRCA2 testing.  Your health care provider may recommend that you be screened regularly for cancer of the pelvic organs (ovaries, uterus, and vagina). This screening involves a pelvic examination, including checking for microscopic changes to the surface of your cervix (Pap test). You may be encouraged to have this screening done every 3 years, beginning at age 51.  For women ages 70-65, health care providers may recommend pelvic exams and Pap testing every 3 years, or they may recommend the Pap and pelvic exam, combined with testing for human papilloma virus (HPV), every 5 years. Some types of HPV increase your risk of cervical cancer. Testing for HPV may also be done on women of any age with unclear Pap test results.  Other health care providers may not  recommend any screening for nonpregnant women who are considered low risk for pelvic cancer and who do not have symptoms. Ask your health care provider if a screening pelvic exam is right for you.  If you have had past treatment for cervical cancer or a condition that could lead to cancer, you need Pap tests and screening for cancer for at least 20 years after your treatment. If Pap tests have been discontinued, your risk factors (such as having a new sexual partner) need to be reassessed to determine if screening should resume. Some women have medical problems that increase the chance of getting cervical cancer. In these cases, your health care provider may recommend more frequent screening and Pap tests.  Colorectal cancer can be detected and often prevented. Most routine colorectal cancer screening begins at the age of 64 years and continues through age 26 years. However, your health care provider may  recommend screening at an earlier age if you have risk factors for colon cancer. On a yearly basis, your health care provider may provide home test kits to check for hidden blood in the stool. Use of a small camera at the end of a tube, to directly examine the colon (sigmoidoscopy or colonoscopy), can detect the earliest forms of colorectal cancer. Talk to your health care provider about this at age 81, when routine screening begins. Direct exam of the colon should be repeated every 5-10 years through age 68 years, unless early forms of precancerous polyps or small growths are found.  People who are at an increased risk for hepatitis B should be screened for this virus. You are considered at high risk for hepatitis B if:  You were born in a country where hepatitis B occurs often. Talk with your health care provider about which countries are considered high risk.  Your parents were born in a high-risk country and you have not received a shot to protect against hepatitis B (hepatitis B vaccine).  You have HIV or AIDS.  You use needles to inject street drugs.  You live with, or have sex with, someone who has hepatitis B.  You get hemodialysis treatment.  You take certain medicines for conditions like cancer, organ transplantation, and autoimmune conditions.  Hepatitis C blood testing is recommended for all people born from [birth year] through 1965 and any individual with known risks for hepatitis C.  Practice safe sex. Use condoms and avoid high-risk sexual practices to reduce the spread of sexually transmitted infections (STIs). STIs include gonorrhea, chlamydia, syphilis, trichomonas, herpes, HPV, and human immunodeficiency virus (HIV). Herpes, HIV, and HPV are viral illnesses that have no cure. They can result in disability, cancer, and death.  You should be screened for sexually transmitted illnesses (STIs) including gonorrhea and chlamydia if:  You are sexually active and are younger than 24  years.  You are older than 24 years and your health care provider tells you that you are at risk for this type of infection.  Your sexual activity has changed since you were last screened and you are at an increased risk for chlamydia or gonorrhea. Ask your health care provider if you are at risk.  If you are at risk of being infected with HIV, it is recommended that you take a prescription medicine daily to prevent HIV infection. This is called preexposure prophylaxis (PrEP). You are considered at risk if:  You are sexually active and do not regularly use condoms or know the HIV status of your partner(s).  You take drugs  by injection.  You are sexually active with a partner who has HIV.  Talk with your health care provider about whether you are at high risk of being infected with HIV. If you choose to begin PrEP, you should first be tested for HIV. You should then be tested every 3 months for as long as you are taking PrEP.  Osteoporosis is a disease in which the bones lose minerals and strength with aging. This can result in serious bone fractures or breaks. The risk of osteoporosis can be identified using a bone density scan. Women ages 60 years and over and women at risk for fractures or osteoporosis should discuss screening with their health care providers. Ask your health care provider whether you should take a calcium supplement or vitamin D to reduce the rate of osteoporosis.  Menopause can be associated with physical symptoms and risks. Hormone replacement therapy is available to decrease symptoms and risks. You should talk to your health care provider about whether hormone replacement therapy is right for you.  Use sunscreen. Apply sunscreen liberally and repeatedly throughout the day. You should seek shade when your shadow is shorter than you. Protect yourself by wearing long sleeves, pants, a wide-brimmed hat, and sunglasses year round, whenever you are outdoors.  Once a month, do a  whole body skin exam, using a mirror to look at the skin on your back. Tell your health care provider of new moles, moles that have irregular borders, moles that are larger than a pencil eraser, or moles that have changed in shape or color.  Stay current with required vaccines (immunizations).  Influenza vaccine. All adults should be immunized every year.  Tetanus, diphtheria, and acellular pertussis (Td, Tdap) vaccine. Pregnant women should receive 1 dose of Tdap vaccine during each pregnancy. The dose should be obtained regardless of the length of time since the last dose. Immunization is preferred during the 27th-36th week of gestation. An adult who has not previously received Tdap or who does not know her vaccine status should receive 1 dose of Tdap. This initial dose should be followed by tetanus and diphtheria toxoids (Td) booster doses every 10 years. Adults with an unknown or incomplete history of completing a 3-dose immunization series with Td-containing vaccines should begin or complete a primary immunization series including a Tdap dose. Adults should receive a Td booster every 10 years.  Varicella vaccine. An adult without evidence of immunity to varicella should receive 2 doses or a second dose if she has previously received 1 dose. Pregnant females who do not have evidence of immunity should receive the first dose after pregnancy. This first dose should be obtained before leaving the health care facility. The second dose should be obtained 4-8 weeks after the first dose.  Human papillomavirus (HPV) vaccine. Females aged 13-26 years who have not received the vaccine previously should obtain the 3-dose series. The vaccine is not recommended for use in pregnant females. However, pregnancy testing is not needed before receiving a dose. If a female is found to be pregnant after receiving a dose, no treatment is needed. In that case, the remaining doses should be delayed until after the pregnancy.  Immunization is recommended for any person with an immunocompromised condition through the age of 95 years if she did not get any or all doses earlier. During the 3-dose series, the second dose should be obtained 4-8 weeks after the first dose. The third dose should be obtained 24 weeks after the first dose and 16  weeks after the second dose.  Zoster vaccine. One dose is recommended for adults aged 85 years or older unless certain conditions are present.  Measles, mumps, and rubella (MMR) vaccine. Adults born before [birth year] generally are considered immune to measles and mumps. Adults born in [birth year] or later should have 1 or more doses of MMR vaccine unless there is a contraindication to the vaccine or there is laboratory evidence of immunity to each of the three diseases. A routine second dose of MMR vaccine should be obtained at least 28 days after the first dose for students attending postsecondary schools, health care workers, or international travelers. People who received inactivated measles vaccine or an unknown type of measles vaccine during 1963-1967 should receive 2 doses of MMR vaccine. People who received inactivated mumps vaccine or an unknown type of mumps vaccine before 1979 and are at high risk for mumps infection should consider immunization with 2 doses of MMR vaccine. For females of childbearing age, rubella immunity should be determined. If there is no evidence of immunity, females who are not pregnant should be vaccinated. If there is no evidence of immunity, females who are pregnant should delay immunization until after pregnancy. Unvaccinated health care workers born before [birth year] who lack laboratory evidence of measles, mumps, or rubella immunity or laboratory confirmation of disease should consider measles and mumps immunization with 2 doses of MMR vaccine or rubella immunization with 1 dose of MMR vaccine.  Pneumococcal 13-valent conjugate (PCV13) vaccine. When indicated, a person who is  uncertain of his immunization history and has no record of immunization should receive the PCV13 vaccine. All adults 69 years of age and older should receive this vaccine. An adult aged 20 years or older who has certain medical conditions and has not been previously immunized should receive 1 dose of PCV13 vaccine. This PCV13 should be followed with a dose of pneumococcal polysaccharide (PPSV23) vaccine. Adults who are at high risk for pneumococcal disease should obtain the PPSV23 vaccine at least 8 weeks after the dose of PCV13 vaccine. Adults older than 68 years of age who have normal immune system function should obtain the PPSV23 vaccine dose at least 1 year after the dose of PCV13 vaccine.  Pneumococcal polysaccharide (PPSV23) vaccine. When PCV13 is also indicated, PCV13 should be obtained first. All adults aged 37 years and older should be immunized. An adult younger than age 12 years who has certain medical conditions should be immunized. Any person who resides in a nursing home or long-term care facility should be immunized. An adult smoker should be immunized. People with an immunocompromised condition and certain other conditions should receive both PCV13 and PPSV23 vaccines. People with human immunodeficiency virus (HIV) infection should be immunized as soon as possible after diagnosis. Immunization during chemotherapy or radiation therapy should be avoided. Routine use of PPSV23 vaccine is not recommended for American Indians, [place] Natives, or people younger than 65 years unless there are medical conditions that require PPSV23 vaccine. When indicated, people who have unknown immunization and have no record of immunization should receive PPSV23 vaccine. One-time revaccination 5 years after the first dose of PPSV23 is recommended for people aged 19-64 years who have chronic kidney failure, nephrotic syndrome, asplenia, or immunocompromised conditions. People who received 1-2 doses of PPSV23 before age  54 years should receive another dose of PPSV23 vaccine at age 45 years or later if at least 5 years have passed since the previous dose. Doses of PPSV23 are not needed for people immunized with  PPSV23 at or after age 62 years.  Meningococcal vaccine. Adults with asplenia or persistent complement component deficiencies should receive 2 doses of quadrivalent meningococcal conjugate (MenACWY-D) vaccine. The doses should be obtained at least 2 months apart. Microbiologists working with certain meningococcal bacteria, [employer] recruits, people at risk during an outbreak, and people who travel to or live in countries with a high rate of meningitis should be immunized. A first-year college student up through age 69 years who is living in a residence hall should receive a dose if she did not receive a dose on or after her 16th birthday. Adults who have certain high-risk conditions should receive one or more doses of vaccine.  Hepatitis A vaccine. Adults who wish to be protected from this disease, have certain high-risk conditions, work with hepatitis A-infected animals, work in hepatitis A research labs, or travel to or work in countries with a high rate of hepatitis A should be immunized. Adults who were previously unvaccinated and who anticipate close contact with an international adoptee during the first 60 days after arrival in the [place] from a country with a high rate of hepatitis A should be immunized.  Hepatitis B vaccine. Adults who wish to be protected from this disease, have certain high-risk conditions, may be exposed to blood or other infectious body fluids, are household contacts or sex partners of hepatitis B positive people, are clients or workers in certain care facilities, or travel to or work in countries with a high rate of hepatitis B should be immunized.  Haemophilus influenzae type b (Hib) vaccine. A previously unvaccinated person with asplenia or sickle cell disease or having a  scheduled splenectomy should receive 1 dose of Hib vaccine. Regardless of previous immunization, a recipient of a hematopoietic stem cell transplant should receive a 3-dose series 6-12 months after her successful transplant. Hib vaccine is not recommended for adults with HIV infection. Preventive Services / Frequency Ages 33 to 29 years  Blood pressure check.** / Every 3-5 years.  Lipid and cholesterol check.** / Every 5 years beginning at age 76.  Clinical breast exam.** / Every 3 years for women in their 16s and 71s.  BRCA-related cancer risk assessment.** / For women who have family members with a BRCA-related cancer (breast, ovarian, tubal, or peritoneal cancers).  Pap test.** / Every 2 years from ages 43 through 63. Every 3 years starting at age 60 through age 30 or 19 with a history of 3 consecutive normal Pap tests.  HPV screening.** / Every 3 years from ages 74 through ages 2 to 11 with a history of 3 consecutive normal Pap tests.  Hepatitis C blood test.** / For any individual with known risks for hepatitis C.  Skin self-exam. / Monthly.  Influenza vaccine. / Every year.  Tetanus, diphtheria, and acellular pertussis (Tdap, Td) vaccine.** / Consult your health care provider. Pregnant women should receive 1 dose of Tdap vaccine during each pregnancy. 1 dose of Td every 10 years.  Varicella vaccine.** / Consult your health care provider. Pregnant females who do not have evidence of immunity should receive the first dose after pregnancy.  HPV vaccine. / 3 doses over 6 months, if 52 and younger. The vaccine is not recommended for use in pregnant females. However, pregnancy testing is not needed before receiving a dose.  Measles, mumps, rubella (MMR) vaccine.** / You need at least 1 dose of MMR if you were born in 1957 or later. You may also need a 2nd dose. For females  of childbearing age, rubella immunity should be determined. If there is no evidence of immunity, females who are not  pregnant should be vaccinated. If there is no evidence of immunity, females who are pregnant should delay immunization until after pregnancy.  Pneumococcal 13-valent conjugate (PCV13) vaccine.** / Consult your health care provider.  Pneumococcal polysaccharide (PPSV23) vaccine.** / 1 to 2 doses if you smoke cigarettes or if you have certain conditions.  Meningococcal vaccine.** / 1 dose if you are age 51 to 7 years and a Market researcher living in a residence hall, or have one of several medical conditions, you need to get vaccinated against meningococcal disease. You may also need additional booster doses.  Hepatitis A vaccine.** / Consult your health care provider.  Hepatitis B vaccine.** / Consult your health care provider.  Haemophilus influenzae type b (Hib) vaccine.** / Consult your health care provider. Ages 91 to 32 years  Blood pressure check.** / Every year.  Lipid and cholesterol check.** / Every 5 years beginning at age 54 years.  Lung cancer screening. / Every year if you are aged 65-80 years and have a 30-pack-year history of smoking and currently smoke or have quit within the past 15 years. Yearly screening is stopped once you have quit smoking for at least 15 years or develop a health problem that would prevent you from having lung cancer treatment.  Clinical breast exam.** / Every year after age 54 years.  BRCA-related cancer risk assessment.** / For women who have family members with a BRCA-related cancer (breast, ovarian, tubal, or peritoneal cancers).  Mammogram.** / Every year beginning at age 4 years and continuing for as long as you are in good health. Consult with your health care provider.  Pap test.** / Every 3 years starting at age 24 years through age 58 or 16 years with a history of 3 consecutive normal Pap tests.  HPV screening.** / Every 3 years from ages 76 years through ages 22 to 58 years with a history of 3 consecutive normal Pap  tests.  Fecal occult blood test (FOBT) of stool. / Every year beginning at age 48 years and continuing until age 40 years. You may not need to do this test if you get a colonoscopy every 10 years.  Flexible sigmoidoscopy or colonoscopy.** / Every 5 years for a flexible sigmoidoscopy or every 10 years for a colonoscopy beginning at age 54 years and continuing until age 70 years.  Hepatitis C blood test.** / For all people born from [birth year] through 1963 and any individual with known risks for hepatitis C.  Skin self-exam. / Monthly.  Influenza vaccine. / Every year.  Tetanus, diphtheria, and acellular pertussis (Tdap/Td) vaccine.** / Consult your health care provider. Pregnant women should receive 1 dose of Tdap vaccine during each pregnancy. 1 dose of Td every 10 years.  Varicella vaccine.** / Consult your health care provider. Pregnant females who do not have evidence of immunity should receive the first dose after pregnancy.  Zoster vaccine.** / 1 dose for adults aged 42 years or older.  Measles, mumps, rubella (MMR) vaccine.** / You need at least 1 dose of MMR if you were born in 1957 or later. You may also need a second dose. For females of childbearing age, rubella immunity should be determined. If there is no evidence of immunity, females who are not pregnant should be vaccinated. If there is no evidence of immunity, females who are pregnant should delay immunization until after pregnancy.  Pneumococcal 13-valent  conjugate (PCV13) vaccine.** / Consult your health care provider.  Pneumococcal polysaccharide (PPSV23) vaccine.** / 1 to 2 doses if you smoke cigarettes or if you have certain conditions.  Meningococcal vaccine.** / Consult your health care provider.  Hepatitis A vaccine.** / Consult your health care provider.  Hepatitis B vaccine.** / Consult your health care provider.  Haemophilus influenzae type b (Hib) vaccine.** / Consult your health care provider. Ages 27 years and  over  Blood pressure check.** / Every year.  Lipid and cholesterol check.** / Every 5 years beginning at age 36 years.  Lung cancer screening. / Every year if you are aged 61-80 years and have a 30-pack-year history of smoking and currently smoke or have quit within the past 15 years. Yearly screening is stopped once you have quit smoking for at least 15 years or develop a health problem that would prevent you from having lung cancer treatment.  Clinical breast exam.** / Every year after age 39 years.  BRCA-related cancer risk assessment.** / For women who have family members with a BRCA-related cancer (breast, ovarian, tubal, or peritoneal cancers).  Mammogram.** / Every year beginning at age 60 years and continuing for as long as you are in good health. Consult with your health care provider.  Pap test.** / Every 3 years starting at age 30 years through age 23 or 83 years with 3 consecutive normal Pap tests. Testing can be stopped between 65 and 70 years with 3 consecutive normal Pap tests and no abnormal Pap or HPV tests in the past 10 years.  HPV screening.** / Every 3 years from ages 74 years through ages 26 or 27 years with a history of 3 consecutive normal Pap tests. Testing can be stopped between 65 and 70 years with 3 consecutive normal Pap tests and no abnormal Pap or HPV tests in the past 10 years.  Fecal occult blood test (FOBT) of stool. / Every year beginning at age 37 years and continuing until age 35 years. You may not need to do this test if you get a colonoscopy every 10 years.  Flexible sigmoidoscopy or colonoscopy.** / Every 5 years for a flexible sigmoidoscopy or every 10 years for a colonoscopy beginning at age 40 years and continuing until age 73 years.  Hepatitis C blood test.** / For all people born from [birth year] through 1963 and any individual with known risks for hepatitis C.  Osteoporosis screening.** / A one-time screening for women ages 70 years and over and women  at risk for fractures or osteoporosis.  Skin self-exam. / Monthly.  Influenza vaccine. / Every year.  Tetanus, diphtheria, and acellular pertussis (Tdap/Td) vaccine.** / 1 dose of Td every 10 years.  Varicella vaccine.** / Consult your health care provider.  Zoster vaccine.** / 1 dose for adults aged 59 years or older.  Pneumococcal 13-valent conjugate (PCV13) vaccine.** / Consult your health care provider.  Pneumococcal polysaccharide (PPSV23) vaccine.** / 1 dose for all adults aged 61 years and older.  Meningococcal vaccine.** / Consult your health care provider.  Hepatitis A vaccine.** / Consult your health care provider.  Hepatitis B vaccine.** / Consult your health care provider.  Haemophilus influenzae type b (Hib) vaccine.** / Consult your health care provider. ** Family history and personal history of risk and conditions may change your health care provider's recommendations.   This information is not intended to replace advice given to you by your health care provider. Make sure you discuss any questions you have with your  health care provider.   Document Released: 07/29/2001 Document Revised: 06/23/2014 Document Reviewed: 10/28/2010 Elsevier Interactive Patient Education [employer].

## 2016-04-17 NOTE — Progress Notes (Addendum)
Subjective:   Morgan Medina is a 70 y.o. female who presents for an Initial Medicare Annual Wellness Visit.  Review of Systems    No ROS.  Medicare Wellness Visit.  Cardiac Risk Factors include: obesity (BMI >30kg/m2);advanced age (>62mn, >>48women);hypertension;dyslipidemia;sedentary lifestyle  Sleep patterns: No sleep issues. Gets up once nightly to void.     Home Safety/Smoke Alarms: Lives alone in home. Feels safe in home. Smoke alarms present.   Living environment; residence and Firearm Safety: No firearms.  Seat Belt Safety/Bike Helmet: Wears seat belt.   Counseling:   [facility] Dental- Dr. HTheda Belfasttwice yearly  Female:   Pap- Hysterectomy       Mammo- 09/27/14 BI-RADS CATEGORY  1: Negative.       Dexa scan- 11/22/15 Osteopenia        CCS- Not on file. Pt to fill out records release for [facility], Dr. LTruman Hayward       Objective:    Today's Vitals   04/17/16 1329  BP: 130/82  Pulse: 62  Temp: 98.2 F (36.8 C)  TempSrc: Oral  SpO2: 96%  Weight: 202 lb 6 oz (91.8 kg)  Height: '5\' 8"'  (1.727 m)   Body mass index is 30.77 kg/m.   Current Medications (verified) Outpatient Encounter Prescriptions as of 04/17/2016  Medication Sig  . amLODipine (NORVASC) 5 MG tablet TAKE 1 TABLET (5 MG TOTAL) BY MOUTH DAILY.  .Marland Kitchenaspirin 81 MG tablet Takes every other day  . atenolol (TENORMIN) 50 MG tablet TAKE 1 TABLET (50 MG TOTAL) BY MOUTH DAILY.  .Marland KitchenBlood Glucose Monitoring Suppl ([place] W/DEVICE KIT Use to check blood sugar. DX. E11.9  . Cyanocobalamin (VITAMIN B 12) 250 MCG LOZG Take by mouth.  . ferrous sulfate 325 (65 FE) MG tablet Take 325 mg by mouth daily with breakfast.  . furosemide (LASIX) 20 MG tablet Take two tablets every morning  . glucose blood (ONE TOUCH ULTRA TEST) test strip Use as idirected once daily.  DX E11.9  . glucose blood test strip Test once daily to check blood sugar.  DX E11.9  .  losartan (COZAAR) 100 MG tablet TAKE 1 TABLET BY MOUTH EVERY MORNING  . Multiple Vitamin (MULTIVITAMIN) capsule Take 1 capsule by mouth daily.  . ONE TOUCH LANCETS MISC Use as directed once daily to check blood sugar. DX: E11.9  . potassium chloride SA (K-DUR,KLOR-CON) 20 MEQ tablet Take 1 tablet (20 mEq total) by mouth daily.  . simvastatin (ZOCOR) 20 MG tablet TAKE 1 TABLET BY MOUTH DAILY   No facility-administered encounter medications on file as of 04/17/2016.     Allergies (verified) Review of patient's allergies indicates no known allergies.   History: Past Medical History:  Diagnosis Date  . Anemia   . Cerebral aneurysm    h/o in 2006  . Dacrocystitis 07/19/2015   right  . Diabetes mellitus type 2 in obese ([place] 11/05/2014  . GERD (gastroesophageal reflux disease) 04/17/2016  . Hyperlipidemia   . Hypertension   . Osteoarthritis of left hip 12/02/2015  . Osteopenia 11/22/2015  . Overweight   . Preventative health care 11/22/2015   Past Surgical History:  Procedure Laterality Date  . ABDOMINAL HYSTERECTOMY    . CEREBRAL ANEURYSM REPAIR     Family History  Problem Relation Age of Onset  . Cerebral aneurysm Father   . Alzheimer's disease Mother   . Cancer Maternal  Aunt     breast  . Diabetes Maternal Grandmother   . Stroke Maternal Grandfather   . Alzheimer's disease Paternal Grandmother   . Stroke Paternal Grandfather    Social History   Occupational History  . Not on file.   Social History Main Topics  . Smoking status: Never Smoker  . Smokeless tobacco: Never Used  . Alcohol use No  . Drug use: No  . Sexual activity: No     Comment: lives with self, no dietary restrictions. works as a Theme park manager, retired from state employment    Tobacco Counseling Counseling given: Yes   Activities of [place] In your present state of health, do you have any difficulty performing the following activities: 04/17/2016 11/22/2015  Hearing? N N  Vision? N Y  Difficulty  concentrating or making decisions? N N  Walking or climbing stairs? N N  Dressing or bathing? N N  Doing errands, shopping? N N  Preparing Food and eating ? N -  Using the Toilet? N -  In the past six months, have you accidently leaked urine? N -  Do you have problems with loss of bowel control? N -  Managing your Medications? N -  Managing your Finances? N -  Housekeeping or managing your Housekeeping? N -  Some recent data might be hidden    Immunizations and Health Maintenance Immunization History  Administered Date(s) Administered  . Influenza, High Dose Seasonal PF 04/17/2016  . Influenza,inj,Quad PF,36+ Mos 04/11/2014, 05/23/2015   Health Maintenance Due  Topic Date Due  . Hepatitis C Screening  02/24/1946  . FOOT EXAM  09/04/1957  . OPHTHALMOLOGY EXAM  09/04/1957  . ZOSTAVAX  09/05/2007  . PNA vac Low Risk Adult (1 of 2 - PCV13) 09/04/2012  . INFLUENZA VACCINE  01/15/2016    Patient Care Team: Mosie Lukes, MD as PCP - General (Family Medicine) Rolley Sims, OD as Consulting Physician (Optometry)  Indicate any recent Medical Services you may have received from other than [facility] providers in the past year (date may be approximate).     Assessment:   This is a routine wellness examination for Morgan Medina. Physical assessment deferred to PCP.  Hearing/Vision screen Hearing Screening Comments: Able to hear conversational tones w/o difficulty. No issues reported. Vision Screening Comments: Follows w/ Dr. Julien Girt. Wearing glasses today but normally ears contacts. Reports has early cataracts.  Dietary issues and exercise activities discussed: Current Exercise Habits: The patient does not participate in regular exercise at present   Diet (meal preparation, eat out, water intake, caffeinated beverages, dairy products, fruits and vegetables): on average, 2-3 meals per day, high fat/ cholesterol. Does not usually eat breakfast. Does not cook, so usually eats out or prepares  simple meals at home. Drinks regular coffee w/ meals and before bed. Drinks bottled water throughout the day.    24 Hour Recall: Breakfast: Oatmeal and cheese toast and beef sausage Lunch: None (ate breakfast late) Dinner: Tuna salad, steak fries, banana nut bread      Goals    . Increase physical activity      Depression Screen PHQ 2/9 Scores 04/17/2016 11/22/2015 05/02/2014  PHQ - 2 Score 0 0 0    Fall Risk Fall Risk  04/17/2016 11/22/2015 05/02/2014 01/04/2014  Falls in the past year? Yes No No No  Number falls in past yr: 1 - - -  Injury with Fall? No - - -    Cognitive Function: MMSE - Mini Mental State Exam 04/17/2016  Orientation to time 5  Orientation to Place 5  Registration 3  Attention/ Calculation 5  Recall 3  Language- name 2 objects 2  Language- repeat 1  Language- follow 3 step command 3  Language- read & follow direction 1  Write a sentence 1  Copy design 1  Total score 30        Screening Tests Health Maintenance  Topic Date Due  . Hepatitis C Screening  [birth year]  . FOOT EXAM  09/04/1957  . OPHTHALMOLOGY EXAM  09/04/1957  . ZOSTAVAX  09/05/2007  . PNA vac Low Risk Adult (1 of 2 - PCV13) 09/04/2012  . INFLUENZA VACCINE  01/15/2016  . HEMOGLOBIN A1C  05/23/2016  . MAMMOGRAM  09/26/2016  . COLONOSCOPY  06/15/2020  . TETANUS/TDAP  06/15/2020  . DEXA SCAN  Addressed      Plan:   Follow-up with Dr. Charlett Blake as directed.  MMG ordered by PCP.   Pt to fill out records release for eye doctor and GI at checkout.  During the course of the visit, Morgan Medina was educated and counseled about the following appropriate screening and preventive services:   Vaccines to include Pneumoccal, Influenza, Hepatitis B, Td, Zostavax, HCV  Cardiovascular disease screening  Colorectal cancer screening  Bone density screening  Diabetes screening  Glaucoma screening  Mammography/PAP  Nutrition counseling  Patient Instructions (the written plan) were given to  the patient.    Dorrene German, RN   04/17/2016     RN AWV note reviewed. Agree with documention and plan.  Penni Homans, MD

## 2016-04-18 LAB — COMPREHENSIVE METABOLIC PANEL
ALT: 12 U/L (ref 0–35)
AST: 13 U/L (ref 0–37)
Albumin: 4.5 g/dL (ref 3.5–5.2)
Alkaline Phosphatase: 60 U/L (ref 39–117)
BUN: 13 mg/dL (ref 6–23)
CO2: 32 mEq/L (ref 19–32)
Calcium: 10.1 mg/dL (ref 8.4–10.5)
Chloride: 101 mEq/L (ref 96–112)
Creatinine, Ser: 0.81 mg/dL (ref 0.40–1.20)
GFR: 90.26 mL/min (ref >60.00)
Glucose, Bld: 81 mg/dL (ref 70–99)
Potassium: 4.2 mEq/L (ref 3.5–5.1)
Sodium: 142 mEq/L (ref 135–145)
Total Bilirubin: 0.2 mg/dL (ref 0.2–1.2)
Total Protein: 7.7 g/dL (ref 6.0–8.3)

## 2016-04-18 LAB — LIPID PANEL
Cholesterol: 209 mg/dL — ABNORMAL HIGH (ref 0–200)
HDL: 57.5 mg/dL (ref >39.00)
NonHDL: 151.84
Total CHOL/HDL Ratio: 4
Triglycerides: 229 mg/dL — ABNORMAL HIGH (ref 0.0–149.0)
VLDL: 45.8 mg/dL — ABNORMAL HIGH (ref 0.0–40.0)

## 2016-04-18 LAB — CBC
HCT: 37.9 % (ref 36.0–46.0)
Hemoglobin: 12.2 g/dL (ref 12.0–15.0)
MCHC: 32.1 g/dL (ref 30.0–36.0)
MCV: 74.2 fl — ABNORMAL LOW (ref 78.0–100.0)
Platelets: 283 10*3/uL (ref 150.0–400.0)
RBC: 5.11 Mil/uL (ref 3.87–5.11)
RDW: 14.1 % (ref 11.5–15.5)
WBC: 11.3 10*3/uL — ABNORMAL HIGH (ref 4.0–10.5)

## 2016-04-18 LAB — LDL CHOLESTEROL, DIRECT: Direct LDL: 127 mg/dL

## 2016-04-18 LAB — TSH: TSH: 2.17 u[IU]/mL (ref 0.35–4.50)

## 2016-04-18 LAB — HEMOGLOBIN A1C: Hgb A1c MFr Bld: 6.9 % — ABNORMAL HIGH (ref 4.6–6.5)

## 2016-04-20 NOTE — Progress Notes (Signed)
Patient ID: Morgan Medina, female   DOB: [birth year], 70 y.o.   MRN: [id number]   Subjective:    Patient ID: Morgan Medina, female    DOB: [birth year], 70 y.o.   MRN: [id number]  Chief Complaint  Patient presents with  . Annual Exam    HPI Patient is in today for annual preventative exam. No recent illness but did note elevated blood pressure yesterday at 170/95 but today the blood pressure is better today. Also notes some trouble with left hip pain but denies any falls or injury. Sugars have been running 120s to 150s typically. Also has restless sleep and snoring. Denies CP/palp/SOB/HA/congestion/fevers/GI or GU c/o. Taking meds as prescribed  Past Medical History:  Diagnosis Date  . Anemia   . Cerebral aneurysm    h/o in 2006  . Dacrocystitis 07/19/2015   right  . Diabetes mellitus type 2 in obese ([place]) 11/05/2014  . GERD (gastroesophageal reflux disease) 04/17/2016  . Hyperlipidemia   . Hypertension   . Osteoarthritis of left hip 12/02/2015  . Osteopenia 11/22/2015  . Overweight     Past Surgical History:  Procedure Laterality Date  . ABDOMINAL HYSTERECTOMY    . CEREBRAL ANEURYSM REPAIR      Family History  Problem Relation Age of Onset  . Cerebral aneurysm Father   . Alzheimer's disease Mother   . Cancer Maternal Aunt     breast  . Diabetes Maternal Grandmother   . Stroke Maternal Grandfather   . Alzheimer's disease Paternal Grandmother   . Stroke Paternal Grandfather     Social History   Social History  . Marital status: Widowed    Spouse name: N/A  . Number of children: N/A  . Years of education: N/A   Occupational History  . Not on file.   Social History Main Topics  . Smoking status: Never Smoker  . Smokeless tobacco: Never Used  . Alcohol use No  . Drug use: No  . Sexual activity: No     Comment: lives with self, no dietary restrictions. works as a Theme park manager, retired from state employment   Other Topics Concern  . Not on file   Social History Narrative    . No narrative on file    Outpatient Medications Prior to Visit  Medication Sig Dispense Refill  . amLODipine (NORVASC) 5 MG tablet TAKE 1 TABLET (5 MG TOTAL) BY MOUTH DAILY. 90 tablet 3  . aspirin 81 MG tablet Takes every other day    . atenolol (TENORMIN) 50 MG tablet TAKE 1 TABLET (50 MG TOTAL) BY MOUTH DAILY. 90 tablet 2  . Blood Glucose Monitoring Suppl ([place]) W/DEVICE KIT Use to check blood sugar. DX. E11.9 1 each 0  . Cyanocobalamin (VITAMIN B 12) 250 MCG LOZG Take by mouth.    . ferrous sulfate 325 (65 FE) MG tablet Take 325 mg by mouth daily with breakfast.    . furosemide (LASIX) 20 MG tablet Take two tablets every morning 180 tablet 2  . glucose blood (ONE TOUCH ULTRA TEST) test strip Use as idirected once daily.  DX E11.9 100 each 6  . glucose blood test strip Test once daily to check blood sugar.  DX E11.9 100 each 11  . losartan (COZAAR) 100 MG tablet TAKE 1 TABLET BY MOUTH EVERY MORNING 90 tablet 1  . Multiple Vitamin (MULTIVITAMIN) capsule Take 1 capsule by mouth daily.    . ONE TOUCH LANCETS MISC Use as directed once daily to check blood  sugar. DX: E11.9 100 each 0  . potassium chloride SA (K-DUR,KLOR-CON) 20 MEQ tablet Take 1 tablet (20 mEq total) by mouth daily. 90 tablet 1  . simvastatin (ZOCOR) 20 MG tablet TAKE 1 TABLET BY MOUTH DAILY 90 tablet 1   No facility-administered medications prior to visit.     No Known Allergies  Review of Systems  Constitutional: Negative for fever and malaise/fatigue.  HENT: Negative for congestion.   Eyes: Negative for blurred vision.  Respiratory: Negative for shortness of breath.   Cardiovascular: Negative for chest pain, palpitations and leg swelling.  Gastrointestinal: Negative for abdominal pain, blood in stool and nausea.  Genitourinary: Negative for dysuria and frequency.  Musculoskeletal: Positive for joint pain. Negative for falls.  Skin: Negative for rash.  Neurological: Negative for dizziness, loss  of consciousness and headaches.  Endo/Heme/Allergies: Negative for environmental allergies.  Psychiatric/Behavioral: Negative for depression. The patient is not nervous/anxious.        Objective:    Physical Exam  Constitutional: She is oriented to person, place, and time. She appears well-developed and well-nourished. No distress.  HENT:  Head: Normocephalic and atraumatic.  Eyes: Conjunctivae are normal.  Neck: Neck supple. No thyromegaly present.  Cardiovascular: Normal rate, regular rhythm and normal heart sounds.   No murmur heard. Pulmonary/Chest: Effort normal and breath sounds normal. No respiratory distress.  Abdominal: Soft. Bowel sounds are normal. She exhibits no distension and no mass. There is no tenderness.  Musculoskeletal: She exhibits no edema.  Lymphadenopathy:    She has no cervical adenopathy.  Neurological: She is alert and oriented to person, place, and time.  Skin: Skin is warm and dry.  Psychiatric: She has a normal mood and affect. Her behavior is normal.    BP 130/82 (BP Location: Left Arm, Patient Position: Sitting, Cuff Size: Large)   Pulse 62   Temp 98.2 F (36.8 C) (Oral)   Ht '5\' 8"'  (1.727 m)   Wt 202 lb 6 oz (91.8 kg)   SpO2 96%   BMI 30.77 kg/m  Wt Readings from Last 3 Encounters:  04/17/16 202 lb 6 oz (91.8 kg)  11/22/15 203 lb 6 oz (92.3 kg)  07/19/15 201 lb 8 oz (91.4 kg)     Lab Results  Component Value Date   WBC 11.3 (H) 04/17/2016   HGB 12.2 04/17/2016   HCT 37.9 04/17/2016   PLT 283.0 04/17/2016   GLUCOSE 81 04/17/2016   CHOL 209 (H) 04/17/2016   TRIG 229.0 (H) 04/17/2016   HDL 57.50 04/17/2016   LDLDIRECT 127.0 04/17/2016   LDLCALC 112 (H) 11/22/2015   ALT 12 04/17/2016   AST 13 04/17/2016   NA 142 04/17/2016   K 4.2 04/17/2016   CL 101 04/17/2016   CREATININE 0.81 04/17/2016   BUN 13 04/17/2016   CO2 32 04/17/2016   TSH 2.17 04/17/2016   HGBA1C 6.9 (H) 04/17/2016    Lab Results  Component Value Date   TSH  2.17 04/17/2016   Lab Results  Component Value Date   WBC 11.3 (H) 04/17/2016   HGB 12.2 04/17/2016   HCT 37.9 04/17/2016   MCV 74.2 (L) 04/17/2016   PLT 283.0 04/17/2016   Lab Results  Component Value Date   NA 142 04/17/2016   K 4.2 04/17/2016   CO2 32 04/17/2016   GLUCOSE 81 04/17/2016   BUN 13 04/17/2016   CREATININE 0.81 04/17/2016   BILITOT 0.2 04/17/2016   ALKPHOS 60 04/17/2016   AST 13 04/17/2016  ALT 12 04/17/2016   PROT 7.7 04/17/2016   ALBUMIN 4.5 04/17/2016   CALCIUM 10.1 04/17/2016   ANIONGAP 8 07/04/2014   GFR 90.26 04/17/2016   Lab Results  Component Value Date   CHOL 209 (H) 04/17/2016   Lab Results  Component Value Date   HDL 57.50 04/17/2016   Lab Results  Component Value Date   LDLCALC 112 (H) 11/22/2015   Lab Results  Component Value Date   TRIG 229.0 (H) 04/17/2016   Lab Results  Component Value Date   CHOLHDL 4 04/17/2016   Lab Results  Component Value Date   HGBA1C 6.9 (H) 04/17/2016       Assessment & Plan:   Problem List Items Addressed This Visit    Anemia    Increase leafy greens, consider increased lean red meat and using cast iron cookware. Continue to monitor, report any concerns      Relevant Orders   CBC (Completed)   HTN (hypertension)    Well controlled, no changes to meds. Encouraged heart healthy diet such as the DASH diet and exercise as tolerated.       Relevant Orders   TSH (Completed)   CBC (Completed)   Comprehensive metabolic panel (Completed)   Hyperlipidemia    Tolerating statin, encouraged heart healthy diet, avoid trans fats, minimize simple carbs and saturated fats. Increase exercise as tolerated      Relevant Orders   Lipid panel (Completed)   Colon polyps    Colonoscopy with Dr Truman Hayward at [facility], given 10 years and last colonoscopy a couple years ago and goo      Overweight    Encouraged DASH diet, decrease po intake and increase exercise as tolerated. Needs 7-8 hours of sleep nightly. Avoid  trans fats, eat small, frequent meals every 4-5 hours with lean proteins, complex carbs and healthy fats. Minimize simple carbs,       Diabetes mellitus type 2 in obese ([facility])    hgba1c acceptable, minimize simple carbs. Increase exercise as tolerated. Continue current meds      Relevant Orders   Hemoglobin A1c (Completed)   Preventative health care - Primary    Patient encouraged to maintain heart healthy diet, regular exercise, adequate sleep. Consider daily probiotics. Take medications as prescribed      Osteopenia    Encouraged to get adequate exercise, calcium and vitamin d intake      GERD (gastroesophageal reflux disease)    Roughly 6 months of intermittent heartburn, has not tried any OTC meds. Add probiotic, Tums and Ranitidine as needed       Other Visit Diagnoses    Encounter for immunization       Relevant Orders   Flu vaccine HIGH DOSE PF (Completed)   Breast cancer screening       Relevant Orders   MM Digital Screening   Encounter for Medicare annual wellness exam          I am having Ms. Eaves maintain her multivitamin, ferrous sulfate, Vitamin B 12, aspirin, ONE TOUCH LANCETS, ONE TOUCH BASIC SYSTEM, glucose blood, glucose blood, amLODipine, atenolol, potassium chloride SA, furosemide, losartan, and simvastatin.  No orders of the defined types were placed in this encounter.    Penni Homans, MD

## 2016-04-22 ENCOUNTER — Encounter: Payer: Self-pay | Admitting: Family Medicine

## 2016-04-24 ENCOUNTER — Encounter: Payer: Self-pay | Admitting: Family Medicine

## 2016-05-26 ENCOUNTER — Encounter: Payer: Self-pay | Admitting: Family Medicine

## 2016-05-26 ENCOUNTER — Ambulatory Visit (INDEPENDENT_AMBULATORY_CARE_PROVIDER_SITE_OTHER): Payer: Medicare Other | Admitting: Family Medicine

## 2016-05-26 VITALS — BP 138/78 | HR 57 | Temp 97.9°F | Wt 201.0 lb

## 2016-05-26 DIAGNOSIS — E1169 Type 2 diabetes mellitus with other specified complication: Secondary | ICD-10-CM

## 2016-05-26 DIAGNOSIS — E669 Obesity, unspecified: Secondary | ICD-10-CM

## 2016-05-26 DIAGNOSIS — J209 Acute bronchitis, unspecified: Secondary | ICD-10-CM | POA: Diagnosis not present

## 2016-05-26 DIAGNOSIS — J029 Acute pharyngitis, unspecified: Secondary | ICD-10-CM

## 2016-05-26 DIAGNOSIS — I10 Essential (primary) hypertension: Secondary | ICD-10-CM | POA: Diagnosis not present

## 2016-05-26 LAB — POCT RAPID STREP A (OFFICE): Rapid Strep A Screen: NEGATIVE

## 2016-05-26 MED ORDER — BENZONATATE 100 MG PO CAPS: 100.0000 mg | ORAL_CAPSULE | Freq: Three times a day (TID) | ORAL | 1 refills | Status: DC | PRN

## 2016-05-26 MED ORDER — AMOXICILLIN 500 MG PO CAPS: 500.0000 mg | ORAL_CAPSULE | Freq: Three times a day (TID) | ORAL | 0 refills | Status: DC

## 2016-05-26 NOTE — Patient Instructions (Addendum)
  NOW probiotics 1 cap daily, [facility]  Acute Bronchitis, Adult Acute bronchitis is when air tubes (bronchi) in the lungs suddenly get swollen. The condition can make it hard to breathe. It can also cause these symptoms:  A cough.  Coughing up clear, yellow, or green mucus.  Wheezing.  Chest congestion.  Shortness of breath.  A fever.  Body aches.  Chills.  A sore throat. Follow these instructions at home: Medicines  Take over-the-counter and prescription medicines only as told by your doctor.  If you were prescribed an antibiotic medicine, take it as told by your doctor. Do not stop taking the antibiotic even if you start to feel better. General instructions  Rest.  Drink enough fluids to keep your pee (urine) clear or pale yellow.  Avoid smoking and secondhand smoke. If you smoke and you need help quitting, ask your doctor. Quitting will help your lungs heal faster.  Use an inhaler, cool mist vaporizer, or humidifier as told by your doctor.  Keep all follow-up visits as told by your doctor. This is important. How is this prevented? To lower your risk of getting this condition again:  Wash your hands often with soap and water. If you cannot use soap and water, use hand sanitizer.  Avoid contact with people who have cold symptoms.  Try not to touch your hands to your mouth, nose, or eyes.  Make sure to get the flu shot every year. Contact a doctor if:  Your symptoms do not get better in 2 weeks. Get help right away if:  You cough up blood.  You have chest pain.  You have very bad shortness of breath.  You become dehydrated.  You faint (pass out) or keep feeling like you are going to pass out.  You keep throwing up (vomiting).  You have a very bad headache.  Your fever or chills gets worse. This information is not intended to replace advice given to you by your health care provider. Make sure you discuss any questions you have with your  health care provider. Document Released: 11/19/2007 Document Revised: 01/09/2016 Document Reviewed: 11/21/2015 Elsevier Interactive Patient Education  2017 [employer].

## 2016-05-26 NOTE — Progress Notes (Signed)
Pre visit review using our clinic review tool, if applicable. No additional management support is needed unless otherwise documented below in the visit note. 

## 2016-06-17 NOTE — Progress Notes (Signed)
Patient ID: Morgan Medina, female   DOB: 09/23/1945, 71 y.o.   MRN: [id number]   Subjective:    Patient ID: Morgan Medina, female    DOB: 09-18-1945, 71 y.o.   MRN: [id number]  Chief Complaint  Patient presents with  . Cough  . Nasal Congestion    HPI Patient is in today for evaluation of a 1 week history of head congestion, fevers, chills, myalgias, anorexia. Sore throat, PND and nasal congestion. Symptoms worsening over past 24 hours. Denies CP/palp/SOB/GI or GU c/o. Taking meds as prescribed  Past Medical History:  Diagnosis Date  . Anemia   . Cerebral aneurysm    h/o in 2006  . Dacrocystitis 07/19/2015   right  . Diabetes mellitus type 2 in obese ([place]) 11/05/2014  . GERD (gastroesophageal reflux disease) 04/17/2016  . Hyperlipidemia   . Hypertension   . Osteoarthritis of left hip 12/02/2015  . Osteopenia 11/22/2015  . Overweight     Past Surgical History:  Procedure Laterality Date  . ABDOMINAL HYSTERECTOMY    . CEREBRAL ANEURYSM REPAIR      Family History  Problem Relation Age of Onset  . Cerebral aneurysm Father   . Alzheimer's disease Mother   . Cancer Maternal Aunt     breast  . Diabetes Maternal Grandmother   . Stroke Maternal Grandfather   . Alzheimer's disease Paternal Grandmother   . Stroke Paternal Grandfather     Social History   Social History  . Marital status: Widowed    Spouse name: N/A  . Number of children: N/A  . Years of education: N/A   Occupational History  . Not on file.   Social History Main Topics  . Smoking status: Never Smoker  . Smokeless tobacco: Never Used  . Alcohol use No  . Drug use: No  . Sexual activity: No     Comment: lives with self, no dietary restrictions. works as a Theme park manager, retired from state employment   Other Topics Concern  . Not on file   Social History Narrative  . No narrative on file    Outpatient Medications Prior to Visit  Medication Sig Dispense Refill  . amLODipine (NORVASC) 5 MG tablet TAKE 1  TABLET (5 MG TOTAL) BY MOUTH DAILY. 90 tablet 3  . aspirin 81 MG tablet Takes every other day    . atenolol (TENORMIN) 50 MG tablet TAKE 1 TABLET (50 MG TOTAL) BY MOUTH DAILY. 90 tablet 2  . Blood Glucose Monitoring Suppl ([place]) W/DEVICE KIT Use to check blood sugar. DX. E11.9 1 each 0  . Cyanocobalamin (VITAMIN B 12) 250 MCG LOZG Take by mouth.    . ferrous sulfate 325 (65 FE) MG tablet Take 325 mg by mouth daily with breakfast.    . furosemide (LASIX) 20 MG tablet Take two tablets every morning 180 tablet 2  . glucose blood (ONE TOUCH ULTRA TEST) test strip Use as idirected once daily.  DX E11.9 100 each 6  . glucose blood test strip Test once daily to check blood sugar.  DX E11.9 100 each 11  . losartan (COZAAR) 100 MG tablet TAKE 1 TABLET BY MOUTH EVERY MORNING 90 tablet 1  . Multiple Vitamin (MULTIVITAMIN) capsule Take 1 capsule by mouth daily.    . ONE TOUCH LANCETS MISC Use as directed once daily to check blood sugar. DX: E11.9 100 each 0  . potassium chloride SA (K-DUR,KLOR-CON) 20 MEQ tablet Take 1 tablet (20 mEq total) by mouth daily.  90 tablet 1  . simvastatin (ZOCOR) 20 MG tablet TAKE 1 TABLET BY MOUTH DAILY 90 tablet 1   No facility-administered medications prior to visit.     No Known Allergies  Review of Systems  Constitutional: Positive for fever and malaise/fatigue.  HENT: Positive for congestion and sore throat.   Eyes: Negative for blurred vision.  Respiratory: Positive for cough and sputum production. Negative for shortness of breath.   Cardiovascular: Positive for leg swelling. Negative for chest pain and palpitations.  Gastrointestinal: Negative for abdominal pain, blood in stool and nausea.  Genitourinary: Negative for dysuria and frequency.  Musculoskeletal: Negative for falls.  Skin: Negative for rash.  Neurological: Positive for headaches. Negative for dizziness and loss of consciousness.  Endo/Heme/Allergies: Negative for environmental  allergies.  Psychiatric/Behavioral: Negative for depression. The patient is not nervous/anxious.        Objective:    Physical Exam  Constitutional: She is oriented to person, place, and time. She appears well-developed and well-nourished. No distress.  HENT:  Head: Normocephalic and atraumatic.  Nose: Nose normal.  Eyes: Right eye exhibits no discharge. Left eye exhibits no discharge.  orophrarynx erythematous  Neck: Normal range of motion. Neck supple.  Cardiovascular: Normal rate and regular rhythm.   No murmur heard. Pulmonary/Chest: Effort normal and breath sounds normal.  Abdominal: Soft. Bowel sounds are normal. There is no tenderness.  Musculoskeletal: She exhibits no edema.  Lymphadenopathy:    She has cervical adenopathy.  Neurological: She is alert and oriented to person, place, and time.  Skin: Skin is warm and dry.  Psychiatric: She has a normal mood and affect.  Nursing note and vitals reviewed.   BP 138/78 (BP Location: Right Arm, Patient Position: Sitting, Cuff Size: Large)   Pulse (!) 57   Temp 97.9 F (36.6 C) (Oral)   Wt 201 lb (91.2 kg)   SpO2 97% Comment: RA  BMI 30.56 kg/m  Wt Readings from Last 3 Encounters:  05/26/16 201 lb (91.2 kg)  04/17/16 202 lb 6 oz (91.8 kg)  11/22/15 203 lb 6 oz (92.3 kg)     Lab Results  Component Value Date   WBC 11.3 (H) 04/17/2016   HGB 12.2 04/17/2016   HCT 37.9 04/17/2016   PLT 283.0 04/17/2016   GLUCOSE 81 04/17/2016   CHOL 209 (H) 04/17/2016   TRIG 229.0 (H) 04/17/2016   HDL 57.50 04/17/2016   LDLDIRECT 127.0 04/17/2016   LDLCALC 112 (H) 11/22/2015   ALT 12 04/17/2016   AST 13 04/17/2016   NA 142 04/17/2016   K 4.2 04/17/2016   CL 101 04/17/2016   CREATININE 0.81 04/17/2016   BUN 13 04/17/2016   CO2 32 04/17/2016   TSH 2.17 04/17/2016   HGBA1C 6.9 (H) 04/17/2016    Lab Results  Component Value Date   TSH 2.17 04/17/2016   Lab Results  Component Value Date   WBC 11.3 (H) 04/17/2016   HGB  12.2 04/17/2016   HCT 37.9 04/17/2016   MCV 74.2 (L) 04/17/2016   PLT 283.0 04/17/2016   Lab Results  Component Value Date   NA 142 04/17/2016   K 4.2 04/17/2016   CO2 32 04/17/2016   GLUCOSE 81 04/17/2016   BUN 13 04/17/2016   CREATININE 0.81 04/17/2016   BILITOT 0.2 04/17/2016   ALKPHOS 60 04/17/2016   AST 13 04/17/2016   ALT 12 04/17/2016   PROT 7.7 04/17/2016   ALBUMIN 4.5 04/17/2016   CALCIUM 10.1 04/17/2016   ANIONGAP  8 07/04/2014   GFR 90.26 04/17/2016   Lab Results  Component Value Date   CHOL 209 (H) 04/17/2016   Lab Results  Component Value Date   HDL 57.50 04/17/2016   Lab Results  Component Value Date   LDLCALC 112 (H) 11/22/2015   Lab Results  Component Value Date   TRIG 229.0 (H) 04/17/2016   Lab Results  Component Value Date   CHOLHDL 4 04/17/2016   Lab Results  Component Value Date   HGBA1C 6.9 (H) 04/17/2016       Assessment & Plan:   Problem List Items Addressed This Visit    HTN (hypertension)    Well controlled, no changes to meds. Encouraged heart healthy diet such as the DASH diet and exercise as tolerated.       Diabetes mellitus type 2 in obese (HCC)     minimize simple carbs. Increase exercise as tolerated. Continue current meds      Acute bronchitis    Encouraged increased rest and hydration, add probiotics, zinc such as Coldeze or Xicam. Treat fevers as needed. Started on Amoxicillin and Mucinex       Other Visit Diagnoses    Sore throat    -  Primary   Relevant Orders   POCT rapid strep A (Completed)      I am having Ms. Banton start on amoxicillin and benzonatate. I am also having her maintain her multivitamin, ferrous sulfate, Vitamin B 12, aspirin, ONE TOUCH LANCETS, ONE TOUCH BASIC SYSTEM, glucose blood, glucose blood, amLODipine, atenolol, potassium chloride SA, furosemide, losartan, and simvastatin.  Meds ordered this encounter  Medications  . amoxicillin (AMOXIL) 500 MG capsule    Sig: Take 1 capsule (500  mg total) by mouth 3 (three) times daily.    Dispense:  30 capsule    Refill:  0  . benzonatate (TESSALON) 100 MG capsule    Sig: Take 1 capsule (100 mg total) by mouth 3 (three) times daily as needed for cough.    Dispense:  30 capsule    Refill:  1     Penni Homans, MD

## 2016-06-17 NOTE — Assessment & Plan Note (Signed)
minimize simple carbs. Increase exercise as tolerated. Continue current meds  

## 2016-06-17 NOTE — Assessment & Plan Note (Signed)
Well controlled, no changes to meds. Encouraged heart healthy diet such as the DASH diet and exercise as tolerated.  °

## 2016-06-17 NOTE — Assessment & Plan Note (Signed)
Encouraged increased rest and hydration, add probiotics, zinc such as Coldeze or Xicam. Treat fevers as needed. Started on Amoxicillin and Mucinex

## 2016-09-17 ENCOUNTER — Other Ambulatory Visit: Payer: Self-pay | Admitting: Family Medicine

## 2016-09-17 MED ORDER — POTASSIUM CHLORIDE CRYS ER 20 MEQ PO TBCR
20.0000 meq | EXTENDED_RELEASE_TABLET | Freq: Every day | ORAL | 0 refills | Status: DC
Start: 2016-09-17 — End: 2016-12-13

## 2016-09-17 NOTE — Telephone Encounter (Signed)
Relation to pt: self Call back number:[phone] Pharmacy: [id number] - [place], [place] [phone] (Phone) [phone] (Fax)     Reason for call:  Patient requesting a refill potassium chloride SA (K-DUR,KLOR-CON) 20 MEQ tablet

## 2016-09-17 NOTE — Telephone Encounter (Signed)
Rx sent 

## 2016-10-16 ENCOUNTER — Ambulatory Visit: Payer: Self-pay | Admitting: Family Medicine

## 2016-11-17 ENCOUNTER — Other Ambulatory Visit: Payer: Self-pay | Admitting: Family Medicine

## 2016-12-13 ENCOUNTER — Other Ambulatory Visit: Payer: Self-pay | Admitting: Family Medicine

## 2016-12-19 ENCOUNTER — Other Ambulatory Visit: Payer: Self-pay | Admitting: Family Medicine

## 2016-12-19 ENCOUNTER — Telehealth: Payer: Self-pay | Admitting: Family Medicine

## 2016-12-19 MED ORDER — POTASSIUM CHLORIDE CRYS ER 20 MEQ PO TBCR: 20.0000 meq | EXTENDED_RELEASE_TABLET | Freq: Every day | ORAL | 0 refills | Status: DC

## 2016-12-19 NOTE — Telephone Encounter (Signed)
Self   Refill request for losartan, simvastatin, furosemide, amLODipine, atenolol      Pharmacy: [id number] - [place], [place] - [address] AT [address]OSS FROM [address]

## 2016-12-23 MED ORDER — AMLODIPINE BESYLATE 5 MG PO TABS: ORAL_TABLET | ORAL | 1 refills | Status: DC

## 2016-12-23 MED ORDER — SIMVASTATIN 20 MG PO TABS: 20.0000 mg | ORAL_TABLET | Freq: Every day | ORAL | 1 refills | Status: DC

## 2016-12-23 MED ORDER — ATENOLOL 50 MG PO TABS: ORAL_TABLET | ORAL | 1 refills | Status: DC

## 2016-12-23 MED ORDER — FUROSEMIDE 20 MG PO TABS: ORAL_TABLET | ORAL | 1 refills | Status: DC

## 2016-12-23 NOTE — Telephone Encounter (Signed)
Medications sent tp pharmacy   Please schedule pt for follow up appt.    thx   Pc

## 2017-03-17 ENCOUNTER — Other Ambulatory Visit: Payer: Self-pay | Admitting: Family Medicine

## 2017-03-18 ENCOUNTER — Telehealth: Payer: Self-pay | Admitting: Family Medicine

## 2017-03-18 NOTE — Telephone Encounter (Signed)
Yes at the outside she needs a refill by December I have refilled meds by now

## 2017-03-18 NOTE — Telephone Encounter (Signed)
SB-Last visit 12.11.17/when would you like her to F/U? Req refills/plz advise/thx dmf

## 2017-03-19 NOTE — Telephone Encounter (Signed)
Called pt, made her aware. Pt says that she will call back to schedule apt.

## 2017-03-19 NOTE — Telephone Encounter (Signed)
SJ-Can you please reach out to this pt to sched an appt for December with SB? Plz advise/thx dmf

## 2017-03-31 IMAGING — DX DG CHEST 2V
2 series · 2 of 2 positions shown · non-contrast
Comparison: PA and lateral chest x-ray July 17, 2013

CLINICAL DATA: Nonproductive cough and chest congestion for the
past week

EXAM:
CHEST  2 VIEW

[chest pa]
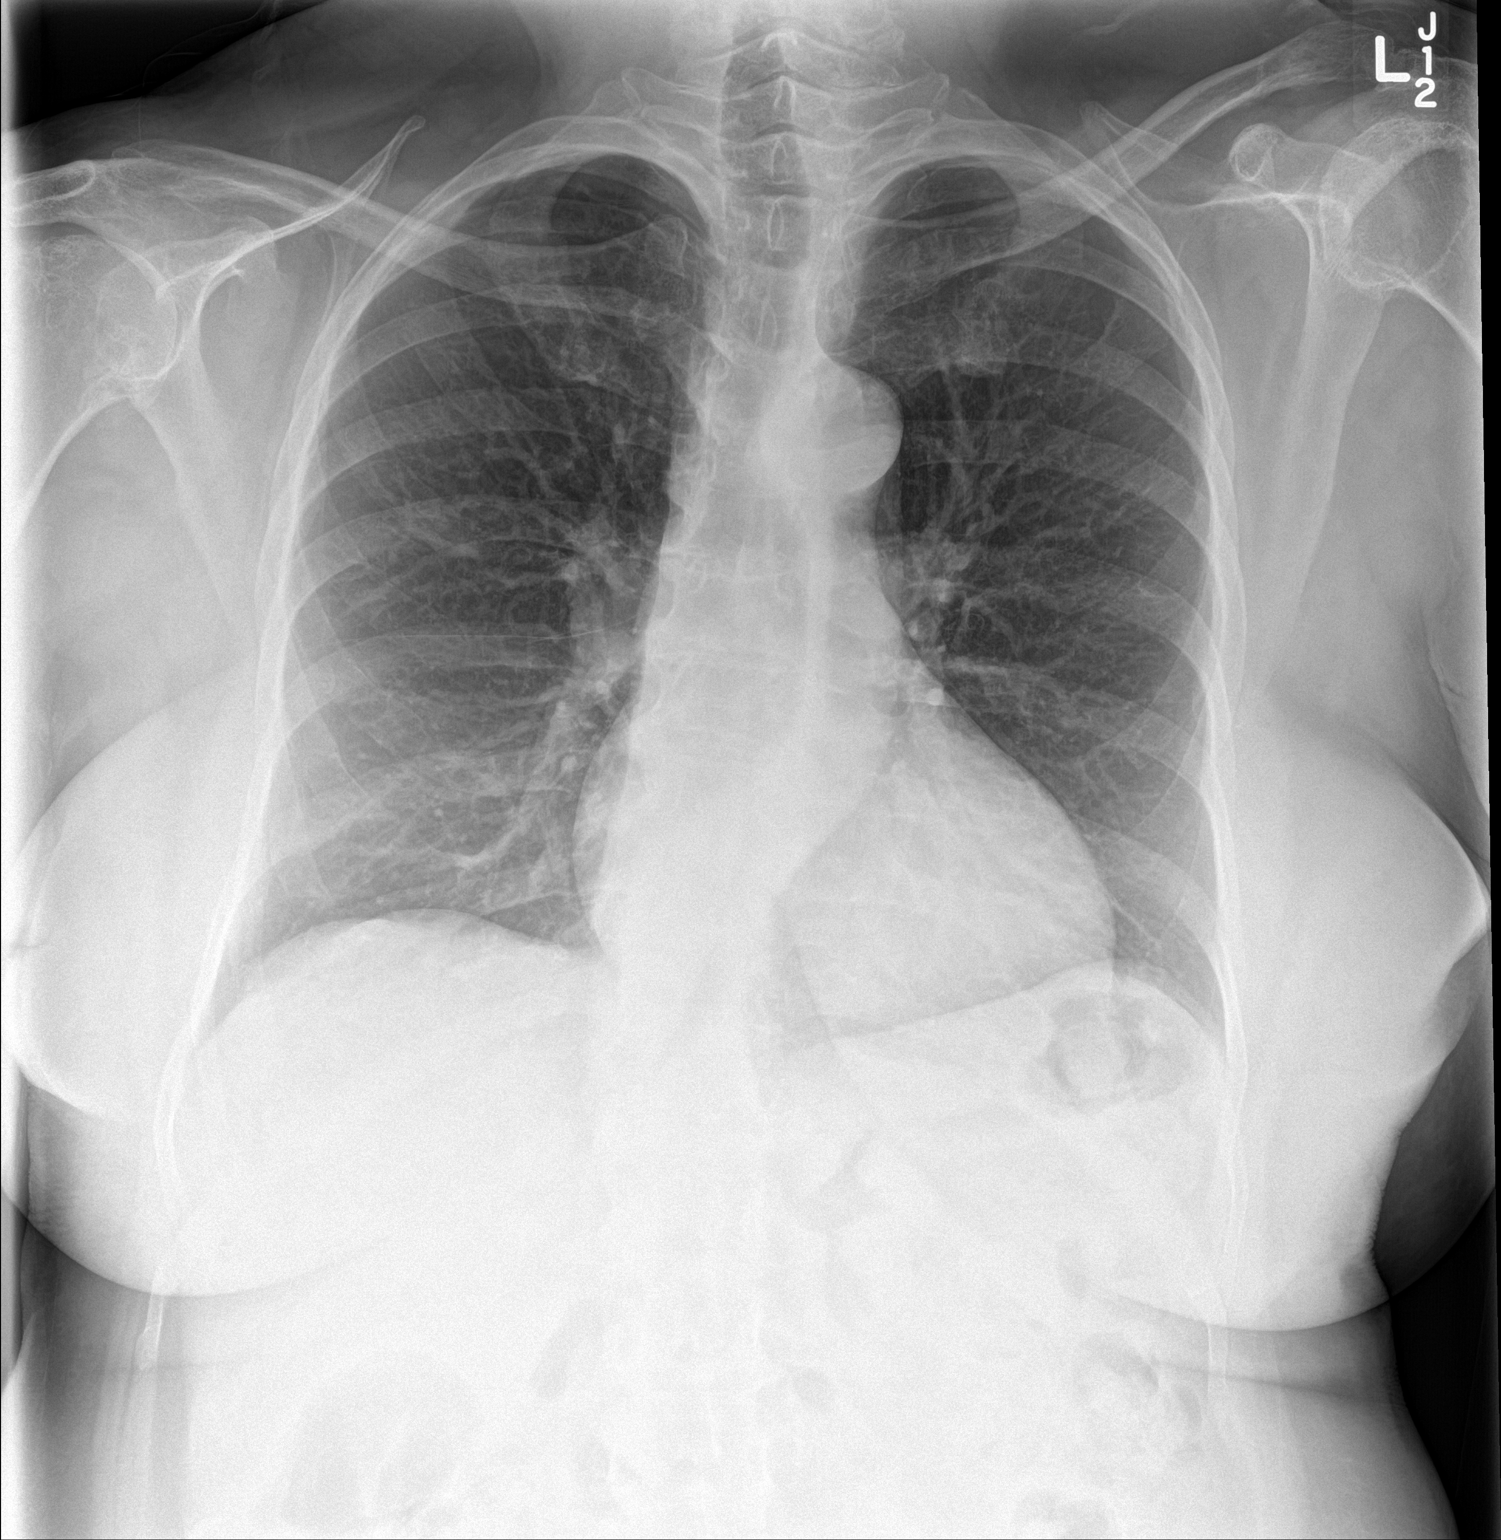

[chest lat]
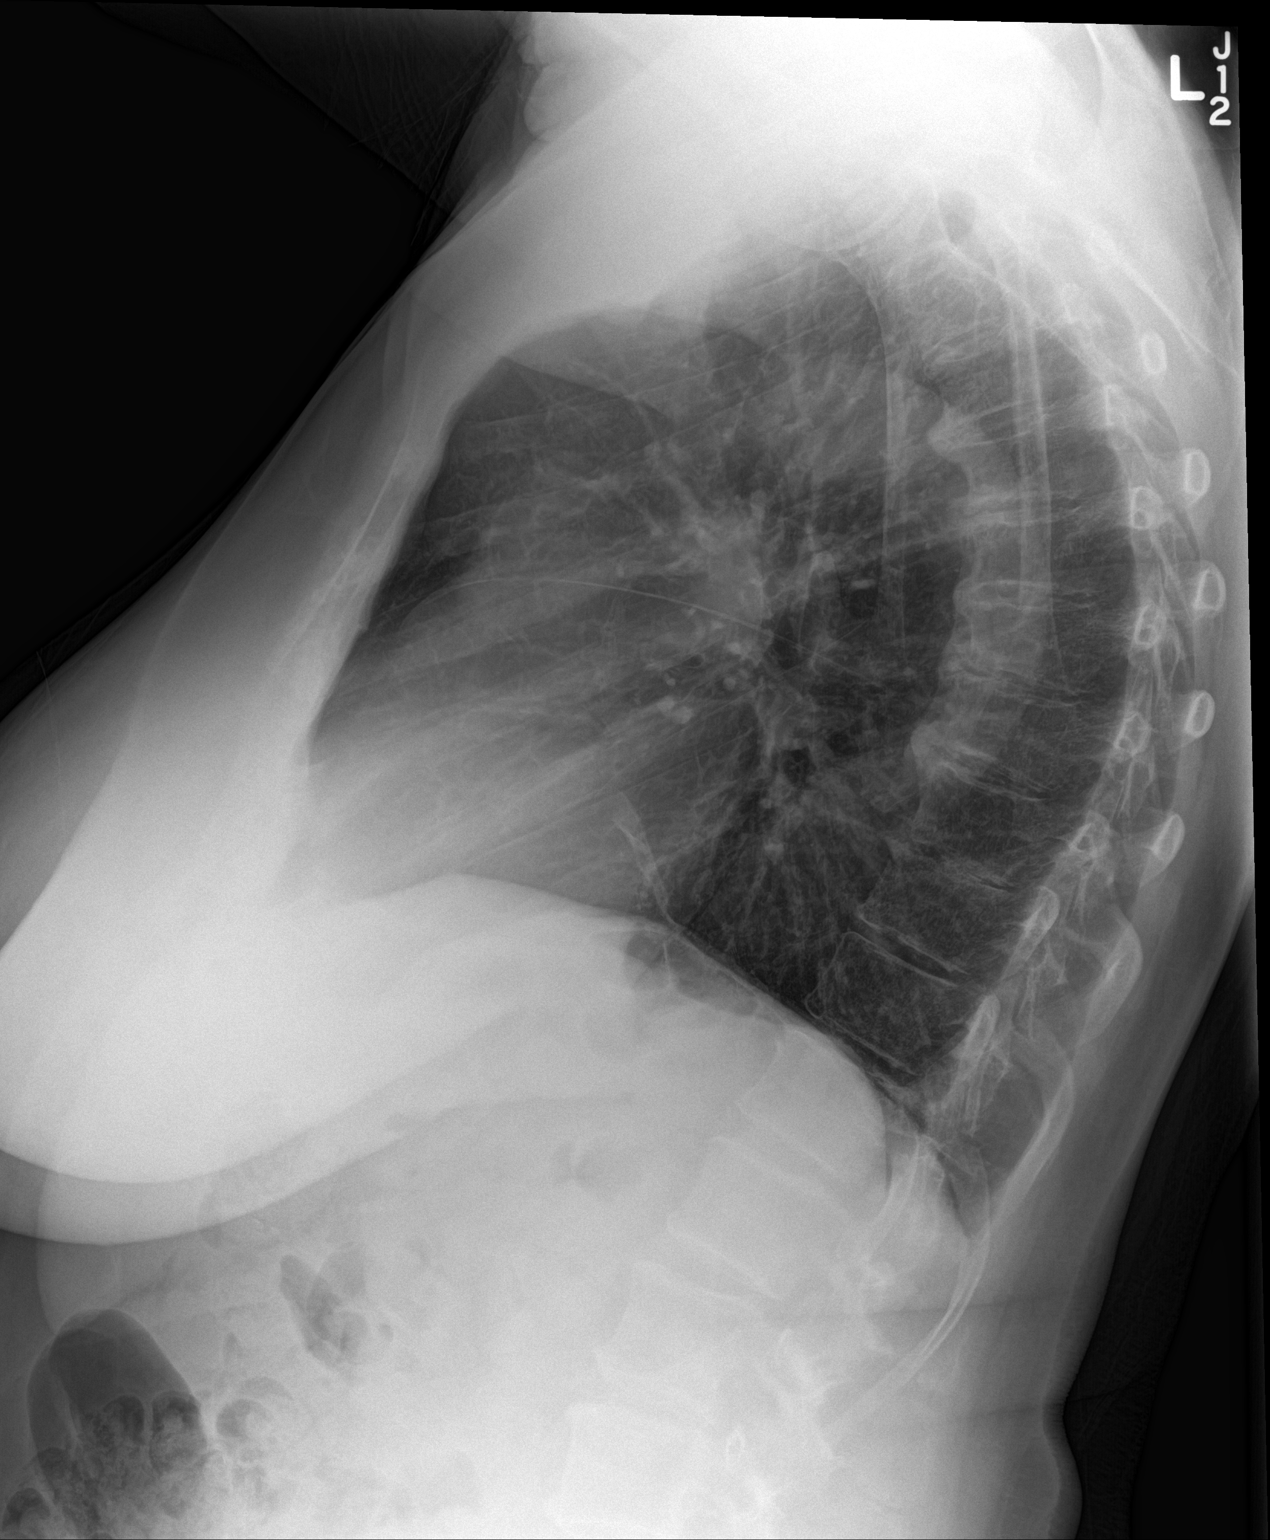

[2 of 2 positions shown; findings below may reference images not displayed]

FINDINGS: The lungs are well-expanded. There is no focal infiltrate. There is
stable scarring in the anterior aspect of the right lower lobe. The
heart and pulmonary vascularity are normal. The mediastinum is
normal in width. There is stable tortuosity of the descending
thoracic aorta. There is no pleural effusion or pneumothorax. There
is mild multilevel degenerative disc disease of the thoracic spine.
IMPRESSION: There is no active cardiopulmonary disease.

## 2017-04-29 ENCOUNTER — Other Ambulatory Visit: Payer: Self-pay

## 2017-04-29 ENCOUNTER — Telehealth: Payer: Self-pay | Admitting: Family Medicine

## 2017-04-29 MED ORDER — LOSARTAN POTASSIUM 100 MG PO TABS: 100.0000 mg | ORAL_TABLET | Freq: Every morning | ORAL | 1 refills | Status: DC

## 2017-04-29 NOTE — Telephone Encounter (Signed)
Rx sent to pharmacy   

## 2017-04-29 NOTE — Telephone Encounter (Signed)
Self.   Refill for Losartan    Pharmacy: [id number] - [place], [place] - [address] AT [address] FROM [address]

## 2017-05-01 ENCOUNTER — Other Ambulatory Visit: Payer: Self-pay

## 2017-05-01 MED ORDER — FUROSEMIDE 20 MG PO TABS: 40.0000 mg | ORAL_TABLET | Freq: Every morning | ORAL | 1 refills | Status: DC

## 2017-05-01 MED ORDER — ATENOLOL 50 MG PO TABS: 50.0000 mg | ORAL_TABLET | Freq: Every day | ORAL | 1 refills | Status: DC

## 2017-05-01 MED ORDER — AMLODIPINE BESYLATE 5 MG PO TABS: 5.0000 mg | ORAL_TABLET | Freq: Every day | ORAL | 1 refills | Status: DC

## 2017-05-06 ENCOUNTER — Other Ambulatory Visit: Payer: Self-pay

## 2017-05-06 MED ORDER — FERROUS SULFATE 325 (65 FE) MG PO TABS: 325.0000 mg | ORAL_TABLET | Freq: Every day | ORAL | 1 refills | Status: DC

## 2017-06-02 ENCOUNTER — Other Ambulatory Visit: Payer: Self-pay | Admitting: Family Medicine

## 2017-06-02 DIAGNOSIS — Z1231 Encounter for screening mammogram for malignant neoplasm of breast: Secondary | ICD-10-CM

## 2017-06-04 ENCOUNTER — Ambulatory Visit (HOSPITAL_BASED_OUTPATIENT_CLINIC_OR_DEPARTMENT_OTHER)
Admission: RE | Admit: 2017-06-04 | Discharge: 2017-06-04 | Disposition: A | Discharge status: Home / Self Care | Payer: Medicare Other | Source: Ambulatory Visit | Attending: Family Medicine | Admitting: Family Medicine

## 2017-06-04 ENCOUNTER — Ambulatory Visit: Payer: Medicare Other | Admitting: Family Medicine

## 2017-06-04 ENCOUNTER — Encounter: Payer: Self-pay | Admitting: Family Medicine

## 2017-06-04 VITALS — BP 158/90 | HR 58 | Temp 98.0°F | Resp 18 | Wt 193.0 lb

## 2017-06-04 DIAGNOSIS — Z23 Encounter for immunization: Secondary | ICD-10-CM | POA: Diagnosis not present

## 2017-06-04 DIAGNOSIS — E669 Obesity, unspecified: Secondary | ICD-10-CM

## 2017-06-04 DIAGNOSIS — E782 Mixed hyperlipidemia: Secondary | ICD-10-CM | POA: Diagnosis not present

## 2017-06-04 DIAGNOSIS — M8580 Other specified disorders of bone density and structure, unspecified site: Secondary | ICD-10-CM

## 2017-06-04 DIAGNOSIS — I10 Essential (primary) hypertension: Secondary | ICD-10-CM | POA: Diagnosis not present

## 2017-06-04 DIAGNOSIS — E1169 Type 2 diabetes mellitus with other specified complication: Secondary | ICD-10-CM

## 2017-06-04 DIAGNOSIS — Z1231 Encounter for screening mammogram for malignant neoplasm of breast: Secondary | ICD-10-CM | POA: Diagnosis present

## 2017-06-04 LAB — CBC
HCT: 38.3 % (ref 36.0–46.0)
Hemoglobin: 11.9 g/dL — ABNORMAL LOW (ref 12.0–15.0)
MCHC: 31.1 g/dL (ref 30.0–36.0)
MCV: 74.8 fl — ABNORMAL LOW (ref 78.0–100.0)
Platelets: 264 10*3/uL (ref 150.0–400.0)
RBC: 5.12 Mil/uL — ABNORMAL HIGH (ref 3.87–5.11)
RDW: 14 % (ref 11.5–15.5)
WBC: 7.7 10*3/uL (ref 4.0–10.5)

## 2017-06-04 LAB — HEMOGLOBIN A1C: Hgb A1c MFr Bld: 6.7 % — ABNORMAL HIGH (ref 4.6–6.5)

## 2017-06-04 LAB — COMPREHENSIVE METABOLIC PANEL
ALT: 10 U/L (ref 0–35)
AST: 14 U/L (ref 0–37)
Albumin: 4.4 g/dL (ref 3.5–5.2)
Alkaline Phosphatase: 58 U/L (ref 39–117)
BUN: 14 mg/dL (ref 6–23)
CO2: 34 mEq/L — ABNORMAL HIGH (ref 19–32)
Calcium: 10 mg/dL (ref 8.4–10.5)
Chloride: 102 mEq/L (ref 96–112)
Creatinine, Ser: 0.75 mg/dL (ref 0.40–1.20)
GFR: 98.32 mL/min (ref >60.00)
Glucose, Bld: 99 mg/dL (ref 70–99)
Potassium: 3.5 mEq/L (ref 3.5–5.1)
Sodium: 143 mEq/L (ref 135–145)
Total Bilirubin: 0.3 mg/dL (ref 0.2–1.2)
Total Protein: 7.6 g/dL (ref 6.0–8.3)

## 2017-06-04 LAB — TSH: TSH: 2.61 u[IU]/mL (ref 0.35–4.50)

## 2017-06-04 LAB — LIPID PANEL
Cholesterol: 159 mg/dL (ref 0–200)
HDL: 54 mg/dL (ref >39.00)
LDL Cholesterol: 88 mg/dL (ref 0–99)
NonHDL: 105.41
Total CHOL/HDL Ratio: 3
Triglycerides: 87 mg/dL (ref 0.0–149.0)
VLDL: 17.4 mg/dL (ref 0.0–40.0)

## 2017-06-04 MED ORDER — POTASSIUM CHLORIDE CRYS ER 20 MEQ PO TBCR: 20.0000 meq | EXTENDED_RELEASE_TABLET | Freq: Every day | ORAL | 3 refills | Status: DC

## 2017-06-04 MED ORDER — AMLODIPINE BESYLATE 5 MG PO TABS: 5.0000 mg | ORAL_TABLET | Freq: Every day | ORAL | 3 refills | Status: DC

## 2017-06-04 MED ORDER — HYDROCHLOROTHIAZIDE 12.5 MG PO CAPS: 12.5000 mg | ORAL_CAPSULE | Freq: Every day | ORAL | 3 refills | Status: DC

## 2017-06-04 MED ORDER — ATENOLOL 50 MG PO TABS: 50.0000 mg | ORAL_TABLET | Freq: Every day | ORAL | 3 refills | Status: DC

## 2017-06-04 NOTE — Assessment & Plan Note (Signed)
hgba1c acceptable, minimize simple carbs. Increase exercise as tolerated. Continue current meds 

## 2017-06-04 NOTE — Assessment & Plan Note (Signed)
Encouraged heart healthy diet, increase exercise, avoid trans fats, consider a krill oil cap daily 

## 2017-06-04 NOTE — Progress Notes (Signed)
Subjective:  I acted as a Education administrator for Dr. Charlett Blake. [place], [place]  Patient ID: Morgan Medina, female    DOB: 01-01-1946, 71 y.o.   MRN: [id number]  No chief complaint on file.   HPI  Patient is in today for a medication follow up and she reports she feels well today. She has been under a great deal of stress secondary to family concerns but she feels she is managing well. She has a 85 year old granddaughter living with her. No recent febrile illness or hospitalizations. Denies CP/palp/SOB/HA/congestion/fevers/GI or GU c/o. Taking meds as prescribed  Patient Care Team: Mosie Lukes, MD as PCP - General (Family Medicine) Rolley Sims, [place] as Consulting Physician (Optometry)   Past Medical History:  Diagnosis Date  . Anemia   . Cerebral aneurysm    h/o in 2006  . Dacrocystitis 07/19/2015   right  . Diabetes mellitus type 2 in obese ([place]) 11/05/2014  . GERD (gastroesophageal reflux disease) 04/17/2016  . Hyperlipidemia   . Hypertension   . Osteoarthritis of left hip 12/02/2015  . Osteopenia 11/22/2015  . Overweight     Past Surgical History:  Procedure Laterality Date  . ABDOMINAL HYSTERECTOMY    . CEREBRAL ANEURYSM REPAIR      Family History  Problem Relation Age of Onset  . Cerebral aneurysm Father   . Alzheimer's disease Mother   . Cancer Maternal Aunt        breast  . Diabetes Maternal Grandmother   . Stroke Maternal Grandfather   . Alzheimer's disease Paternal Grandmother   . Stroke Paternal Grandfather     Social History   Socioeconomic History  . Marital status: Widowed    Spouse name: Not on file  . Number of children: Not on file  . Years of education: Not on file  . Highest education level: Not on file  Social Needs  . Financial resource strain: Not on file  . Food insecurity - worry: Not on file  . Food insecurity - inability: Not on file  . Transportation needs - medical: Not on file  . Transportation needs - non-medical: Not on file  Occupational  History  . Not on file  Tobacco Use  . Smoking status: Never Smoker  . Smokeless tobacco: Never Used  Substance and Sexual Activity  . Alcohol use: No  . Drug use: No  . Sexual activity: No    Comment: lives with self, no dietary restrictions. works as a Theme park manager, retired from state employment  Other Topics Concern  . Not on file  Social History Narrative  . Not on file    Outpatient Medications Prior to Visit  Medication Sig Dispense Refill  . aspirin 81 MG tablet Takes every other day    . benzonatate (TESSALON) 100 MG capsule Take 1 capsule (100 mg total) by mouth 3 (three) times daily as needed for cough. 30 capsule 1  . Blood Glucose Monitoring Suppl ([place]) W/DEVICE KIT Use to check blood sugar. DX. E11.9 1 each 0  . Cyanocobalamin (VITAMIN B 12) 250 MCG LOZG Take by mouth.    . ferrous sulfate 325 (65 FE) MG tablet Take 1 tablet (325 mg total) by mouth daily with breakfast. 90 tablet 1  . furosemide (LASIX) 20 MG tablet Take 2 tablets (40 mg total) every morning by mouth. 60 tablet 1  . glucose blood (ONE TOUCH ULTRA TEST) test strip Use as idirected once daily.  DX E11.9 100 each 6  .  glucose blood test strip Test once daily to check blood sugar.  DX E11.9 100 each 11  . losartan (COZAAR) 100 MG tablet Take 1 tablet (100 mg total) every morning by mouth. 90 tablet 1  . Multiple Vitamin (MULTIVITAMIN) capsule Take 1 capsule by mouth daily.    . ONE TOUCH LANCETS MISC Use as directed once daily to check blood sugar. DX: E11.9 100 each 0  . simvastatin (ZOCOR) 20 MG tablet Take 1 tablet (20 mg total) by mouth daily. 30 tablet 1  . amLODipine (NORVASC) 5 MG tablet Take 1 tablet (5 mg total) daily by mouth. 30 tablet 1  . atenolol (TENORMIN) 50 MG tablet Take 1 tablet (50 mg total) daily by mouth. 30 tablet 1  . KLOR-CON M20 20 MEQ tablet TAKE 1 TABLET (20 MEQ TOTAL) BY MOUTH DAILY. 90 tablet 0  . potassium chloride SA (KLOR-CON M20) 20 MEQ tablet Take 1 tablet (20  mEq total) by mouth daily. 90 tablet 0   No facility-administered medications prior to visit.     No Known Allergies  Review of Systems  Constitutional: Negative for fever and malaise/fatigue.  HENT: Negative for congestion.   Eyes: Negative for blurred vision.  Respiratory: Negative for shortness of breath.   Cardiovascular: Negative for chest pain, palpitations and leg swelling.  Gastrointestinal: Negative for abdominal pain, blood in stool and nausea.  Genitourinary: Negative for dysuria and frequency.  Musculoskeletal: Negative for falls.  Skin: Negative for rash.  Neurological: Negative for dizziness, loss of consciousness and headaches.  Endo/Heme/Allergies: Negative for environmental allergies.  Psychiatric/Behavioral: Negative for depression. The patient is not nervous/anxious.        Objective:    Physical Exam  Constitutional: She is oriented to person, place, and time. She appears well-developed and well-nourished. No distress.  HENT:  Head: Normocephalic and atraumatic.  Nose: Nose normal.  Eyes: Right eye exhibits no discharge. Left eye exhibits no discharge.  Neck: Normal range of motion. Neck supple.  Cardiovascular: Normal rate and regular rhythm.  No murmur heard. Pulmonary/Chest: Effort normal and breath sounds normal.  Abdominal: Soft. Bowel sounds are normal. There is no tenderness.  Musculoskeletal: She exhibits no edema.  Neurological: She is alert and oriented to person, place, and time.  Skin: Skin is warm and dry.  Psychiatric: She has a normal mood and affect.  Nursing note and vitals reviewed.   BP (!) 158/90   Pulse (!) 58   Temp 98 F (36.7 C) (Oral)   Resp 18   Wt 193 lb (87.5 kg)   SpO2 99%   BMI 29.35 kg/m  Wt Readings from Last 3 Encounters:  06/04/17 193 lb (87.5 kg)  05/26/16 201 lb (91.2 kg)  04/17/16 202 lb 6 oz (91.8 kg)   BP Readings from Last 3 Encounters:  06/04/17 (!) 158/90  05/26/16 138/78  04/17/16 130/82      Immunization History  Administered Date(s) Administered  . Influenza, High Dose Seasonal PF 04/17/2016, 06/04/2017  . Influenza,inj,Quad PF,6+ Mos 04/11/2014, 05/23/2015    Health Maintenance  Topic Date Due  . Hepatitis C Screening  Dec 16, 1945  . FOOT EXAM  09/04/1957  . PNA vac Low Risk Adult (1 of 2 - PCV13) 09/04/2012  . OPHTHALMOLOGY EXAM  08/23/2016  . HEMOGLOBIN A1C  12/03/2017  . MAMMOGRAM  06/05/2019  . COLONOSCOPY  06/15/2020  . TETANUS/TDAP  06/15/2020  . INFLUENZA VACCINE  Completed  . DEXA SCAN  Addressed    Lab Results  Component Value Date   WBC 7.7 06/04/2017   HGB 11.9 (L) 06/04/2017   HCT 38.3 06/04/2017   PLT 264.0 06/04/2017   GLUCOSE 99 06/04/2017   CHOL 159 06/04/2017   TRIG 87.0 06/04/2017   HDL 54.00 06/04/2017   LDLDIRECT 127.0 04/17/2016   LDLCALC 88 06/04/2017   ALT 10 06/04/2017   AST 14 06/04/2017   NA 143 06/04/2017   K 3.5 06/04/2017   CL 102 06/04/2017   CREATININE 0.75 06/04/2017   BUN 14 06/04/2017   CO2 34 (H) 06/04/2017   TSH 2.61 06/04/2017   HGBA1C 6.7 (H) 06/04/2017    Lab Results  Component Value Date   TSH 2.61 06/04/2017   Lab Results  Component Value Date   WBC 7.7 06/04/2017   HGB 11.9 (L) 06/04/2017   HCT 38.3 06/04/2017   MCV 74.8 (L) 06/04/2017   PLT 264.0 06/04/2017   Lab Results  Component Value Date   NA 143 06/04/2017   K 3.5 06/04/2017   CO2 34 (H) 06/04/2017   GLUCOSE 99 06/04/2017   BUN 14 06/04/2017   CREATININE 0.75 06/04/2017   BILITOT 0.3 06/04/2017   ALKPHOS 58 06/04/2017   AST 14 06/04/2017   ALT 10 06/04/2017   PROT 7.6 06/04/2017   ALBUMIN 4.4 06/04/2017   CALCIUM 10.0 06/04/2017   ANIONGAP 8 07/04/2014   GFR 98.32 06/04/2017   Lab Results  Component Value Date   CHOL 159 06/04/2017   Lab Results  Component Value Date   HDL 54.00 06/04/2017   Lab Results  Component Value Date   LDLCALC 88 06/04/2017   Lab Results  Component Value Date   TRIG 87.0 06/04/2017    Lab Results  Component Value Date   CHOLHDL 3 06/04/2017   Lab Results  Component Value Date   HGBA1C 6.7 (H) 06/04/2017         Assessment & Plan:   Problem List Items Addressed This Visit    HTN (hypertension)    Poorly controlled will alter medications, encouraged DASH diet, minimize caffeine and obtain adequate sleep. Report concerning symptoms and follow up as directed and as needed. Will add Amlodipine 5 mg daily      Relevant Medications   atenolol (TENORMIN) 50 MG tablet   amLODipine (NORVASC) 5 MG tablet   hydrochlorothiazide (MICROZIDE) 12.5 MG capsule   Other Relevant Orders   CBC (Completed)   Comprehensive metabolic panel (Completed)   TSH (Completed)   Hyperlipidemia    Encouraged heart healthy diet, increase exercise, avoid trans fats, consider a krill oil cap daily      Relevant Medications   atenolol (TENORMIN) 50 MG tablet   amLODipine (NORVASC) 5 MG tablet   hydrochlorothiazide (MICROZIDE) 12.5 MG capsule   Other Relevant Orders   Lipid panel (Completed)   Diabetes mellitus type 2 in obese (HCC)    hgba1c acceptable, minimize simple carbs. Increase exercise as tolerated. Continue current meds      Relevant Orders   Hemoglobin A1c (Completed)   Osteopenia    Encouraged to get adequate exercise, calcium and vitamin d intake       Other Visit Diagnoses    Needs flu shot    -  Primary   Relevant Orders   Flu vaccine HIGH DOSE PF (Fluzone High dose) (Completed)      I have discontinued Nekisha Meter's KLOR-CON M20. I have also changed her atenolol and amLODipine. Additionally, I am having her start on hydrochlorothiazide. Lastly,  I am having her maintain her multivitamin, Vitamin B 12, aspirin, ONE TOUCH LANCETS, ONE TOUCH BASIC SYSTEM, glucose blood, glucose blood, benzonatate, simvastatin, losartan, furosemide, ferrous sulfate, and potassium chloride SA.  Meds ordered this encounter  Medications  . atenolol (TENORMIN) 50 MG tablet     Sig: Take 1 tablet (50 mg total) by mouth daily.    Dispense:  30 tablet    Refill:  3  . amLODipine (NORVASC) 5 MG tablet    Sig: Take 1 tablet (5 mg total) by mouth daily.    Dispense:  30 tablet    Refill:  3  . potassium chloride SA (KLOR-CON M20) 20 MEQ tablet    Sig: Take 1 tablet (20 mEq total) by mouth daily.    Dispense:  30 tablet    Refill:  3  . hydrochlorothiazide (MICROZIDE) 12.5 MG capsule    Sig: Take 1 capsule (12.5 mg total) by mouth daily.    Dispense:  30 capsule    Refill:  3    CMA served as scribe during this visit. History, Physical and Plan performed by medical provider. Documentation and orders reviewed and attested to.  Penni Homans, MD

## 2017-06-04 NOTE — Patient Instructions (Signed)

## 2017-06-04 NOTE — Assessment & Plan Note (Signed)
Encouraged to get adequate exercise, calcium and vitamin d intake 

## 2017-06-12 NOTE — Assessment & Plan Note (Signed)
Poorly controlled will alter medications, encouraged DASH diet, minimize caffeine and obtain adequate sleep. Report concerning symptoms and follow up as directed and as needed. Will add Amlodipine 5 mg daily

## 2017-06-18 ENCOUNTER — Ambulatory Visit: Payer: Self-pay

## 2017-07-01 ENCOUNTER — Ambulatory Visit: Payer: Self-pay

## 2017-07-04 ENCOUNTER — Other Ambulatory Visit: Payer: Self-pay | Admitting: Family Medicine

## 2017-09-03 ENCOUNTER — Ambulatory Visit: Payer: Medicare Other | Admitting: Family Medicine

## 2017-09-03 ENCOUNTER — Encounter: Payer: Self-pay | Admitting: Family Medicine

## 2017-09-03 DIAGNOSIS — M8580 Other specified disorders of bone density and structure, unspecified site: Secondary | ICD-10-CM | POA: Diagnosis not present

## 2017-09-03 DIAGNOSIS — E1169 Type 2 diabetes mellitus with other specified complication: Secondary | ICD-10-CM

## 2017-09-03 DIAGNOSIS — E669 Obesity, unspecified: Secondary | ICD-10-CM | POA: Diagnosis not present

## 2017-09-03 DIAGNOSIS — I10 Essential (primary) hypertension: Secondary | ICD-10-CM

## 2017-09-03 DIAGNOSIS — E663 Overweight: Secondary | ICD-10-CM | POA: Diagnosis not present

## 2017-09-03 DIAGNOSIS — E782 Mixed hyperlipidemia: Secondary | ICD-10-CM

## 2017-09-03 LAB — CBC
HCT: 37.8 % (ref 36.0–46.0)
Hemoglobin: 12.1 g/dL (ref 12.0–15.0)
MCHC: 32 g/dL (ref 30.0–36.0)
MCV: 74.4 fl — ABNORMAL LOW (ref 78.0–100.0)
Platelets: 302 10*3/uL (ref 150.0–400.0)
RBC: 5.07 Mil/uL (ref 3.87–5.11)
RDW: 13.7 % (ref 11.5–15.5)
WBC: 8.6 10*3/uL (ref 4.0–10.5)

## 2017-09-03 LAB — HEMOGLOBIN A1C: Hgb A1c MFr Bld: 6.7 % — ABNORMAL HIGH (ref 4.6–6.5)

## 2017-09-03 LAB — TSH: TSH: 2.27 u[IU]/mL (ref 0.35–4.50)

## 2017-09-03 MED ORDER — HYDROCHLOROTHIAZIDE 25 MG PO TABS: 25.0000 mg | ORAL_TABLET | Freq: Every day | ORAL | 3 refills | Status: DC

## 2017-09-03 NOTE — Assessment & Plan Note (Signed)
Need adequate exercise, calcium and vitamin d intake

## 2017-09-03 NOTE — Patient Instructions (Addendum)
Tetanus if injured especially Prevnar is the first pneumonia shot then a year later we give Pneumovax  Shingrix is the new shingles shot 2 shots over 2-6 months, at pharmacy  Potassium Content of Foods Potassium is a mineral found in many foods and drinks. It helps keep fluids and minerals balanced in your body and affects how steadily your heart beats. Potassium also helps control your blood pressure and keep your muscles and nervous system healthy. Certain health conditions and medicines may change the balance of potassium in your body. When this happens, you can help balance your level of potassium through the foods that you do or do not eat. Your health care provider or dietitian may recommend an amount of potassium that you should have each day. The following lists of foods provide the amount of potassium (in parentheses) per serving in each item. High in potassium The following foods and beverages have 200 mg or more of potassium per serving:  Apricots, 2 raw or 5 dry (200 mg).  Artichoke, 1 medium (345 mg).  Avocado, raw,  each (245 mg).  Banana, 1 medium (425 mg).  Beans, lima, or baked beans, canned,  cup (280 mg).  Beans, white, canned,  cup (595 mg).  Beef roast, 3 oz (320 mg).  Beef, ground, 3 oz (270 mg).  Beets, raw or cooked,  cup (260 mg).  Bran muffin, 2 oz (300 mg).  Broccoli,  cup (230 mg).  Brussels sprouts,  cup (250 mg).  Cantaloupe,  cup (215 mg).  Cereal, 100% bran,  cup (200-400 mg).  Cheeseburger, single, fast food, 1 each (225-400 mg).  Chicken, 3 oz (220 mg).  Clams, canned, 3 oz (535 mg).  Crab, 3 oz (225 mg).  Dates, 5 each (270 mg).  Dried beans and peas,  cup (300-475 mg).  Figs, dried, 2 each (260 mg).  Fish: halibut, tuna, cod, snapper, 3 oz (480 mg).  Fish: salmon, haddock, swordfish, perch, 3 oz (300 mg).  Fish, tuna, canned 3 oz (200 mg).  [place] fries, fast food, 3 oz (470 mg).  Granola with fruit and nuts,   cup (200 mg).  Grapefruit juice,  cup (200 mg).  Greens, beet,  cup (655 mg).  Honeydew melon,  cup (200 mg).  Kale, raw, 1 cup (300 mg).  Kiwi, 1 medium (240 mg).  Kohlrabi, rutabaga, parsnips,  cup (280 mg).  Lentils,  cup (365 mg).  Mango, 1 each (325 mg).  Milk, chocolate, 1 cup (420 mg).  Milk: nonfat, low-fat, whole, buttermilk, 1 cup (350-380 mg).  Molasses, 1 Tbsp (295 mg).  Mushrooms,  cup (280) mg.  Nectarine, 1 each (275 mg).  Nuts: almonds, peanuts, hazelnuts, [place], cashew, mixed, 1 oz (200 mg).  Nuts, pistachios, 1 oz (295 mg).  Orange, 1 each (240 mg).  Orange juice,  cup (235 mg).  Papaya, medium,  fruit (390 mg).  Peanut butter, chunky, 2 Tbsp (240 mg).  Peanut butter, smooth, 2 Tbsp (210 mg).  Pear, 1 medium (200 mg).  Pomegranate, 1 whole (400 mg).  Pomegranate juice,  cup (215 mg).  Pork, 3 oz (350 mg).  Potato chips, salted, 1 oz (465 mg).  Potato, baked with skin, 1 medium (925 mg).  Potatoes, boiled,  cup (255 mg).  Potatoes, mashed,  cup (330 mg).  Prune juice,  cup (370 mg).  Prunes, 5 each (305 mg).  Pudding, chocolate,  cup (230 mg).  Pumpkin, canned,  cup (250 mg).  Raisins, seedless,  cup (270  mg).  Seeds, sunflower or pumpkin, 1 oz (240 mg).  Soy milk, 1 cup (300 mg).  Spinach,  cup (420 mg).  Spinach, canned,  cup (370 mg).  Sweet potato, baked with skin, 1 medium (450 mg).  Swiss chard,  cup (480 mg).  Tomato or vegetable juice,  cup (275 mg).  Tomato sauce or puree,  cup (400-550 mg).  Tomato, raw, 1 medium (290 mg).  Tomatoes, canned,  cup (200-300 mg).  [place], 3 oz (250 mg).  Wheat germ, 1 oz (250 mg).  Winter squash,  cup (250 mg).  Yogurt, plain or fruited, 6 oz (260-435 mg).  Zucchini,  cup (220 mg).  Moderate in potassium The following foods and beverages have 50-200 mg of potassium per serving:  Apple, 1 each (150 mg).  Apple juice,  cup (150  mg).  Applesauce,  cup (90 mg).  Apricot nectar,  cup (140 mg).  Asparagus, small spears,  cup or 6 spears (155 mg).  Bagel, cinnamon raisin, 1 each (130 mg).  Bagel, egg or plain, 4 in., 1 each (70 mg).  Beans, green,  cup (90 mg).  Beans, yellow,  cup (190 mg).  Beer, regular, 12 oz (100 mg).  Beets, canned,  cup (125 mg).  Blackberries,  cup (115 mg).  Blueberries,  cup (60 mg).  Bread, whole wheat, 1 slice (70 mg).  Broccoli, raw,  cup (145 mg).  Cabbage,  cup (150 mg).  Carrots, cooked or raw,  cup (180 mg).  Cauliflower, raw,  cup (150 mg).  Celery, raw,  cup (155 mg).  Cereal, bran flakes, cup (120-150 mg).  Cheese, cottage,  cup (110 mg).  Cherries, 10 each (150 mg).  Chocolate, 1 oz bar (165 mg).  Coffee, brewed 6 oz (90 mg).  Corn,  cup or 1 ear (195 mg).  Cucumbers,  cup (80 mg).  Egg, large, 1 each (60 mg).  Eggplant,  cup (60 mg).  Endive, raw, cup (80 mg).  English muffin, 1 each (65 mg).  Fish, orange roughy, 3 oz (150 mg).  Frankfurter, beef or pork, 1 each (75 mg).  Fruit cocktail,  cup (115 mg).  Grape juice,  cup (170 mg).  Grapefruit,  fruit (175 mg).  Grapes,  cup (155 mg).  Greens: kale, turnip, collard,  cup (110-150 mg).  Ice cream or frozen yogurt, chocolate,  cup (175 mg).  Ice cream or frozen yogurt, vanilla,  cup (120-150 mg).  Lemons, limes, 1 each (80 mg).  Lettuce, all types, 1 cup (100 mg).  Mixed vegetables,  cup (150 mg).  Mushrooms, raw,  cup (110 mg).  Nuts: walnuts, pecans, or macadamia, 1 oz (125 mg).  Oatmeal,  cup (80 mg).  Okra,  cup (110 mg).  Onions, raw,  cup (120 mg).  Peach, 1 each (185 mg).  Peaches, canned,  cup (120 mg).  Pears, canned,  cup (120 mg).  Peas, green, frozen,  cup (90 mg).  Peppers, green,  cup (130 mg).  Peppers, red,  cup (160 mg).  Pineapple juice,  cup (165 mg).  Pineapple, fresh or canned,  cup (100  mg).  Plums, 1 each (105 mg).  Pudding, vanilla,  cup (150 mg).  Raspberries,  cup (90 mg).  Rhubarb,  cup (115 mg).  Rice, wild,  cup (80 mg).  Shrimp, 3 oz (155 mg).  Spinach, raw, 1 cup (170 mg).  Strawberries,  cup (125 mg).  Summer squash  cup (175-200 mg).  Swiss chard, raw, 1  cup (135 mg).  Tangerines, 1 each (140 mg).  Tea, brewed, 6 oz (65 mg).  Turnips,  cup (140 mg).  Watermelon,  cup (85 mg).  Wine, red, table, 5 oz (180 mg).  Wine, white, table, 5 oz (100 mg).  Low in potassium The following foods and beverages have less than 50 mg of potassium per serving.  Bread, white, 1 slice (30 mg).  Carbonated beverages, 12 oz (less than 5 mg).  Cheese, 1 oz (20-30 mg).  Cranberries,  cup (45 mg).  Cranberry juice cocktail,  cup (20 mg).  Fats and oils, 1 Tbsp (less than 5 mg).  Hummus, 1 Tbsp (32 mg).  Nectar: papaya, mango, or pear,  cup (35 mg).  Rice, white or brown,  cup (50 mg).  Spaghetti or macaroni,  cup cooked (30 mg).  Tortilla, flour or corn, 1 each (50 mg).  Waffle, 4 in., 1 each (50 mg).  Water chestnuts,  cup (40 mg).  This information is not intended to replace advice given to you by your health care provider. Make sure you discuss any questions you have with your health care provider. Document Released: 01/14/2005 Document Revised: 11/08/2015 Document Reviewed: 04/29/2013 Elsevier Interactive Patient Education  2018 [place] or MIND DASH stands for "Dietary Approaches to Stop Hypertension." The DASH eating plan is a healthy eating plan that has been shown to reduce high blood pressure (hypertension). It may also reduce your risk for type 2 diabetes, heart disease, and stroke. The DASH eating plan may also help with weight loss. What are tips for following this plan? General guidelines  Avoid eating more than 2,300 mg (milligrams) of salt (sodium) a day. If you have hypertension, you may  need to reduce your sodium intake to 1,500 mg a day.  Limit alcohol intake to no more than 1 drink a day for nonpregnant women and 2 drinks a day for men. One drink equals 12 oz of beer, 5 oz of wine, or 1 oz of hard liquor.  Work with your health care provider to maintain a healthy body weight or to lose weight. Ask what an ideal weight is for you.  Get at least 30 minutes of exercise that causes your heart to beat faster (aerobic exercise) most days of the week. Activities may include walking, swimming, or biking.  Work with your health care provider or diet and nutrition specialist (dietitian) to adjust your eating plan to your individual calorie needs. Reading food labels  Check food labels for the amount of sodium per serving. Choose foods with less than 5 percent of the Daily Value of sodium. Generally, foods with less than 300 mg of sodium per serving fit into this eating plan.  To find whole grains, look for the word "whole" as the first word in the ingredient list. Shopping  Buy products labeled as "low-sodium" or "no salt added."  Buy fresh foods. Avoid canned foods and premade or frozen meals. Cooking  Avoid adding salt when cooking. Use salt-free seasonings or herbs instead of table salt or sea salt. Check with your health care provider or pharmacist before using salt substitutes.  Do not fry foods. Cook foods using healthy methods such as baking, boiling, grilling, and broiling instead.  Cook with heart-healthy oils, such as olive, canola, soybean, or sunflower oil. Meal planning   Eat a balanced diet that includes: ? 5 or more servings of fruits and vegetables each day. At each meal, try to fill half  of your plate with fruits and vegetables. ? Up to 6-8 servings of whole grains each day. ? Less than 6 oz of lean meat, poultry, or fish each day. A 3-oz serving of meat is about the same size as a deck of cards. One egg equals 1 oz. ? 2 servings of low-fat dairy each  day. ? A serving of nuts, seeds, or beans 5 times each week. ? Heart-healthy fats. Healthy fats called Omega-3 fatty acids are found in foods such as flaxseeds and coldwater fish, like sardines, salmon, and mackerel.  Limit how much you eat of the following: ? Canned or prepackaged foods. ? Food that is high in trans fat, such as fried foods. ? Food that is high in saturated fat, such as fatty meat. ? Sweets, desserts, sugary drinks, and other foods with added sugar. ? Full-fat dairy products.  Do not salt foods before eating.  Try to eat at least 2 vegetarian meals each week.  Eat more home-cooked food and less restaurant, buffet, and fast food.  When eating at a restaurant, ask that your food be prepared with less salt or no salt, if possible. What foods are recommended? The items listed may not be a complete list. Talk with your dietitian about what dietary choices are best for you. Grains Whole-grain or whole-wheat bread. Whole-grain or whole-wheat pasta. Brown rice. [place]. Bulgur. Whole-grain and low-sodium cereals. Pita bread. Low-fat, low-sodium crackers. Whole-wheat flour tortillas. Vegetables Fresh or frozen vegetables (raw, steamed, roasted, or grilled). Low-sodium or reduced-sodium tomato and vegetable juice. Low-sodium or reduced-sodium tomato sauce and tomato paste. Low-sodium or reduced-sodium canned vegetables. Fruits All fresh, dried, or frozen fruit. Canned fruit in natural juice (without added sugar). Meat and other protein foods Skinless chicken or [place]. Ground chicken or [place]. Pork with fat trimmed off. Fish and seafood. Egg whites. Dried beans, peas, or lentils. Unsalted nuts, nut butters, and seeds. Unsalted canned beans. Lean cuts of beef with fat trimmed off. Low-sodium, lean deli meat. Dairy Low-fat (1%) or fat-free (skim) milk. Fat-free, low-fat, or reduced-fat cheeses. Nonfat, low-sodium ricotta or cottage cheese. Low-fat or nonfat yogurt.  Low-fat, low-sodium cheese. Fats and oils Soft margarine without trans fats. Vegetable oil. Low-fat, reduced-fat, or light mayonnaise and salad dressings (reduced-sodium). Canola, safflower, olive, soybean, and sunflower oils. Avocado. Seasoning and other foods Herbs. Spices. Seasoning mixes without salt. Unsalted popcorn and pretzels. Fat-free sweets. What foods are not recommended? The items listed may not be a complete list. Talk with your dietitian about what dietary choices are best for you. Grains Baked goods made with fat, such as croissants, muffins, or some breads. Dry pasta or rice meal packs. Vegetables Creamed or fried vegetables. Vegetables in a cheese sauce. Regular canned vegetables (not low-sodium or reduced-sodium). Regular canned tomato sauce and paste (not low-sodium or reduced-sodium). Regular tomato and vegetable juice (not low-sodium or reduced-sodium). Angie Fava. Olives. Fruits Canned fruit in a light or heavy syrup. Fried fruit. Fruit in cream or butter sauce. Meat and other protein foods Fatty cuts of meat. Ribs. Fried meat. Berniece Salines. Sausage. Bologna and other processed lunch meats. Salami. Fatback. Hotdogs. Bratwurst. Salted nuts and seeds. Canned beans with added salt. Canned or smoked fish. Whole eggs or egg yolks. Chicken or [place] with skin. Dairy Whole or 2% milk, cream, and half-and-half. Whole or full-fat cream cheese. Whole-fat or sweetened yogurt. Full-fat cheese. Nondairy creamers. Whipped toppings. Processed cheese and cheese spreads. Fats and oils Butter. Stick margarine. Lard. Shortening. Ghee. Bacon fat. Tropical oils,  such as coconut, palm kernel, or palm oil. Seasoning and other foods Salted popcorn and pretzels. Onion salt, garlic salt, seasoned salt, table salt, and sea salt. Worcestershire sauce. Tartar sauce. Barbecue sauce. Teriyaki sauce. Soy sauce, including reduced-sodium. Steak sauce. Canned and packaged gravies. Fish sauce. Oyster sauce. Cocktail  sauce. Horseradish that you find on the shelf. Ketchup. Mustard. Meat flavorings and tenderizers. Bouillon cubes. Hot sauce and Tabasco sauce. Premade or packaged marinades. Premade or packaged taco seasonings. Relishes. Regular salad dressings. Where to find more information:  National Heart, Lung, and [place]: https://wilson-eaton.com/  American Heart Association: www.heart.org Summary  The DASH eating plan is a healthy eating plan that has been shown to reduce high blood pressure (hypertension). It may also reduce your risk for type 2 diabetes, heart disease, and stroke.  With the DASH eating plan, you should limit salt (sodium) intake to 2,300 mg a day. If you have hypertension, you may need to reduce your sodium intake to 1,500 mg a day.  When on the DASH eating plan, aim to eat more fresh fruits and vegetables, whole grains, lean proteins, low-fat dairy, and heart-healthy fats.  Work with your health care provider or diet and nutrition specialist (dietitian) to adjust your eating plan to your individual calorie needs. This information is not intended to replace advice given to you by your health care provider. Make sure you discuss any questions you have with your health care provider. Document Released: 05/22/2011 Document Revised: 05/26/2016 Document Reviewed: 05/26/2016 Elsevier Interactive Patient Education  [employer].

## 2017-09-03 NOTE — Assessment & Plan Note (Signed)
[id number] acceptable, minimize simple carbs. Increase exercise as tolerated.  

## 2017-09-03 NOTE — Assessment & Plan Note (Signed)
Well controlled, no changes to meds. Encouraged heart healthy diet such as the DASH diet and exercise as tolerated.  °

## 2017-09-03 NOTE — Progress Notes (Signed)
Subjective:    Patient ID: Morgan Medina, female    DOB: [birth year], 72 y.o.   MRN: [id number]  Chief Complaint  Patient presents with  . Hypertension    Here for follow up  . Diabetes    Here for follow up    HPI Patient is in today for follow up . She feels well today, no recent febrile illness or hospitalizatins. No acute concerns. She notes her systolic blood pressures at home are in the 140s to 711A and diastolics in the 57X to 03Y she feels well. Continues to struggle with dry eyes and sees opthamology every 6 months for that. No other acute concenrs. Denies CP/palp/SOB/HA/congestion/fevers/GI or GU c/o. Taking meds as prescribed  Past Medical History:  Diagnosis Date  . Anemia   . Cerebral aneurysm    h/o in 2006  . Dacrocystitis 07/19/2015   right  . Diabetes mellitus type 2 in obese ([place]) 11/05/2014  . GERD (gastroesophageal reflux disease) 04/17/2016  . Hyperlipidemia   . Hypertension   . Osteoarthritis of left hip 12/02/2015  . Osteopenia 11/22/2015  . Overweight     Past Surgical History:  Procedure Laterality Date  . ABDOMINAL HYSTERECTOMY    . CEREBRAL ANEURYSM REPAIR      Family History  Problem Relation Age of Onset  . Cerebral aneurysm Father   . Alzheimer's disease Mother   . Cancer Maternal Aunt        breast  . Diabetes Maternal Grandmother   . Stroke Maternal Grandfather   . Alzheimer's disease Paternal Grandmother   . Stroke Paternal Grandfather     Social History   Socioeconomic History  . Marital status: Widowed    Spouse name: Not on file  . Number of children: Not on file  . Years of education: Not on file  . Highest education level: Not on file  Occupational History  . Not on file  Social Needs  . Financial resource strain: Not on file  . Food insecurity:    Worry: Not on file    Inability: Not on file  . Transportation needs:    Medical: Not on file    Non-medical: Not on file  Tobacco Use  . Smoking status: Never Smoker  .  Smokeless tobacco: Never Used  Substance and Sexual Activity  . Alcohol use: No  . Drug use: No  . Sexual activity: Never    Comment: lives with self, no dietary restrictions. works as a Theme park manager, retired from state employment  Lifestyle  . Physical activity:    Days per week: Not on file    Minutes per session: Not on file  . Stress: Not on file  Relationships  . Social connections:    Talks on phone: Not on file    Gets together: Not on file    Attends religious service: Not on file    Active member of club or organization: Not on file    Attends meetings of clubs or organizations: Not on file    Relationship status: Not on file  . Intimate partner violence:    Fear of current or ex partner: Not on file    Emotionally abused: Not on file    Physically abused: Not on file    Forced sexual activity: Not on file  Other Topics Concern  . Not on file  Social History Narrative  . Not on file    Outpatient Medications Prior to Visit  Medication Sig Dispense Refill  .  amLODipine (NORVASC) 5 MG tablet Take 1 tablet (5 mg total) by mouth daily. 30 tablet 3  . aspirin 81 MG tablet Takes every other day    . atenolol (TENORMIN) 50 MG tablet Take 1 tablet (50 mg total) by mouth daily. 30 tablet 3  . benzonatate (TESSALON) 100 MG capsule Take 1 capsule (100 mg total) by mouth 3 (three) times daily as needed for cough. 30 capsule 1  . Blood Glucose Monitoring Suppl ([place]) W/DEVICE KIT Use to check blood sugar. DX. E11.9 1 each 0  . Cyanocobalamin (VITAMIN B 12) 250 MCG LOZG Take by mouth.    . ferrous sulfate 325 (65 FE) MG tablet Take 1 tablet (325 mg total) by mouth daily with breakfast. 90 tablet 1  . furosemide (LASIX) 20 MG tablet Take 2 tablets (40 mg total) every morning by mouth. 60 tablet 1  . glucose blood (ONE TOUCH ULTRA TEST) test strip Use as idirected once daily.  DX E11.9 100 each 6  . glucose blood test strip Test once daily to check blood sugar.  DX E11.9  100 each 11  . losartan (COZAAR) 100 MG tablet Take 1 tablet (100 mg total) every morning by mouth. 90 tablet 1  . Multiple Vitamin (MULTIVITAMIN) capsule Take 1 capsule by mouth daily.    . ONE TOUCH LANCETS MISC Use as directed once daily to check blood sugar. DX: E11.9 100 each 0  . potassium chloride SA (KLOR-CON M20) 20 MEQ tablet Take 1 tablet (20 mEq total) by mouth daily. 30 tablet 3  . simvastatin (ZOCOR) 20 MG tablet Take 1 tablet (20 mg total) by mouth daily. 30 tablet 1  . simvastatin (ZOCOR) 20 MG tablet TAKE 1 TABLET BY MOUTH DAILY 90 tablet 1  . hydrochlorothiazide (MICROZIDE) 12.5 MG capsule Take 1 capsule (12.5 mg total) by mouth daily. 30 capsule 3   No facility-administered medications prior to visit.     No Known Allergies  Review of Systems  Constitutional: Negative for fever and malaise/fatigue.  HENT: Negative for congestion.   Eyes: Negative for blurred vision.  Respiratory: Negative for shortness of breath.   Cardiovascular: Negative for chest pain, palpitations and leg swelling.  Gastrointestinal: Negative for abdominal pain, blood in stool and nausea.  Genitourinary: Negative for dysuria and frequency.  Musculoskeletal: Negative for falls.  Skin: Negative for rash.  Neurological: Negative for dizziness, loss of consciousness and headaches.  Endo/Heme/Allergies: Negative for environmental allergies.  Psychiatric/Behavioral: Negative for depression. The patient is not nervous/anxious.        Objective:    Physical Exam  Constitutional: She is oriented to person, place, and time. She appears well-developed and well-nourished. No distress.  HENT:  Head: Normocephalic and atraumatic.  Nose: Nose normal.  Eyes: Right eye exhibits no discharge. Left eye exhibits no discharge.  Neck: Normal range of motion. Neck supple.  Cardiovascular: Normal rate and regular rhythm.  No murmur heard. Pulmonary/Chest: Effort normal and breath sounds normal.  Abdominal:  Soft. Bowel sounds are normal. There is no tenderness.  Musculoskeletal: She exhibits no edema.  Neurological: She is alert and oriented to person, place, and time.  Skin: Skin is warm and dry.  Psychiatric: She has a normal mood and affect.  Nursing note and vitals reviewed.   BP (!) 150/88 (BP Location: Right Arm, Patient Position: Sitting, Cuff Size: Small)   Pulse (!) 56   Temp 98.2 F (36.8 C) (Oral)   Resp 16   Ht  5' 7.5" (1.715 m)   Wt 191 lb (86.6 kg)   SpO2 96%   BMI 29.47 kg/m  Wt Readings from Last 3 Encounters:  09/03/17 191 lb (86.6 kg)  06/04/17 193 lb (87.5 kg)  05/26/16 201 lb (91.2 kg)     Lab Results  Component Value Date   WBC 7.7 06/04/2017   HGB 11.9 (L) 06/04/2017   HCT 38.3 06/04/2017   PLT 264.0 06/04/2017   GLUCOSE 99 06/04/2017   CHOL 159 06/04/2017   TRIG 87.0 06/04/2017   HDL 54.00 06/04/2017   LDLDIRECT 127.0 04/17/2016   LDLCALC 88 06/04/2017   ALT 10 06/04/2017   AST 14 06/04/2017   NA 143 06/04/2017   K 3.5 06/04/2017   CL 102 06/04/2017   CREATININE 0.75 06/04/2017   BUN 14 06/04/2017   CO2 34 (H) 06/04/2017   TSH 2.61 06/04/2017   HGBA1C 6.7 (H) 06/04/2017    Lab Results  Component Value Date   TSH 2.61 06/04/2017   Lab Results  Component Value Date   WBC 7.7 06/04/2017   HGB 11.9 (L) 06/04/2017   HCT 38.3 06/04/2017   MCV 74.8 (L) 06/04/2017   PLT 264.0 06/04/2017   Lab Results  Component Value Date   NA 143 06/04/2017   K 3.5 06/04/2017   CO2 34 (H) 06/04/2017   GLUCOSE 99 06/04/2017   BUN 14 06/04/2017   CREATININE 0.75 06/04/2017   BILITOT 0.3 06/04/2017   ALKPHOS 58 06/04/2017   AST 14 06/04/2017   ALT 10 06/04/2017   PROT 7.6 06/04/2017   ALBUMIN 4.4 06/04/2017   CALCIUM 10.0 06/04/2017   ANIONGAP 8 07/04/2014   GFR 98.32 06/04/2017   Lab Results  Component Value Date   CHOL 159 06/04/2017   Lab Results  Component Value Date   HDL 54.00 06/04/2017   Lab Results  Component Value Date    LDLCALC 88 06/04/2017   Lab Results  Component Value Date   TRIG 87.0 06/04/2017   Lab Results  Component Value Date   CHOLHDL 3 06/04/2017   Lab Results  Component Value Date   HGBA1C 6.7 (H) 06/04/2017       Assessment & Plan:   Problem List Items Addressed This Visit    HTN (hypertension)    Well controlled, no changes to meds. Encouraged heart healthy diet such as the DASH diet and exercise as tolerated.       Relevant Medications   hydrochlorothiazide (HYDRODIURIL) 25 MG tablet   Other Relevant Orders   CBC   TSH   Hyperlipidemia    Encouraged heart healthy diet, increase exercise, avoid trans fats, consider a krill oil cap daily      Relevant Medications   hydrochlorothiazide (HYDRODIURIL) 25 MG tablet   Other Relevant Orders   Lipid panel   Overweight    Encouraged DASH or MIND diet, decrease po intake and increase exercise as tolerated. Needs 7-8 hours of sleep nightly. Avoid trans fats, eat small, frequent meals every 4-5 hours with lean proteins, complex carbs and healthy fats. Minimize simple carbs,      Diabetes mellitus type 2 in obese ([facility])    hgba1c acceptable, minimize simple carbs. Increase exercise as tolerated.      Relevant Orders   Hemoglobin A1c   Comprehensive metabolic panel   Osteopenia    Need adequate exercise, calcium and vitamin d intake         I have discontinued Omah Schrack's hydrochlorothiazide. I  am also having her start on hydrochlorothiazide. Additionally, I am having her maintain her multivitamin, Vitamin B 12, aspirin, ONE TOUCH LANCETS, ONE TOUCH BASIC SYSTEM, glucose blood, glucose blood, benzonatate, simvastatin, losartan, furosemide, ferrous sulfate, atenolol, amLODipine, potassium chloride SA, and simvastatin.  Meds ordered this encounter  Medications  . hydrochlorothiazide (HYDRODIURIL) 25 MG tablet    Sig: Take 1 tablet (25 mg total) by mouth daily.    Dispense:  90 tablet    Refill:  3     Penni Homans,  MD

## 2017-09-03 NOTE — Assessment & Plan Note (Signed)
Encouraged DASH or MIND diet, decrease po intake and increase exercise as tolerated. Needs 7-8 hours of sleep nightly. Avoid trans fats, eat small, frequent meals every 4-5 hours with lean proteins, complex carbs and healthy fats. Minimize simple carbs,  

## 2017-09-03 NOTE — Assessment & Plan Note (Signed)
Encouraged heart healthy diet, increase exercise, avoid trans fats, consider a krill oil cap daily 

## 2017-09-04 LAB — COMPREHENSIVE METABOLIC PANEL
ALT: 11 U/L (ref 0–35)
AST: 14 U/L (ref 0–37)
Albumin: 4.5 g/dL (ref 3.5–5.2)
Alkaline Phosphatase: 52 U/L (ref 39–117)
BUN: 15 mg/dL (ref 6–23)
CO2: 32 mEq/L (ref 19–32)
Calcium: 10.8 mg/dL — ABNORMAL HIGH (ref 8.4–10.5)
Chloride: 100 mEq/L (ref 96–112)
Creatinine, Ser: 0.85 mg/dL (ref 0.40–1.20)
GFR: 85.04 mL/min (ref >60.00)
Glucose, Bld: 104 mg/dL — ABNORMAL HIGH (ref 70–99)
Potassium: 3.7 mEq/L (ref 3.5–5.1)
Sodium: 142 mEq/L (ref 135–145)
Total Bilirubin: 0.2 mg/dL (ref 0.2–1.2)
Total Protein: 7.7 g/dL (ref 6.0–8.3)

## 2017-09-04 LAB — LIPID PANEL
Cholesterol: 203 mg/dL — ABNORMAL HIGH (ref 0–200)
HDL: 54.6 mg/dL (ref >39.00)
LDL Cholesterol: 123 mg/dL — ABNORMAL HIGH (ref 0–99)
NonHDL: 148.84
Total CHOL/HDL Ratio: 4
Triglycerides: 129 mg/dL (ref 0.0–149.0)
VLDL: 25.8 mg/dL (ref 0.0–40.0)

## 2017-09-07 ENCOUNTER — Other Ambulatory Visit (INDEPENDENT_AMBULATORY_CARE_PROVIDER_SITE_OTHER): Payer: Self-pay

## 2017-09-07 LAB — VITAMIN D 25 HYDROXY (VIT D DEFICIENCY, FRACTURES): VITD: 28.49 ng/mL — ABNORMAL LOW (ref 30.00–100.00)

## 2017-09-10 ENCOUNTER — Encounter: Payer: Self-pay | Admitting: Family Medicine

## 2017-09-12 ENCOUNTER — Other Ambulatory Visit: Payer: Self-pay | Admitting: Family Medicine

## 2017-10-07 ENCOUNTER — Other Ambulatory Visit (INDEPENDENT_AMBULATORY_CARE_PROVIDER_SITE_OTHER): Payer: Medicare Other

## 2017-10-07 ENCOUNTER — Ambulatory Visit (INDEPENDENT_AMBULATORY_CARE_PROVIDER_SITE_OTHER): Payer: Medicare Other | Admitting: Medical

## 2017-10-07 VITALS — BP 130/82 | HR 54

## 2017-10-07 DIAGNOSIS — I10 Essential (primary) hypertension: Secondary | ICD-10-CM | POA: Diagnosis not present

## 2017-10-07 LAB — COMPREHENSIVE METABOLIC PANEL
ALT: 13 U/L (ref 0–35)
AST: 16 U/L (ref 0–37)
Albumin: 4.1 g/dL (ref 3.5–5.2)
Alkaline Phosphatase: 55 U/L (ref 39–117)
BUN: 13 mg/dL (ref 6–23)
CO2: 34 mEq/L — ABNORMAL HIGH (ref 19–32)
Calcium: 10 mg/dL (ref 8.4–10.5)
Chloride: 105 mEq/L (ref 96–112)
Creatinine, Ser: 0.78 mg/dL (ref 0.40–1.20)
GFR: 93.88 mL/min (ref >60.00)
Glucose, Bld: 114 mg/dL — ABNORMAL HIGH (ref 70–99)
Potassium: 4.4 mEq/L (ref 3.5–5.1)
Sodium: 145 mEq/L (ref 135–145)
Total Bilirubin: 0.3 mg/dL (ref 0.2–1.2)
Total Protein: 7.3 g/dL (ref 6.0–8.3)

## 2017-10-07 NOTE — Patient Instructions (Signed)
Stop in the lab before you leave today. Continue your current blood pressure medications.  Follow up with Dr Charlett Blake as scheduled in August.

## 2017-10-07 NOTE — Progress Notes (Signed)
Pre visit review using our clinic review tool, if applicable. No additional management support is needed unless otherwise documented below in the visit note.  Pt here for blood pressure check and cmet per Dr Charlett Blake.  Last visit HCTZ was increased to 25mg  once a day. Pt is also taking: Amlodipine 5mg  daily Atenolol 50mg  daily Losartan 100mg  daily  BP today @ 10:12 = 130/82 HR = 54   BP Readings from Last 3 Encounters:  09/03/17 (!) 150/88  06/04/17 (!) 158/90  05/26/16 138/78   Advised pt per covering Provider, [place], Pa-C to continue your current blood pressure medications. Complete lab work today. Follow up with Dr Charlett Blake as scheduled in August.  This is advice I gave on day of service.  Mackie Pai, PA-C

## 2017-10-16 ENCOUNTER — Other Ambulatory Visit: Payer: Self-pay | Admitting: Family Medicine

## 2017-10-20 NOTE — Progress Notes (Addendum)
Subjective:   Morgan Medina is a 72 y.o. female who presents for Medicare Annual (Subsequent) preventive examination. [employer].  Review of Systems: No ROS.  Medicare Wellness Visit. Additional risk factors are reflected in the social history.  Cardiac Risk Factors include: advanced age (>68mn, >>53women);diabetes mellitus;dyslipidemia;hypertension Sleep patterns: Stays up late. Sleeps 5-6 hrs. Doesn't nap. Home Safety/Smoke Alarms: Feels safe in home. Smoke alarms in place.  Living environment; residence and Firearm Safety: Lives alone in 2 story home. 2 granddaughters live with her. Seat Belt Safety/Bike Helmet: Wears seat belt.   Female:   Pap- last 07/19/14      Mammo-utd       Dexa scan-utd        CCS- due 04/17/18 Eye-Dr.Adkins yearly. Wears glasses.  Objective:     Vitals: BP 136/80 (BP Location: Left Arm, Patient Position: Sitting, Cuff Size: Normal)   Pulse (!) 53   Ht '5\' 8"'  (1.727 m)   Wt 197 lb 3.2 oz (89.4 kg)   SpO2 97%   BMI 29.98 kg/m   Body mass index is 29.98 kg/m.  Advanced Directives 10/22/2017 04/17/2016 07/04/2014  Does Patient Have a Medical Advance Directive? No No No  Would patient like information on creating a medical advance directive? Yes (MAU/Ambulatory/Procedural Areas - Information given) No - patient declined information -    Tobacco Social History   Tobacco Use  Smoking Status Never Smoker  Smokeless Tobacco Never Used     Counseling given: Not Answered   Clinical Intake: Pain : No/denies pain  Past Medical History:  Diagnosis Date  . Anemia   . Cerebral aneurysm    h/o in 2006  . Dacrocystitis 07/19/2015   right  . Diabetes mellitus type 2 in obese ([place] 11/05/2014  . GERD (gastroesophageal reflux disease) 04/17/2016  . Hyperlipidemia   . Hypertension   . Osteoarthritis of left hip 12/02/2015  . Osteopenia 11/22/2015  . Overweight    Past Surgical History:  Procedure Laterality Date  . ABDOMINAL HYSTERECTOMY    .  CEREBRAL ANEURYSM REPAIR     Family History  Problem Relation Age of Onset  . Cerebral aneurysm Father   . Alzheimer's disease Mother   . Cancer Maternal Aunt        breast  . Diabetes Maternal Grandmother   . Stroke Maternal Grandfather   . Alzheimer's disease Paternal Grandmother   . Stroke Paternal Grandfather    Social History   Socioeconomic History  . Marital status: Widowed    Spouse name: Not on file  . Number of children: Not on file  . Years of education: Not on file  . Highest education level: Not on file  Occupational History  . Not on file  Social Needs  . Financial resource strain: Not on file  . Food insecurity:    Worry: Not on file    Inability: Not on file  . Transportation needs:    Medical: Not on file    Non-medical: Not on file  Tobacco Use  . Smoking status: Never Smoker  . Smokeless tobacco: Never Used  Substance and Sexual Activity  . Alcohol use: No  . Drug use: No  . Sexual activity: Never    Comment: lives with self, no dietary restrictions. 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 Physical activity:    Days per week: Not on file    Minutes per session: Not on file  . Stress: Not on  file  Relationships  . Social connections:    Talks on phone: Not on file    Gets together: Not on file    Attends religious service: Not on file    Active member of club or organization: Not on file    Attends meetings of clubs or organizations: Not on file    Relationship status: Not on file  Other Topics Concern  . Not on file  Social History Narrative  . Not on file    Outpatient Encounter Medications as of 10/22/2017  Medication Sig  . amLODipine (NORVASC) 5 MG tablet TAKE 1 TABLET BY MOUTH EVERY DAY  . aspirin 81 MG tablet Takes every other day  . atenolol (TENORMIN) 50 MG tablet Take 1 tablet (50 mg total) by mouth daily.  . benzonatate (TESSALON) 100 MG capsule Take 1 capsule (100 mg total) by mouth 3 (three) times daily  as needed for cough.  . Blood Glucose Monitoring Suppl ([place]) W/DEVICE KIT Use to check blood sugar. DX. E11.9  . Cyanocobalamin (VITAMIN B 12) 250 MCG LOZG Take by mouth.  . ferrous sulfate 325 (65 FE) MG tablet Take 1 tablet (325 mg total) by mouth daily with breakfast.  . furosemide (LASIX) 20 MG tablet TAKE 2 TABLETS (40 MG TOTAL) EVERY MORNING BY MOUTH.  Marland Kitchen glucose blood (ONE TOUCH ULTRA TEST) test strip Use as idirected once daily.  DX E11.9  . glucose blood test strip Test once daily to check blood sugar.  DX E11.9  . hydrochlorothiazide (HYDRODIURIL) 25 MG tablet Take 1 tablet (25 mg total) by mouth daily.  Marland Kitchen losartan (COZAAR) 100 MG tablet Take 1 tablet (100 mg total) every morning by mouth.  . Multiple Vitamin (MULTIVITAMIN) capsule Take 1 capsule by mouth daily.  . ONE TOUCH LANCETS MISC Use as directed once daily to check blood sugar. DX: E11.9  . potassium chloride SA (KLOR-CON M20) 20 MEQ tablet Take 1 tablet (20 mEq total) by mouth daily.  . simvastatin (ZOCOR) 20 MG tablet Take 1 tablet (20 mg total) by mouth daily.  . [DISCONTINUED] simvastatin (ZOCOR) 20 MG tablet TAKE 1 TABLET BY MOUTH DAILY   No facility-administered encounter medications on file as of 10/22/2017.     Activities of Daily Living In your present state of health, do you have any difficulty performing the following activities: 10/22/2017  Hearing? N  Vision? N  Difficulty concentrating or making decisions? N  Walking or climbing stairs? N  Dressing or bathing? N  Doing errands, shopping? N  Preparing Food and eating ? N  Using the Toilet? N  In the past six months, have you accidently leaked urine? N  Do you have problems with loss of bowel control? N  Managing your Medications? N  Managing your Finances? N  Housekeeping or managing your Housekeeping? N  Some recent data might be hidden    Patient Care Team: Mosie Lukes, MD as PCP - General (Family Medicine) Rolley Sims, [place] as  Consulting Physician (Optometry)    Assessment:   This is a routine wellness examination for [name]. Physical assessment deferred to PCP.  Exercise Activities and Dietary recommendations Current Exercise Habits: The patient does not participate in regular exercise at present, Exercise limited by: None identified   Diet (meal preparation, eat out, water intake, caffeinated beverages, dairy products, fruits and vegetables): in general, an "unhealthy" diet, on average, 3 meals per day Breakfast: pancakes and sausage and eggs Lunch: BBQ sandwich, chips, cake,water.  Dinner:  Music therapist and sweet potato pie    Goals    . Eat a healther diet    . Increase physical activity       Fall Risk Fall Risk  10/22/2017 09/03/2017 04/17/2016 11/22/2015 05/02/2014  Falls in the past year? No No Yes No No  Number falls in past yr: - - 1 - -  Comment - - Pt fell "in a dip in the road." - -  Injury with Fall? - - No - -     Depression Screen PHQ 2/9 Scores 10/22/2017 09/03/2017 04/17/2016 11/22/2015  PHQ - 2 Score 0 1 0 0  PHQ- 9 Score - 6 - -     Cognitive Function Ad8 score reviewed for issues:  Issues making decisions:no  Less interest in hobbies / activities:no  Repeats questions, stories (family complaining):no  Trouble using ordinary gadgets (microwave, computer, phone):no  Forgets the month or year: no  Mismanaging finances: no  Remembering appts:no  Daily problems with thinking and/or memory:no Ad8 score is=0   MMSE - Mini Mental State Exam 04/17/2016  Orientation to time 5  Orientation to Place 5  Registration 3  Attention/ Calculation 5  Recall 3  Language- name 2 objects 2  Language- repeat 1  Language- follow 3 step command 3  Language- read & follow direction 1  Write a sentence 1  Copy design 1  Total score 30        Immunization History  Administered Date(s) Administered  . Influenza, High Dose Seasonal PF 04/17/2016, 06/04/2017  . Influenza,inj,Quad PF,6+ Mos  04/11/2014, 05/23/2015    Screening Tests Health Maintenance  Topic Date Due  . Hepatitis C Screening  Mar 01, 1946  . FOOT EXAM  09/04/1957  . PNA vac Low Risk Adult (1 of 2 - PCV13) 09/04/2012  . OPHTHALMOLOGY EXAM  08/23/2016  . INFLUENZA VACCINE  01/14/2018  . HEMOGLOBIN A1C  03/06/2018  . MAMMOGRAM  06/05/2019  . COLONOSCOPY  06/15/2020  . TETANUS/TDAP  06/15/2020  . DEXA SCAN  Addressed    Plan:   Follows up with Dr.Blyth 02/02/18  Continue to eat heart healthy diet (full of fruits, vegetables, whole grains, lean protein, water--limit salt, fat, and sugar intake) and increase physical activity as tolerated.  Continue doing brain stimulating activities (puzzles, reading, adult coloring books, staying active) to keep memory sharp.   Bring a copy of your living will and/or healthcare power of attorney to your next office visit.   I have personally reviewed and noted the following in the patient's chart:   . Medical and social history . Use of alcohol, tobacco or illicit drugs  . Current medications and supplements . Functional ability and status . Nutritional status . Physical activity . Advanced directives . List of other physicians . Hospitalizations, surgeries, and ER visits in previous 12 months . Vitals . Screenings to include cognitive, depression, and falls . Referrals and appointments  In addition, I have reviewed and discussed with patient certain preventive protocols, quality metrics, and best practice recommendations. A written personalized care plan for preventive services as well as general preventive health recommendations were provided to patient.     Shela Nevin, [place]  10/22/2017   Medical screening examination/treatment was performed by qualified clinical staff member and as supervising physician I was immediately available for consultation/collaboration. I have reviewed documentation and agree with assessment and plan.  Penni Homans, MD

## 2017-10-20 NOTE — Telephone Encounter (Signed)
[facility] calling checking on status of refill.

## 2017-10-22 ENCOUNTER — Ambulatory Visit (INDEPENDENT_AMBULATORY_CARE_PROVIDER_SITE_OTHER): Payer: Medicare Other | Admitting: *Deleted

## 2017-10-22 ENCOUNTER — Encounter: Payer: Self-pay | Admitting: *Deleted

## 2017-10-22 VITALS — BP 136/80 | HR 53 | Ht 68.0 in | Wt 197.2 lb

## 2017-10-22 DIAGNOSIS — Z0000 Encounter for general adult medical examination without abnormal findings: Secondary | ICD-10-CM

## 2017-10-22 NOTE — Patient Instructions (Signed)
Follows up with Dr.Blyth 02/02/18  Continue to eat heart healthy diet (full of fruits, vegetables, whole grains, lean protein, water--limit salt, fat, and sugar intake) and increase physical activity as tolerated.  Continue doing brain stimulating activities (puzzles, reading, adult coloring books, staying active) to keep memory sharp.   Bring a copy of your living will and/or healthcare power of attorney to your next office visit.   Morgan Medina , Thank you for taking time to come for your Medicare Wellness Visit. I appreciate your ongoing commitment to your health goals. Please review the following plan we discussed and let me know if I can assist you in the future.   These are the goals we discussed: Goals    . Eat a healther diet    . Increase physical activity       This is a list of the screening recommended for you and due dates:  Health Maintenance  Topic Date Due  .  Hepatitis C: One time screening is recommended by Center for Disease Control  (CDC) for  adults born from [birth year] through 1963.   05/31/1946  . Complete foot exam   09/04/1957  . Pneumonia vaccines (1 of 2 - PCV13) 09/04/2012  . Eye exam for diabetics  08/23/2016  . Flu Shot  01/14/2018  . Hemoglobin A1C  03/06/2018  . Mammogram  06/05/2019  . Colon Cancer Screening  06/15/2020  . Tetanus Vaccine  06/15/2020  . DEXA scan (bone density measurement)  Addressed

## 2017-11-11 ENCOUNTER — Other Ambulatory Visit: Payer: Self-pay | Admitting: Family Medicine

## 2017-11-25 ENCOUNTER — Other Ambulatory Visit: Payer: Self-pay | Admitting: Family Medicine

## 2017-12-04 ENCOUNTER — Encounter (HOSPITAL_BASED_OUTPATIENT_CLINIC_OR_DEPARTMENT_OTHER): Payer: Self-pay | Admitting: Emergency Medicine

## 2017-12-04 ENCOUNTER — Emergency Department (HOSPITAL_BASED_OUTPATIENT_CLINIC_OR_DEPARTMENT_OTHER): Payer: Medicare Other

## 2017-12-04 ENCOUNTER — Other Ambulatory Visit: Payer: Self-pay

## 2017-12-04 ENCOUNTER — Emergency Department (HOSPITAL_BASED_OUTPATIENT_CLINIC_OR_DEPARTMENT_OTHER)
Admission: EM | Admit: 2017-12-04 | Discharge: 2017-12-04 | Disposition: A | Discharge status: Home / Self Care | Payer: Medicare Other | Attending: Emergency Medicine | Admitting: Emergency Medicine

## 2017-12-04 DIAGNOSIS — R079 Chest pain, unspecified: Secondary | ICD-10-CM | POA: Diagnosis present

## 2017-12-04 DIAGNOSIS — Z7982 Long term (current) use of aspirin: Secondary | ICD-10-CM | POA: Insufficient documentation

## 2017-12-04 DIAGNOSIS — Z79899 Other long term (current) drug therapy: Secondary | ICD-10-CM | POA: Diagnosis not present

## 2017-12-04 DIAGNOSIS — R0789 Other chest pain: Secondary | ICD-10-CM

## 2017-12-04 DIAGNOSIS — E119 Type 2 diabetes mellitus without complications: Secondary | ICD-10-CM | POA: Diagnosis not present

## 2017-12-04 DIAGNOSIS — I10 Essential (primary) hypertension: Secondary | ICD-10-CM | POA: Diagnosis not present

## 2017-12-04 LAB — CBC WITH DIFFERENTIAL/PLATELET
Basophils Absolute: 0 10*3/uL (ref 0.0–0.1)
Basophils Relative: 0 %
Eosinophils Absolute: 0.1 10*3/uL (ref 0.0–0.7)
Eosinophils Relative: 1 %
HCT: 36.4 % (ref 36.0–46.0)
Hemoglobin: 11.8 g/dL — ABNORMAL LOW (ref 12.0–15.0)
Lymphocytes Relative: 38 %
Lymphs Abs: 3.2 10*3/uL (ref 0.7–4.0)
MCH: 24.2 pg — ABNORMAL LOW (ref 26.0–34.0)
MCHC: 32.4 g/dL (ref 30.0–36.0)
MCV: 74.7 fL — ABNORMAL LOW (ref 78.0–100.0)
Monocytes Absolute: 0.5 10*3/uL (ref 0.1–1.0)
Monocytes Relative: 6 %
Neutro Abs: 4.7 10*3/uL (ref 1.7–7.7)
Neutrophils Relative %: 55 %
Platelets: 277 10*3/uL (ref 150–400)
RBC: 4.87 MIL/uL (ref 3.87–5.11)
RDW: 14.1 % (ref 11.5–15.5)
WBC: 8.5 10*3/uL (ref 4.0–10.5)

## 2017-12-04 LAB — COMPREHENSIVE METABOLIC PANEL
ALT: 17 U/L (ref 14–54)
AST: 18 U/L (ref 15–41)
Albumin: 4.2 g/dL (ref 3.5–5.0)
Alkaline Phosphatase: 52 U/L (ref 38–126)
Anion gap: 9 (ref 5–15)
BUN: 18 mg/dL (ref 6–20)
CO2: 30 mmol/L (ref 22–32)
Calcium: 9.4 mg/dL (ref 8.9–10.3)
Chloride: 103 mmol/L (ref 101–111)
Creatinine, Ser: 0.7 mg/dL (ref 0.44–1.00)
GFR calc Af Amer: >60 mL/min (ref >60)
GFR calc non Af Amer: >60 mL/min (ref >60)
Glucose, Bld: 94 mg/dL (ref 65–99)
Potassium: 3.4 mmol/L — ABNORMAL LOW (ref 3.5–5.1)
Sodium: 142 mmol/L (ref 135–145)
Total Bilirubin: 0.4 mg/dL (ref 0.3–1.2)
Total Protein: 7.9 g/dL (ref 6.5–8.1)

## 2017-12-04 LAB — TROPONIN I
Troponin I: <0.03 ng/mL (ref <0.03)
Troponin I: <0.03 ng/mL (ref <0.03)

## 2017-12-04 MED ORDER — DICLOFENAC SODIUM 1 % TD GEL: 4.0000 g | Freq: Four times a day (QID) | TRANSDERMAL | 0 refills | Status: DC

## 2017-12-04 MED ORDER — CYCLOBENZAPRINE HCL 5 MG PO TABS
5.0000 mg | ORAL_TABLET | Freq: Once | ORAL | Status: AC
  Administered 2017-12-04: 5 mg via ORAL
  Filled 2017-12-04: qty 1

## 2017-12-04 MED ORDER — KETOROLAC TROMETHAMINE 30 MG/ML IJ SOLN
15.0000 mg | Freq: Once | INTRAMUSCULAR | Status: AC
  Administered 2017-12-04: 15 mg via INTRAVENOUS
  Filled 2017-12-04: qty 1

## 2017-12-04 MED ORDER — CYCLOBENZAPRINE HCL 5 MG PO TABS: 5.0000 mg | ORAL_TABLET | Freq: Three times a day (TID) | ORAL | 0 refills | Status: DC | PRN

## 2017-12-04 NOTE — Discharge Instructions (Addendum)
Take motrin 600 mg every 6 hrs for pain.   Take flexeril for muscle spasms.   Use Voltaren gel for muscle spasms.   See your doctor. Consider getting stress test with your doctor or cardiologist.   Return to ER if you have worse chest pain, shortness of breath.

## 2017-12-04 NOTE — ED Triage Notes (Signed)
R side chest pain radiating to R arm since yesterday. Denies SOB

## 2017-12-04 NOTE — ED Provider Notes (Signed)
[facility] EMERGENCY DEPARTMENT Provider Note   CSN: [id number] Arrival date & time: 12/04/17  1619     History   Chief Complaint Chief Complaint  Patient presents with  . Chest Pain    HPI Morgan Medina is a 72 y.o. female hx of DM, HTN, HL, here with right-sided chest pain.  Patient states that she has right-sided chest pain 2 nights ago.  Patient states that the pain is worse with movement and she thought she had a pulled muscle at that time.  She has been trying heating pad with some relief as well as some Tylenol.  Patient states that the pain does not completely go away even with the medicines.  Patient denies any shortness of breath or recent travel or leg swelling.  Patient denies any cardiac history but has family history of CAD.   The history is provided by the patient.    Past Medical History:  Diagnosis Date  . Anemia   . Cerebral aneurysm    h/o in 2006  . Dacrocystitis 07/19/2015   right  . Diabetes mellitus type 2 in obese ([place]) 11/05/2014  . GERD (gastroesophageal reflux disease) 04/17/2016  . Hyperlipidemia   . Hypertension   . Osteoarthritis of left hip 12/02/2015  . Osteopenia 11/22/2015  . Overweight     Patient Active Problem List   Diagnosis Date Noted  . GERD (gastroesophageal reflux disease) 04/17/2016  . Osteoarthritis of left hip 12/02/2015  . Preventative health care 11/22/2015  . Osteopenia 11/22/2015  . Pain in the chest 05/11/2015  . Diabetes mellitus type 2 in obese ([place]) 11/05/2014  . Overweight   . Herpes zoster  history of facial zoster 05/02/2014  . Anemia 11/28/2013  . HTN (hypertension) 11/28/2013  . Hyperlipidemia 11/28/2013  . S/P abdominal supracervical subtotal hysterectomy 11/28/2013  . Colon polyps 11/28/2013  . Cerebral aneurysm  S/P coiling  [facility]  2006   11/28/2013  . Menopause 11/28/2013    Past Surgical History:  Procedure Laterality Date  . ABDOMINAL HYSTERECTOMY    . CEREBRAL ANEURYSM REPAIR        OB History    Gravida  0   Para  0   Term  0   Preterm  0   AB  0   Living  0     SAB  0   TAB  0   Ectopic  0   Multiple  0   Live Births               Home Medications    Prior to Admission medications   Medication Sig Start Date End Date Taking? Authorizing Provider  amLODipine (NORVASC) 5 MG tablet TAKE 1 TABLET BY MOUTH EVERY DAY 10/20/17   Mosie Lukes, MD  aspirin 81 MG tablet Takes every other day    [provider]  atenolol (TENORMIN) 50 MG tablet Take 1 tablet (50 mg total) by mouth daily. 06/04/17   Mosie Lukes, MD  benzonatate (TESSALON) 100 MG capsule Take 1 capsule (100 mg total) by mouth 3 (three) times daily as needed for cough. 05/26/16   Mosie Lukes, MD  Blood Glucose Monitoring Suppl ([place]) W/DEVICE KIT Use to check blood sugar. DX. E11.9 11/01/14   Mosie Lukes, MD  Cyanocobalamin (VITAMIN B 12) 250 MCG LOZG Take by mouth.    [provider]  ferrous sulfate 325 (65 FE) MG tablet TAKE 1 TABLET BY MOUTH EVERY DAY  WITH BREAKFAST 11/11/17   Mosie Lukes, MD  furosemide (LASIX) 20 MG tablet TAKE 2 TABLETS (40 MG TOTAL) EVERY MORNING BY MOUTH. 09/15/17   Mosie Lukes, MD  glucose blood (ONE TOUCH ULTRA TEST) test strip Use as idirected once daily.  DX E11.9 12/04/14   Mosie Lukes, MD  glucose blood test strip Test once daily to check blood sugar.  DX E11.9 11/27/14   Mosie Lukes, MD  hydrochlorothiazide (HYDRODIURIL) 25 MG tablet Take 1 tablet (25 mg total) by mouth daily. 09/03/17   Mosie Lukes, MD  losartan (COZAAR) 100 MG tablet TAKE 1 TABLET (100 MG TOTAL) EVERY MORNING BY MOUTH. 11/25/17   Mosie Lukes, MD  Multiple Vitamin (MULTIVITAMIN) capsule Take 1 capsule by mouth daily.    [provider]  ONE TOUCH LANCETS MISC Use as directed once daily to check blood sugar. DX: E11.9 10/31/14   Mosie Lukes, MD  potassium chloride SA (KLOR-CON M20) 20 MEQ tablet Take 1 tablet  (20 mEq total) by mouth daily. 06/04/17   Mosie Lukes, MD  simvastatin (ZOCOR) 20 MG tablet Take 1 tablet (20 mg total) by mouth daily. 12/23/16   Mosie Lukes, MD    Family History Family History  Problem Relation Age of Onset  . Cerebral aneurysm Father   . Alzheimer's disease Mother   . Cancer Maternal Aunt        breast  . Diabetes Maternal Grandmother   . Stroke Maternal Grandfather   . Alzheimer's disease Paternal Grandmother   . Stroke Paternal Grandfather     Social History Social History   Tobacco Use  . Smoking status: Never Smoker  . Smokeless tobacco: Never Used  Substance Use Topics  . Alcohol use: No  . Drug use: No     Allergies   Patient has no known allergies.   Review of Systems Review of Systems  Cardiovascular: Positive for chest pain.  All other systems reviewed and are negative.    Physical Exam Updated Vital Signs BP (!) 143/76   Pulse 61   Temp 98.1 F (36.7 C) (Oral)   Resp 15   Ht '5\' 8"'  (1.727 m)   Wt 90.3 kg (199 lb)   SpO2 98%   BMI 30.26 kg/m   Physical Exam  Constitutional: She is oriented to person, place, and time. She appears well-developed.  HENT:  Head: Normocephalic.  Eyes: Pupils are equal, round, and reactive to light. EOM are normal.  Neck: Normal range of motion.  Cardiovascular: Regular rhythm and normal pulses.  Pulmonary/Chest: Effort normal and breath sounds normal.  Reproducible R chest wall tenderness, lungs clear   Abdominal: Soft. Bowel sounds are normal.  Musculoskeletal: Normal range of motion.       Right lower leg: Normal. She exhibits no tenderness and no edema.       Left lower leg: Normal. She exhibits no tenderness and no edema.  No calf tenderness   Neurological: She is alert and oriented to person, place, and time.  Skin: Skin is warm. Capillary refill takes less than 2 seconds.  Psychiatric: She has a normal mood and affect. Her behavior is normal.  Nursing note and vitals  reviewed.    ED Treatments / Results  Labs (all labs ordered are listed, but only abnormal results are displayed) Labs Reviewed  CBC WITH DIFFERENTIAL/PLATELET - Abnormal; Notable for the following components:      Result Value   Hemoglobin 11.8 (*)  MCV 74.7 (*)    MCH 24.2 (*)    All other components within normal limits  COMPREHENSIVE METABOLIC PANEL - Abnormal; Notable for the following components:   Potassium 3.4 (*)    All other components within normal limits  TROPONIN I  TROPONIN I    EKG EKG Interpretation  Date/Time:  Friday December 04 2017 16:41:07 EDT Ventricular Rate:  61 PR Interval:    QRS Duration: 110 QT Interval:  460 QTC Calculation: 464 R Axis:   -8 Text Interpretation:  Sinus rhythm No significant change since last tracing Confirmed by Wandra Arthurs [phone]) on 12/04/2017 4:48:28 PM   Radiology Dg Chest 2 View  Result Date: 12/04/2017 CLINICAL DATA:  Chest pain. EXAM: CHEST - 2 VIEW COMPARISON:  Radiographs of May 02, 2015. FINDINGS: The heart size and mediastinal contours are within normal limits. Both lungs are clear. No pneumothorax or pleural effusion is noted. The visualized skeletal structures are unremarkable. IMPRESSION: No active cardiopulmonary disease. Electronically Signed   By: Marijo Conception, M.D.   On: 12/04/2017 17:06    Procedures Procedures (including critical care time)  Medications Ordered in ED Medications  cyclobenzaprine (FLEXERIL) tablet 5 mg (5 mg Oral Given 12/04/17 1714)  ketorolac (TORADOL) 30 MG/ML injection 15 mg (15 mg Intravenous Given 12/04/17 1809)     Initial Impression / Assessment and Plan / ED Course  I have reviewed the triage vital signs and the nursing notes.  Pertinent labs & imaging results that were available during my care of the patient were reviewed by me and considered in my medical decision making (see chart for details).     Morgan Medina is a 72 y.o. female here with R sided chest pain. I  think likely chest wall pain. She had no recent travel or signs of PE/DVT. She is not tachycardic or hypoxic. Pain for 2 days and appears atypical for ACS so trop x 1 sufficient. Will get labs, trop, CXR.   7:52 PM CXR clear. Labs and trop neg. Pain improved with motrin, flexeril. Will dc home with flexeril, voltaren gel, NSAIDs.   Final Clinical Impressions(s) / ED Diagnoses   Final diagnoses:  None    ED Discharge Orders    None       Drenda Freeze, MD 12/04/17 [id number]

## 2017-12-10 ENCOUNTER — Ambulatory Visit (HOSPITAL_BASED_OUTPATIENT_CLINIC_OR_DEPARTMENT_OTHER)
Admission: RE | Admit: 2017-12-10 | Discharge: 2017-12-10 | Disposition: A | Discharge status: Home / Self Care | Payer: Medicare Other | Source: Ambulatory Visit | Attending: Family Medicine | Admitting: Family Medicine

## 2017-12-10 ENCOUNTER — Encounter: Payer: Self-pay | Admitting: Family Medicine

## 2017-12-10 ENCOUNTER — Other Ambulatory Visit: Payer: Self-pay | Admitting: Family Medicine

## 2017-12-10 ENCOUNTER — Ambulatory Visit: Payer: Medicare Other | Admitting: Family Medicine

## 2017-12-10 VITALS — BP 132/78 | HR 54 | Temp 98.2°F | Resp 18 | Wt 195.8 lb

## 2017-12-10 DIAGNOSIS — E1169 Type 2 diabetes mellitus with other specified complication: Secondary | ICD-10-CM | POA: Diagnosis not present

## 2017-12-10 DIAGNOSIS — E669 Obesity, unspecified: Secondary | ICD-10-CM

## 2017-12-10 DIAGNOSIS — R079 Chest pain, unspecified: Secondary | ICD-10-CM

## 2017-12-10 DIAGNOSIS — M47812 Spondylosis without myelopathy or radiculopathy, cervical region: Secondary | ICD-10-CM | POA: Diagnosis not present

## 2017-12-10 DIAGNOSIS — M47814 Spondylosis without myelopathy or radiculopathy, thoracic region: Secondary | ICD-10-CM | POA: Insufficient documentation

## 2017-12-10 DIAGNOSIS — M546 Pain in thoracic spine: Secondary | ICD-10-CM

## 2017-12-10 DIAGNOSIS — M542 Cervicalgia: Secondary | ICD-10-CM

## 2017-12-10 DIAGNOSIS — I10 Essential (primary) hypertension: Secondary | ICD-10-CM

## 2017-12-10 DIAGNOSIS — E782 Mixed hyperlipidemia: Secondary | ICD-10-CM | POA: Diagnosis not present

## 2017-12-10 MED ORDER — TIZANIDINE HCL 4 MG PO TABS: 4.0000 mg | ORAL_TABLET | Freq: Three times a day (TID) | ORAL | 1 refills | Status: DC | PRN

## 2017-12-10 MED FILL — tiZANidine HCL 4 MG TABS: 4 | 20 days supply | Qty: 60 | Fill #0

## 2017-12-10 NOTE — Progress Notes (Signed)
Subjective:  I acted as a Education administrator for Dr. Charlett Blake. [place], [place]  Patient ID: Morgan Medina, female    DOB: [birth year], 72 y.o.   MRN: [id number]  No chief complaint on file.   HPI  Patient is in today for a hospital follow up after presenting to the ER righ right shoulder blade pain and right upper chest wall pain. No fall or injury. Pain can be made better and worse with movement. No associated symptoms such as SOB/palpitations/nausea/diaphoresis. She reports the pain was 9.5 of 10 and is now 4 of 10. Her work up was negative for cardiac cause. Denies congestion/fevers/GI or GU c/o. Taking meds as prescribed Patient Care Team: Mosie Lukes, MD as PCP - General (Family Medicine) Rolley Sims, [place] as Consulting Physician (Optometry)   Past Medical History:  Diagnosis Date  . Anemia   . Cerebral aneurysm    h/o in 2006  . Dacrocystitis 07/19/2015   right  . Diabetes mellitus type 2 in obese ([place]) 11/05/2014  . GERD (gastroesophageal reflux disease) 04/17/2016  . Hyperlipidemia   . Hypertension   . Osteoarthritis of left hip 12/02/2015  . Osteopenia 11/22/2015  . Overweight     Past Surgical History:  Procedure Laterality Date  . ABDOMINAL HYSTERECTOMY    . CEREBRAL ANEURYSM REPAIR      Family History  Problem Relation Age of Onset  . Cerebral aneurysm Father   . Alzheimer's disease Mother   . Cancer Maternal Aunt        breast  . Diabetes Maternal Grandmother   . Stroke Maternal Grandfather   . Alzheimer's disease Paternal Grandmother   . Stroke Paternal Grandfather     Social History   Socioeconomic History  . Marital status: Widowed    Spouse name: Not on file  . Number of children: Not on file  . Years of education: Not on file  . Highest education level: Not on file  Occupational History  . Not on file  Social Needs  . Financial resource strain: Not on file  . Food insecurity:    Worry: Not on file    Inability: Not on file  . Transportation needs:   Medical: Not on file    Non-medical: Not on file  Tobacco Use  . Smoking status: Never Smoker  . Smokeless tobacco: Never Used  Substance and Sexual Activity  . Alcohol use: No  . Drug use: No  . Sexual activity: Never    Comment: lives with self, no dietary restrictions. works as a Theme park manager, retired from state employment  Lifestyle  . Physical activity:    Days per week: Not on file    Minutes per session: Not on file  . Stress: Not on file  Relationships  . Social connections:    Talks on phone: Not on file    Gets together: Not on file    Attends religious service: Not on file    Active member of club or organization: Not on file    Attends meetings of clubs or organizations: Not on file    Relationship status: Not on file  . Intimate partner violence:    Fear of current or ex partner: Not on file    Emotionally abused: Not on file    Physically abused: Not on file    Forced sexual activity: Not on file  Other Topics Concern  . Not on file  Social History Narrative  . Not on file    Outpatient Medications  Prior to Visit  Medication Sig Dispense Refill  . amLODipine (NORVASC) 5 MG tablet TAKE 1 TABLET BY MOUTH EVERY DAY 30 tablet 5  . aspirin 81 MG tablet Takes every other day    . atenolol (TENORMIN) 50 MG tablet Take 1 tablet (50 mg total) by mouth daily. 30 tablet 3  . benzonatate (TESSALON) 100 MG capsule Take 1 capsule (100 mg total) by mouth 3 (three) times daily as needed for cough. 30 capsule 1  . Blood Glucose Monitoring Suppl ([place]) W/DEVICE KIT Use to check blood sugar. DX. E11.9 1 each 0  . Cyanocobalamin (VITAMIN B 12) 250 MCG LOZG Take by mouth.    . diclofenac sodium (VOLTAREN) 1 % GEL Apply 4 g topically 4 (four) times daily. 100 g 0  . ferrous sulfate 325 (65 FE) MG tablet TAKE 1 TABLET BY MOUTH EVERY DAY WITH BREAKFAST 90 tablet 1  . furosemide (LASIX) 20 MG tablet TAKE 2 TABLETS (40 MG TOTAL) EVERY MORNING BY MOUTH. 60 tablet 1  .  glucose blood (ONE TOUCH ULTRA TEST) test strip Use as idirected once daily.  DX E11.9 100 each 6  . glucose blood test strip Test once daily to check blood sugar.  DX E11.9 100 each 11  . hydrochlorothiazide (HYDRODIURIL) 25 MG tablet Take 1 tablet (25 mg total) by mouth daily. 90 tablet 3  . losartan (COZAAR) 100 MG tablet TAKE 1 TABLET (100 MG TOTAL) EVERY MORNING BY MOUTH. 90 tablet 1  . Multiple Vitamin (MULTIVITAMIN) capsule Take 1 capsule by mouth daily.    . ONE TOUCH LANCETS MISC Use as directed once daily to check blood sugar. DX: E11.9 100 each 0  . potassium chloride SA (KLOR-CON M20) 20 MEQ tablet Take 1 tablet (20 mEq total) by mouth daily. 30 tablet 3  . simvastatin (ZOCOR) 20 MG tablet Take 1 tablet (20 mg total) by mouth daily. 30 tablet 1  . cyclobenzaprine (FLEXERIL) 5 MG tablet Take 1 tablet (5 mg total) by mouth 3 (three) times daily as needed for muscle spasms. 10 tablet 0   No facility-administered medications prior to visit.     No Known Allergies  Review of Systems  Constitutional: Negative for chills, fever and malaise/fatigue.  HENT: Negative for congestion and hearing loss.   Eyes: Negative for blurred vision and discharge.  Respiratory: Negative for cough, sputum production and shortness of breath.   Cardiovascular: Positive for chest pain. Negative for palpitations, leg swelling and PND.  Gastrointestinal: Negative for abdominal pain, blood in stool, constipation, diarrhea, heartburn, nausea and vomiting.  Genitourinary: Negative for dysuria, frequency, hematuria and urgency.  Musculoskeletal: Positive for back pain, joint pain, myalgias and neck pain. Negative for falls.  Skin: Negative for rash.  Neurological: Negative for dizziness, sensory change, loss of consciousness, weakness and headaches.  Endo/Heme/Allergies: Negative for environmental allergies. Does not bruise/bleed easily.  Psychiatric/Behavioral: Negative for depression and suicidal ideas. The  patient is not nervous/anxious and does not have insomnia.        Objective:    Physical Exam  Constitutional: She is oriented to person, place, and time. She appears well-developed and well-nourished. No distress.  HENT:  Head: Normocephalic and atraumatic.  Nose: Nose normal.  Eyes: Right eye exhibits no discharge. Left eye exhibits no discharge.  Neck: Normal range of motion. Neck supple.  Cardiovascular: Normal rate and regular rhythm.  No murmur heard. Pulmonary/Chest: Effort normal and breath sounds normal.  Abdominal: Soft. Bowel sounds are  normal. There is no tenderness.  Musculoskeletal: She exhibits no edema.  Pain with palpation noted in right shoulder, right shoulder blade and right side of neck.   Neurological: She is alert and oriented to person, place, and time.  Skin: Skin is warm and dry.  Psychiatric: She has a normal mood and affect.  Nursing note and vitals reviewed.   BP 132/78   Pulse (!) 54   Temp 98.2 F (36.8 C) (Oral)   Resp 18   Wt 195 lb 12.8 oz (88.8 kg)   SpO2 100%   BMI 29.77 kg/m  Wt Readings from Last 3 Encounters:  12/10/17 195 lb 12.8 oz (88.8 kg)  12/04/17 199 lb (90.3 kg)  10/22/17 197 lb 3.2 oz (89.4 kg)   BP Readings from Last 3 Encounters:  12/13/17 132/78  12/04/17 (!) 161/93  10/22/17 136/80     Immunization History  Administered Date(s) Administered  . Influenza, High Dose Seasonal PF 04/17/2016, 06/04/2017  . Influenza,inj,Quad PF,6+ Mos 04/11/2014, 05/23/2015    Health Maintenance  Topic Date Due  . Hepatitis C Screening  1945/12/26  . FOOT EXAM  09/04/1957  . PNA vac Low Risk Adult (1 of 2 - PCV13) 09/04/2012  . OPHTHALMOLOGY EXAM  08/23/2016  . INFLUENZA VACCINE  01/14/2018  . HEMOGLOBIN A1C  03/06/2018  . MAMMOGRAM  06/05/2019  . COLONOSCOPY  06/15/2020  . TETANUS/TDAP  06/15/2020  . DEXA SCAN  Addressed    Lab Results  Component Value Date   WBC 8.5 12/04/2017   HGB 11.8 (L) 12/04/2017   HCT 36.4  12/04/2017   PLT 277 12/04/2017   GLUCOSE 94 12/04/2017   CHOL 203 (H) 09/03/2017   TRIG 129.0 09/03/2017   HDL 54.60 09/03/2017   LDLDIRECT 127.0 04/17/2016   LDLCALC 123 (H) 09/03/2017   ALT 17 12/04/2017   AST 18 12/04/2017   NA 142 12/04/2017   K 3.4 (L) 12/04/2017   CL 103 12/04/2017   CREATININE 0.70 12/04/2017   BUN 18 12/04/2017   CO2 30 12/04/2017   TSH 2.27 09/03/2017   HGBA1C 6.7 (H) 09/03/2017    Lab Results  Component Value Date   TSH 2.27 09/03/2017   Lab Results  Component Value Date   WBC 8.5 12/04/2017   HGB 11.8 (L) 12/04/2017   HCT 36.4 12/04/2017   MCV 74.7 (L) 12/04/2017   PLT 277 12/04/2017   Lab Results  Component Value Date   NA 142 12/04/2017   K 3.4 (L) 12/04/2017   CO2 30 12/04/2017   GLUCOSE 94 12/04/2017   BUN 18 12/04/2017   CREATININE 0.70 12/04/2017   BILITOT 0.4 12/04/2017   ALKPHOS 52 12/04/2017   AST 18 12/04/2017   ALT 17 12/04/2017   PROT 7.9 12/04/2017   ALBUMIN 4.2 12/04/2017   CALCIUM 9.4 12/04/2017   ANIONGAP 9 12/04/2017   GFR 93.88 10/07/2017   Lab Results  Component Value Date   CHOL 203 (H) 09/03/2017   Lab Results  Component Value Date   HDL 54.60 09/03/2017   Lab Results  Component Value Date   LDLCALC 123 (H) 09/03/2017   Lab Results  Component Value Date   TRIG 129.0 09/03/2017   Lab Results  Component Value Date   CHOLHDL 4 09/03/2017   Lab Results  Component Value Date   HGBA1C 6.7 (H) 09/03/2017         Assessment & Plan:   Problem List Items Addressed This Visit    HTN (  hypertension)    Improved on recheck no changes      Hyperlipidemia    Encouraged heart healthy diet, increase exercise, avoid trans fats, consider a krill oil cap daily      Diabetes mellitus type 2 in obese (HCC)    hgba1c acceptable, minimize simple carbs. Increase exercise as tolerated. Continue current meds      Pain in the chest    And upper right back. Pain is at top of thoracic region on right  wraps from scapula to shoulder and to the anterior chest wall. Position changes can help and make it worse. Pain can be as good as a 4 and as high as a 9. She presented to ER for evaluation this week work up was negative for cardiac cause and her pain is suggestive of musculoskeletal cause. Referred to sports med and xrays ordered.        Other Visit Diagnoses    Neck pain    -  Primary   Relevant Orders   Ambulatory referral to Sports Medicine   DG Cervical Spine Complete (Completed)   Thoracic back pain, unspecified back pain laterality, unspecified chronicity       Relevant Medications   tiZANidine (ZANAFLEX) 4 MG tablet   Other Relevant Orders   Ambulatory referral to Sports Medicine   DG Thoracic Spine 2 View (Completed)      I have discontinued Charo Voeltz's cyclobenzaprine. I am also having her start on tiZANidine. Additionally, I am having her maintain her multivitamin, Vitamin B 12, aspirin, ONE TOUCH LANCETS, ONE TOUCH BASIC SYSTEM, glucose blood, glucose blood, benzonatate, simvastatin, atenolol, potassium chloride SA, hydrochlorothiazide, furosemide, amLODipine, ferrous sulfate, losartan, and diclofenac sodium.  Meds ordered this encounter  Medications  . tiZANidine (ZANAFLEX) 4 MG tablet    Sig: Take 1 tablet (4 mg total) by mouth every 8 (eight) hours as needed for muscle spasms.    Dispense:  60 tablet    Refill:  1    CMA served as scribe during this visit. History, Physical and Plan performed by medical provider. Documentation and orders reviewed and attested to.  Penni Homans, MD

## 2017-12-10 NOTE — Patient Instructions (Signed)
Tylenol?acetaminophen 500 mg tabs 2 tabs three x daily  apply Lidocaine gel 3 x daily as needed after applying heat (Aspercreme, Icy Hot and Salon Pas) Moist heat   Back Pain, Adult Back pain is very common. The pain often gets better over time. The cause of back pain is usually not dangerous. Most people can learn to manage their back pain on their own. Follow these instructions at home: Watch your back pain for any changes. The following actions may help to lessen any pain you are feeling:  Stay active. Start with short walks on flat ground if you can. Try to walk farther each day.  Exercise regularly as told by your doctor. Exercise helps your back heal faster. It also helps avoid future injury by keeping your muscles strong and flexible.  Do not sit, drive, or stand in one place for more than 30 minutes.  Do not stay in bed. Resting more than 1-2 days can slow down your recovery.  Be careful when you bend or lift an object. Use good form when lifting: ? Bend at your knees. ? Keep the object close to your body. ? Do not twist.  Sleep on a firm mattress. Lie on your side, and bend your knees. If you lie on your back, put a pillow under your knees.  Take medicines only as told by your doctor.  Put ice on the injured area. ? Put ice in a plastic bag. ? Place a towel between your skin and the bag. ? Leave the ice on for 20 minutes, 2-3 times a day for the first 2-3 days. After that, you can switch between ice and heat packs.  Avoid feeling anxious or stressed. Find good ways to deal with stress, such as exercise.  Maintain a healthy weight. Extra weight puts stress on your back.  Contact a doctor if:  You have pain that does not go away with rest or medicine.  You have worsening pain that goes down into your legs or buttocks.  You have pain that does not get better in one week.  You have pain at night.  You lose weight.  You have a fever or chills. Get help right away  if:  You cannot control when you poop (bowel movement) or pee (urinate).  Your arms or legs feel weak.  Your arms or legs lose feeling (numbness).  You feel sick to your stomach (nauseous) or throw up (vomit).  You have belly (abdominal) pain.  You feel like you may pass out (faint). This information is not intended to replace advice given to you by your health care provider. Make sure you discuss any questions you have with your health care provider. Document Released: 11/19/2007 Document Revised: 11/08/2015 Document Reviewed: 10/04/2013 Elsevier Interactive Patient Education  [employer].

## 2017-12-13 NOTE — Assessment & Plan Note (Signed)
And upper right back. Pain is at top of thoracic region on right wraps from scapula to shoulder and to the anterior chest wall. Position changes can help and make it worse. Pain can be as good as a 4 and as high as a 9. She presented to ER for evaluation this week work up was negative for cardiac cause and her pain is suggestive of musculoskeletal cause. Referred to sports med and xrays ordered.

## 2017-12-13 NOTE — Assessment & Plan Note (Signed)
Encouraged heart healthy diet, increase exercise, avoid trans fats, consider a krill oil cap daily 

## 2017-12-13 NOTE — Assessment & Plan Note (Signed)
Improved on recheck no changes

## 2017-12-13 NOTE — Assessment & Plan Note (Signed)
hgba1c acceptable, minimize simple carbs. Increase exercise as tolerated. Continue current meds 

## 2017-12-14 ENCOUNTER — Inpatient Hospital Stay: Payer: Medicare Other | Admitting: Family Medicine

## 2017-12-18 ENCOUNTER — Telehealth: Payer: Self-pay

## 2017-12-18 NOTE — Telephone Encounter (Signed)
Copied from [place] [phone]. Topic: Inquiry >> Dec 08, 2017  3:17 PM Pricilla Handler wrote: Reason for CRM: Patient called wanting to know if Dr. Charlett Blake can prescribe her something for her Right Shoulder and Right Side Chest Pain. Patient was seen at in the [facility] ED on Friday. Please call patient at [phone].       Thank You!!!   Please advise

## 2017-12-19 ENCOUNTER — Other Ambulatory Visit: Payer: Self-pay | Admitting: Family Medicine

## 2017-12-19 NOTE — Telephone Encounter (Signed)
If her pain is persistent she probably has to be seen. She can start Tylenol ES 500 mg tabs, 2 tabs po tid that might hlp and if the Flexeril the ER gave her was more helpful than the Tizanidine we gave her we can refill that as well. If she come back in we can figure out if we need to check a CT scan or refer to orthopaedics for further evaluation.

## 2017-12-20 ENCOUNTER — Other Ambulatory Visit: Payer: Self-pay | Admitting: Family Medicine

## 2017-12-22 ENCOUNTER — Encounter: Payer: Self-pay | Admitting: Family Medicine

## 2017-12-22 ENCOUNTER — Ambulatory Visit: Payer: Medicare Other | Admitting: Family Medicine

## 2017-12-22 DIAGNOSIS — M542 Cervicalgia: Secondary | ICD-10-CM

## 2017-12-22 NOTE — Telephone Encounter (Signed)
Called patient left message for patient to call the office back  

## 2017-12-22 NOTE — Patient Instructions (Signed)
You have cervical radiculopathy (a pinched nerve in the neck) from arthritis. Aleve 2 tabs twice a day with food for pain and inflammation. Zanaflex as needed for muscle spasms (do not drive with this if it makes you sleepy). Consider cervical collar if severely painful. Simple range of motion exercises within limits of pain to prevent further stiffness. Consider physical therapy for stretching, exercises, traction, and modalities. Heat 15 minutes at a time 3-4 times a day to help with spasms. Watch head position when on computers, texting, when sleeping in bed - should in line with back to prevent further nerve traction and irritation. Follow up with me in 1 month but call me sooner if you're struggling and want to try physical therapy or prednisone.

## 2017-12-23 ENCOUNTER — Encounter: Payer: Self-pay | Admitting: Family Medicine

## 2017-12-23 DIAGNOSIS — M542 Cervicalgia: Secondary | ICD-10-CM | POA: Insufficient documentation

## 2017-12-23 NOTE — Progress Notes (Signed)
PCP: Mosie Lukes, MD  Subjective:   HPI: Patient is a 72 y.o. female here for neck, upper back pain.  Patient reports for about 3 weeks she's had pain in upper part of back and lower neck. Pain radiates from here down to her right elbow and is sharp. Currently only at 3/10 level of pain. No history of trauma. Went to emergency department due to severity of the pain and given flexeril. She's tried zanaflex from her PCP which she thinks helps better. Taking tylenol as needed. No numbness. No bowel/bladder dysfunction.  Past Medical History:  Diagnosis Date  . Anemia   . Cerebral aneurysm    h/o in 2006  . Dacrocystitis 07/19/2015   right  . Diabetes mellitus type 2 in obese ([place]) 11/05/2014  . GERD (gastroesophageal reflux disease) 04/17/2016  . Hyperlipidemia   . Hypertension   . Osteoarthritis of left hip 12/02/2015  . Osteopenia 11/22/2015  . Overweight     Current Outpatient Medications on File Prior to Visit  Medication Sig Dispense Refill  . amLODipine (NORVASC) 5 MG tablet TAKE 1 TABLET BY MOUTH EVERY DAY 30 tablet 5  . aspirin 81 MG tablet Takes every other day    . atenolol (TENORMIN) 50 MG tablet TAKE 1 TABLET BY MOUTH EVERY DAY 30 tablet 3  . benzonatate (TESSALON) 100 MG capsule Take 1 capsule (100 mg total) by mouth 3 (three) times daily as needed for cough. 30 capsule 1  . Blood Glucose Monitoring Suppl ([place]) W/DEVICE KIT Use to check blood sugar. DX. E11.9 1 each 0  . Cyanocobalamin (VITAMIN B 12) 250 MCG LOZG Take by mouth.    . diclofenac sodium (VOLTAREN) 1 % GEL Apply 4 g topically 4 (four) times daily. 100 g 0  . ferrous sulfate 325 (65 FE) MG tablet TAKE 1 TABLET BY MOUTH EVERY DAY WITH BREAKFAST 90 tablet 1  . furosemide (LASIX) 20 MG tablet TAKE 2 TABLETS (40 MG TOTAL) EVERY MORNING BY MOUTH. 60 tablet 1  . glucose blood (ONE TOUCH ULTRA TEST) test strip Use as idirected once daily.  DX E11.9 100 each 6  . glucose blood test strip Test  once daily to check blood sugar.  DX E11.9 100 each 11  . hydrochlorothiazide (HYDRODIURIL) 25 MG tablet Take 1 tablet (25 mg total) by mouth daily. 90 tablet 3  . losartan (COZAAR) 100 MG tablet TAKE 1 TABLET (100 MG TOTAL) EVERY MORNING BY MOUTH. 90 tablet 1  . Multiple Vitamin (MULTIVITAMIN) capsule Take 1 capsule by mouth daily.    . ONE TOUCH LANCETS MISC Use as directed once daily to check blood sugar. DX: E11.9 100 each 0  . potassium chloride SA (KLOR-CON M20) 20 MEQ tablet Take 1 tablet (20 mEq total) by mouth daily. 30 tablet 3  . simvastatin (ZOCOR) 20 MG tablet Take 1 tablet (20 mg total) by mouth daily. 30 tablet 1  . tiZANidine (ZANAFLEX) 4 MG tablet Take 1 tablet (4 mg total) by mouth every 8 (eight) hours as needed for muscle spasms. 60 tablet 1   No current facility-administered medications on file prior to visit.     Past Surgical History:  Procedure Laterality Date  . ABDOMINAL HYSTERECTOMY    . CEREBRAL ANEURYSM REPAIR      No Known Allergies  Social History   Socioeconomic History  . Marital status: Widowed    Spouse name: Not on file  . Number of children: Not on file  .  Years of education: Not on file  . Highest education level: Not on file  Occupational History  . Not on file  Social Needs  . Financial resource strain: Not on file  . Food insecurity:    Worry: Not on file    Inability: Not on file  . Transportation needs:    Medical: Not on file    Non-medical: Not on file  Tobacco Use  . Smoking status: Never Smoker  . Smokeless tobacco: Never Used  Substance and Sexual Activity  . Alcohol use: No  . Drug use: No  . Sexual activity: Never    Comment: lives with self, no dietary restrictions. works as a Theme park manager, retired from state employment  Lifestyle  . Physical activity:    Days per week: Not on file    Minutes per session: Not on file  . Stress: Not on file  Relationships  . Social connections:    Talks on phone: Not on file    Gets  together: Not on file    Attends religious service: Not on file    Active member of club or organization: Not on file    Attends meetings of clubs or organizations: Not on file    Relationship status: Not on file  . Intimate partner violence:    Fear of current or ex partner: Not on file    Emotionally abused: Not on file    Physically abused: Not on file    Forced sexual activity: Not on file  Other Topics Concern  . Not on file  Social History Narrative  . Not on file    Family History  Problem Relation Age of Onset  . Cerebral aneurysm Father   . Alzheimer's disease Mother   . Cancer Maternal Aunt        breast  . Diabetes Maternal Grandmother   . Stroke Maternal Grandfather   . Alzheimer's disease Paternal Grandmother   . Stroke Paternal Grandfather     BP 137/81   Pulse 76   Ht '5\' 8"'  (1.727 m)   Wt 195 lb (88.5 kg)   BMI 29.65 kg/m   Review of Systems: See HPI above.     Objective:  Physical Exam:  Gen: NAD, comfortable in exam room  Neck: No gross deformity, swelling, bruising. TTP upper thoracic, right cervical paraspinal region.  No midline/bony TTP. FROM. BUE strength 5/5.   Sensation intact to light touch.   2+ equal reflexes in triceps, biceps, brachioradialis tendons. Negative spurlings. NV intact distal BUEs.  Right shoulder: No swelling, ecchymoses.  No gross deformity. No TTP. FROM. Strength 5/5 with empty can and resisted internal/external rotation. NV intact distally.   Assessment & Plan:  1. Neck/upper back pain - with radiation into right arm though exam is reassuring.  Consistent with low cervical radiculopathy.  She declined physical therapy for now.  Aleve twice a day.  Zanaflex as needed.  Heat.  Ergonomic issues discussed.  F/u in 1 month.  Consider physical therapy, prednisone dose pack if not improving.

## 2017-12-23 NOTE — Assessment & Plan Note (Signed)
with radiation into right arm though exam is reassuring.  Consistent with low cervical radiculopathy.  She declined physical therapy for now.  Aleve twice a day.  Zanaflex as needed.  Heat.  Ergonomic issues discussed.  F/u in 1 month.  Consider physical therapy, prednisone dose pack if not improving.

## 2018-01-12 ENCOUNTER — Encounter: Payer: Self-pay | Admitting: Family Medicine

## 2018-01-26 ENCOUNTER — Encounter: Payer: Self-pay | Admitting: Family Medicine

## 2018-01-26 ENCOUNTER — Ambulatory Visit: Payer: Medicare Other | Admitting: Family Medicine

## 2018-01-26 VITALS — BP 108/71 | HR 64 | Ht 68.0 in | Wt 195.0 lb

## 2018-01-26 DIAGNOSIS — M542 Cervicalgia: Secondary | ICD-10-CM | POA: Diagnosis not present

## 2018-01-26 NOTE — Progress Notes (Signed)
PCP: Mosie Lukes, MD  Subjective:   HPI: Patient is a 72 y.o. female here for neck, upper back pain.  7/9: Patient reports for about 3 weeks she's had pain in upper part of back and lower neck. Pain radiates from here down to her right elbow and is sharp. Currently only at 3/10 level of pain. No history of trauma. Went to emergency department due to severity of the pain and given flexeril. She's tried zanaflex from her PCP which she thinks helps better. Taking tylenol as needed. No numbness. No bowel/bladder dysfunction.  8/13: Patient reports she's doing well. Had some pain into left side of neck, posterior shoulder but this improved. Pain currently 0/10, has some soreness. Taken aleve as needed (took last week). Used topical cream, heating pad, and muscle relaxant at night. No radiation. No numbness.  Past Medical History:  Diagnosis Date  . Anemia   . Cerebral aneurysm    h/o in 2006  . Dacrocystitis 07/19/2015   right  . Diabetes mellitus type 2 in obese ([place]) 11/05/2014  . GERD (gastroesophageal reflux disease) 04/17/2016  . Hyperlipidemia   . Hypertension   . Osteoarthritis of left hip 12/02/2015  . Osteopenia 11/22/2015  . Overweight     Current Outpatient Medications on File Prior to Visit  Medication Sig Dispense Refill  . amLODipine (NORVASC) 5 MG tablet TAKE 1 TABLET BY MOUTH EVERY DAY 30 tablet 5  . aspirin 81 MG tablet Takes every other day    . atenolol (TENORMIN) 50 MG tablet TAKE 1 TABLET BY MOUTH EVERY DAY 30 tablet 3  . benzonatate (TESSALON) 100 MG capsule Take 1 capsule (100 mg total) by mouth 3 (three) times daily as needed for cough. 30 capsule 1  . Blood Glucose Monitoring Suppl ([place]) W/DEVICE KIT Use to check blood sugar. DX. E11.9 1 each 0  . Cyanocobalamin (VITAMIN B 12) 250 MCG LOZG Take by mouth.    . diclofenac sodium (VOLTAREN) 1 % GEL Apply 4 g topically 4 (four) times daily. 100 g 0  . ferrous sulfate 325 (65 FE) MG  tablet TAKE 1 TABLET BY MOUTH EVERY DAY WITH BREAKFAST 90 tablet 1  . furosemide (LASIX) 20 MG tablet TAKE 2 TABLETS (40 MG TOTAL) EVERY MORNING BY MOUTH. 60 tablet 1  . glucose blood (ONE TOUCH ULTRA TEST) test strip Use as idirected once daily.  DX E11.9 100 each 6  . glucose blood test strip Test once daily to check blood sugar.  DX E11.9 100 each 11  . hydrochlorothiazide (HYDRODIURIL) 25 MG tablet Take 1 tablet (25 mg total) by mouth daily. 90 tablet 3  . losartan (COZAAR) 100 MG tablet TAKE 1 TABLET (100 MG TOTAL) EVERY MORNING BY MOUTH. 90 tablet 1  . Multiple Vitamin (MULTIVITAMIN) capsule Take 1 capsule by mouth daily.    . ONE TOUCH LANCETS MISC Use as directed once daily to check blood sugar. DX: E11.9 100 each 0  . potassium chloride SA (KLOR-CON M20) 20 MEQ tablet Take 1 tablet (20 mEq total) by mouth daily. 30 tablet 3  . RESTASIS 0.05 % ophthalmic emulsion INSTILL 1 DROP INTO BOTH EYES TWICE A DAY  3  . simvastatin (ZOCOR) 20 MG tablet Take 1 tablet (20 mg total) by mouth daily. 30 tablet 1  . tiZANidine (ZANAFLEX) 4 MG tablet Take 1 tablet (4 mg total) by mouth every 8 (eight) hours as needed for muscle spasms. 60 tablet 1   No current  facility-administered medications on file prior to visit.     Past Surgical History:  Procedure Laterality Date  . ABDOMINAL HYSTERECTOMY    . CEREBRAL ANEURYSM REPAIR      No Known Allergies  Social History   Socioeconomic History  . Marital status: Widowed    Spouse name: Not on file  . Number of children: Not on file  . Years of education: Not on file  . Highest education level: Not on file  Occupational History  . Not on file  Social Needs  . Financial resource strain: Not on file  . Food insecurity:    Worry: Not on file    Inability: Not on file  . Transportation needs:    Medical: Not on file    Non-medical: Not on file  Tobacco Use  . Smoking status: Never Smoker  . Smokeless tobacco: Never Used  Substance and Sexual  Activity  . Alcohol use: No  . Drug use: No  . Sexual activity: Never    Comment: lives with self, no dietary restrictions. works as a Theme park manager, retired from state employment  Lifestyle  . Physical activity:    Days per week: Not on file    Minutes per session: Not on file  . Stress: Not on file  Relationships  . Social connections:    Talks on phone: Not on file    Gets together: Not on file    Attends religious service: Not on file    Active member of club or organization: Not on file    Attends meetings of clubs or organizations: Not on file    Relationship status: Not on file  . Intimate partner violence:    Fear of current or ex partner: Not on file    Emotionally abused: Not on file    Physically abused: Not on file    Forced sexual activity: Not on file  Other Topics Concern  . Not on file  Social History Narrative  . Not on file    Family History  Problem Relation Age of Onset  . Cerebral aneurysm Father   . Alzheimer's disease Mother   . Cancer Maternal Aunt        breast  . Diabetes Maternal Grandmother   . Stroke Maternal Grandfather   . Alzheimer's disease Paternal Grandmother   . Stroke Paternal Grandfather     BP 108/71   Pulse 64   Ht '5\' 8"'  (1.727 m)   Wt 195 lb (88.5 kg)   BMI 29.65 kg/m   Review of Systems: See HPI above.     Objective:  Physical Exam:  Gen: NAD, comfortable in exam room  Neck: No gross deformity, swelling, bruising. TTP mildly right trapezius.  No midline/bony TTP. FROM without pain. BUE strength 5/5.   Sensation intact to light touch.   NV intact distal BUEs.   Assessment & Plan:  1. Neck/upper back pain - improved from low cervical radiculopathy.  Heat, aleve, topical medications, tizanidine as needed.  Encouraged motion exercises.  Consider PT if not continuing to improve.  F/u prn otherwise.

## 2018-01-26 NOTE — Patient Instructions (Signed)
You're doing great! Use heat 15 minutes at a time as needed. Aleve, topical medications if needed. Rarely use the muscle relaxant tizanidine if needed. Follow up with me as needed.

## 2018-02-02 ENCOUNTER — Ambulatory Visit (INDEPENDENT_AMBULATORY_CARE_PROVIDER_SITE_OTHER): Payer: Medicare Other | Admitting: Family Medicine

## 2018-02-02 ENCOUNTER — Encounter: Payer: Self-pay | Admitting: Family Medicine

## 2018-02-02 VITALS — BP 120/66 | HR 53 | Temp 98.0°F | Resp 18 | Ht 68.0 in | Wt 195.6 lb

## 2018-02-02 DIAGNOSIS — E782 Mixed hyperlipidemia: Secondary | ICD-10-CM | POA: Diagnosis not present

## 2018-02-02 DIAGNOSIS — Z7289 Other problems related to lifestyle: Secondary | ICD-10-CM

## 2018-02-02 DIAGNOSIS — Z0000 Encounter for general adult medical examination without abnormal findings: Secondary | ICD-10-CM | POA: Diagnosis not present

## 2018-02-02 DIAGNOSIS — M8580 Other specified disorders of bone density and structure, unspecified site: Secondary | ICD-10-CM

## 2018-02-02 DIAGNOSIS — E669 Obesity, unspecified: Secondary | ICD-10-CM | POA: Diagnosis not present

## 2018-02-02 DIAGNOSIS — E1169 Type 2 diabetes mellitus with other specified complication: Secondary | ICD-10-CM

## 2018-02-02 DIAGNOSIS — G809 Cerebral palsy, unspecified: Secondary | ICD-10-CM | POA: Diagnosis not present

## 2018-02-02 DIAGNOSIS — E559 Vitamin D deficiency, unspecified: Secondary | ICD-10-CM | POA: Insufficient documentation

## 2018-02-02 DIAGNOSIS — I10 Essential (primary) hypertension: Secondary | ICD-10-CM

## 2018-02-02 DIAGNOSIS — H269 Unspecified cataract: Secondary | ICD-10-CM | POA: Insufficient documentation

## 2018-02-02 DIAGNOSIS — I671 Cerebral aneurysm, nonruptured: Secondary | ICD-10-CM

## 2018-02-02 DIAGNOSIS — H04123 Dry eye syndrome of bilateral lacrimal glands: Secondary | ICD-10-CM | POA: Insufficient documentation

## 2018-02-02 LAB — COMPREHENSIVE METABOLIC PANEL
ALT: 13 U/L (ref 0–35)
AST: 14 U/L (ref 0–37)
Albumin: 4.4 g/dL (ref 3.5–5.2)
Alkaline Phosphatase: 50 U/L (ref 39–117)
BUN: 19 mg/dL (ref 6–23)
CO2: 38 mEq/L — ABNORMAL HIGH (ref 19–32)
Calcium: 10.5 mg/dL (ref 8.4–10.5)
Chloride: 98 mEq/L (ref 96–112)
Creatinine, Ser: 1 mg/dL (ref 0.40–1.20)
GFR: 70.41 mL/min (ref >60.00)
Glucose, Bld: 115 mg/dL — ABNORMAL HIGH (ref 70–99)
Potassium: 3.9 mEq/L (ref 3.5–5.1)
Sodium: 143 mEq/L (ref 135–145)
Total Bilirubin: 0.4 mg/dL (ref 0.2–1.2)
Total Protein: 7.5 g/dL (ref 6.0–8.3)

## 2018-02-02 LAB — CBC
HCT: 36.2 % (ref 36.0–46.0)
Hemoglobin: 11.4 g/dL — ABNORMAL LOW (ref 12.0–15.0)
MCHC: 31.4 g/dL (ref 30.0–36.0)
MCV: 75.1 fl — ABNORMAL LOW (ref 78.0–100.0)
Platelets: 279 10*3/uL (ref 150.0–400.0)
RBC: 4.83 Mil/uL (ref 3.87–5.11)
RDW: 13.6 % (ref 11.5–15.5)
WBC: 8 10*3/uL (ref 4.0–10.5)

## 2018-02-02 LAB — LIPID PANEL
Cholesterol: 220 mg/dL — ABNORMAL HIGH (ref 0–200)
HDL: 53.3 mg/dL (ref >39.00)
LDL Cholesterol: 138 mg/dL — ABNORMAL HIGH (ref 0–99)
NonHDL: 166.38
Total CHOL/HDL Ratio: 4
Triglycerides: 142 mg/dL (ref 0.0–149.0)
VLDL: 28.4 mg/dL (ref 0.0–40.0)

## 2018-02-02 LAB — HEMOGLOBIN A1C: Hgb A1c MFr Bld: 7.1 % — ABNORMAL HIGH (ref 4.6–6.5)

## 2018-02-02 LAB — VITAMIN D 25 HYDROXY (VIT D DEFICIENCY, FRACTURES): VITD: 44.2 ng/mL (ref 30.00–100.00)

## 2018-02-02 LAB — TSH: TSH: 1.92 u[IU]/mL (ref 0.35–4.50)

## 2018-02-02 MED ORDER — POTASSIUM CHLORIDE CRYS ER 20 MEQ PO TBCR: 20.0000 meq | EXTENDED_RELEASE_TABLET | Freq: Every day | ORAL | 3 refills | Status: DC

## 2018-02-02 NOTE — Progress Notes (Signed)
Subjective:  I acted as a Education administrator for Dr. Charlett Blake. [place], [place]  Patient ID: Morgan Medina, female    DOB: 1945-10-02, 72 y.o.   MRN: [id number]  No chief complaint on file.   HPI  Patient is in today for an annual exam and follow up on chronic medical concerns including a cerebral aneurysm s/p coiling, diabetes, arthritis and hypertension. She has been following with Sports medicine for her neck and shoulder pain and that has been helpful. She notes some recent episodes o a stange sensation over the ight side of her face which is transitory. No injury or fall. No polyuria or polydipsia. She has been trying to maintain a heart healthy diet and to stay active. She is undergoing a great deal of stress but she is managing well. Denies CP/palp/SOB/HA/congestion/fevers/GI or GU c/o. Taking meds as prescribed  Patient Care Team: Mosie Lukes, MD as PCP - General (Family Medicine) Rolley Sims, [place] as Consulting Physician (Optometry)   Past Medical History:  Diagnosis Date  . Anemia   . Cerebral aneurysm    h/o in 2006  . Dacrocystitis 07/19/2015   right  . Diabetes mellitus type 2 in obese ([facility]) 11/05/2014  . GERD (gastroesophageal reflux disease) 04/17/2016  . Hyperlipidemia   . Hypertension   . Osteoarthritis of left hip 12/02/2015  . Osteopenia 11/22/2015  . Overweight     Past Surgical History:  Procedure Laterality Date  . ABDOMINAL HYSTERECTOMY    . CEREBRAL ANEURYSM REPAIR      Family History  Problem Relation Age of Onset  . Cerebral aneurysm Father   . Alzheimer's disease Mother   . Cancer Maternal Aunt        breast  . Diabetes Maternal Grandmother   . Stroke Maternal Grandfather   . Alzheimer's disease Paternal Grandmother   . Stroke Paternal Grandfather     Social History   Socioeconomic History  . Marital status: Widowed    Spouse name: Not on file  . Number of children: Not on file  . Years of education: Not on file  . Highest education level: Not on file    Occupational History  . Not on file  Social Needs  . Financial resource strain: Not on file  . Food insecurity:    Worry: Not on file    Inability: Not on file  . Transportation needs:    Medical: Not on file    Non-medical: Not on file  Tobacco Use  . Smoking status: Never Smoker  . Smokeless tobacco: Never Used  Substance and Sexual Activity  . Alcohol use: No  . Drug use: No  . Sexual activity: Never    Comment: lives with self, no dietary restrictions. works as a Theme park manager, retired from state employment  Lifestyle  . Physical activity:    Days per week: Not on file    Minutes per session: Not on file  . Stress: Not on file  Relationships  . Social connections:    Talks on phone: Not on file    Gets together: Not on file    Attends religious service: Not on file    Active member of club or organization: Not on file    Attends meetings of clubs or organizations: Not on file    Relationship status: Not on file  . Intimate partner violence:    Fear of current or ex partner: Not on file    Emotionally abused: Not on file    Physically abused:  Not on file    Forced sexual activity: Not on file  Other Topics Concern  . Not on file  Social History Narrative  . Not on file    Outpatient Medications Prior to Visit  Medication Sig Dispense Refill  . amLODipine (NORVASC) 5 MG tablet TAKE 1 TABLET BY MOUTH EVERY DAY 30 tablet 5  . aspirin 81 MG tablet Takes every other day    . atenolol (TENORMIN) 50 MG tablet TAKE 1 TABLET BY MOUTH EVERY DAY 30 tablet 3  . Blood Glucose Monitoring Suppl ([place]) W/DEVICE KIT Use to check blood sugar. DX. E11.9 1 each 0  . Cyanocobalamin (VITAMIN B 12) 250 MCG LOZG Take by mouth.    . ferrous sulfate 325 (65 FE) MG tablet TAKE 1 TABLET BY MOUTH EVERY DAY WITH BREAKFAST 90 tablet 1  . furosemide (LASIX) 20 MG tablet TAKE 2 TABLETS (40 MG TOTAL) EVERY MORNING BY MOUTH. 60 tablet 1  . glucose blood (ONE TOUCH ULTRA TEST) test  strip Use as idirected once daily.  DX E11.9 100 each 6  . glucose blood test strip Test once daily to check blood sugar.  DX E11.9 100 each 11  . hydrochlorothiazide (HYDRODIURIL) 25 MG tablet Take 1 tablet (25 mg total) by mouth daily. 90 tablet 3  . losartan (COZAAR) 100 MG tablet TAKE 1 TABLET (100 MG TOTAL) EVERY MORNING BY MOUTH. 90 tablet 1  . Multiple Vitamin (MULTIVITAMIN) capsule Take 1 capsule by mouth daily.    . ONE TOUCH LANCETS MISC Use as directed once daily to check blood sugar. DX: E11.9 100 each 0  . RESTASIS 0.05 % ophthalmic emulsion INSTILL 1 DROP INTO BOTH EYES TWICE A DAY  3  . benzonatate (TESSALON) 100 MG capsule Take 1 capsule (100 mg total) by mouth 3 (three) times daily as needed for cough. 30 capsule 1  . diclofenac sodium (VOLTAREN) 1 % GEL Apply 4 g topically 4 (four) times daily. 100 g 0  . potassium chloride SA (KLOR-CON M20) 20 MEQ tablet Take 1 tablet (20 mEq total) by mouth daily. 30 tablet 3  . simvastatin (ZOCOR) 20 MG tablet Take 1 tablet (20 mg total) by mouth daily. 30 tablet 1  . tiZANidine (ZANAFLEX) 4 MG tablet Take 1 tablet (4 mg total) by mouth every 8 (eight) hours as needed for muscle spasms. 60 tablet 1  . cycloSPORINE (RESTASIS) 0.05 % ophthalmic emulsion Place 1 drop into both eyes 2 (two) times daily. 0.4 mL    No facility-administered medications prior to visit.     No Known Allergies  Review of Systems  Constitutional: Negative for chills, fever and malaise/fatigue.  HENT: Negative for congestion and hearing loss.   Eyes: Negative for discharge.  Respiratory: Negative for cough, sputum production and shortness of breath.   Cardiovascular: Negative for chest pain, palpitations and leg swelling.  Gastrointestinal: Negative for abdominal pain, blood in stool, constipation, diarrhea, heartburn, nausea and vomiting.  Genitourinary: Negative for dysuria, frequency, hematuria and urgency.  Musculoskeletal: Positive for joint pain and neck  pain. Negative for back pain, falls and myalgias.  Skin: Negative for rash.  Neurological: Positive for sensory change. Negative for dizziness, loss of consciousness, weakness and headaches.  Endo/Heme/Allergies: Negative for environmental allergies. Does not bruise/bleed easily.  Psychiatric/Behavioral: Negative for depression and suicidal ideas. The patient is not nervous/anxious and does not have insomnia.        Objective:    Physical Exam  Constitutional: She  is oriented to person, place, and time. No distress.  HENT:  Head: Normocephalic and atraumatic.  Right Ear: External ear normal.  Left Ear: External ear normal.  Nose: Nose normal.  Mouth/Throat: Oropharynx is clear and moist. No oropharyngeal exudate.  Eyes: Pupils are equal, round, and reactive to light. Conjunctivae are normal. Right eye exhibits no discharge. Left eye exhibits no discharge. No scleral icterus.  Neck: Normal range of motion. Neck supple. No thyromegaly present.  Cardiovascular: Normal rate, regular rhythm, normal heart sounds and intact distal pulses.  No murmur heard. Pulmonary/Chest: Effort normal and breath sounds normal. No respiratory distress. She has no wheezes. She has no rales.  Abdominal: Soft. Bowel sounds are normal. She exhibits no distension and no mass. There is no tenderness.  Musculoskeletal: Normal range of motion. She exhibits no edema or tenderness.  Lymphadenopathy:    She has no cervical adenopathy.  Neurological: She is alert and oriented to person, place, and time. She has normal reflexes. She displays normal reflexes. No cranial nerve deficit. Coordination normal.  Skin: Skin is warm and dry. No rash noted. She is not diaphoretic.    BP 120/66 (BP Location: Left Arm, Patient Position: Sitting, Cuff Size: Normal)   Pulse (!) 53   Temp 98 F (36.7 C) (Oral)   Resp 18   Ht '5\' 8"'$  (1.727 m)   Wt 195 lb 9.6 oz (88.7 kg)   SpO2 97%   BMI 29.74 kg/m  Wt Readings from Last 3  Encounters:  02/02/18 195 lb 9.6 oz (88.7 kg)  01/26/18 195 lb (88.5 kg)  12/22/17 195 lb (88.5 kg)   BP Readings from Last 3 Encounters:  02/02/18 120/66  01/26/18 108/71  12/22/17 137/81     Immunization History  Administered Date(s) Administered  . Influenza, High Dose Seasonal PF 04/17/2016, 06/04/2017  . Influenza,inj,Quad PF,6+ Mos 04/11/2014, 05/23/2015    Health Maintenance  Topic Date Due  . FOOT EXAM  09/04/1957  . PNA vac Low Risk Adult (1 of 2 - PCV13) 09/04/2012  . OPHTHALMOLOGY EXAM  08/23/2016  . INFLUENZA VACCINE  01/14/2018  . HEMOGLOBIN A1C  08/05/2018  . MAMMOGRAM  06/05/2019  . COLONOSCOPY  06/15/2020  . TETANUS/TDAP  06/15/2020  . Hepatitis C Screening  Completed  . DEXA SCAN  Addressed    Lab Results  Component Value Date   WBC 8.0 02/02/2018   HGB 11.4 (L) 02/02/2018   HCT 36.2 02/02/2018   PLT 279.0 02/02/2018   GLUCOSE 115 (H) 02/02/2018   CHOL 220 (H) 02/02/2018   TRIG 142.0 02/02/2018   HDL 53.30 02/02/2018   LDLDIRECT 127.0 04/17/2016   LDLCALC 138 (H) 02/02/2018   ALT 13 02/02/2018   AST 14 02/02/2018   NA 143 02/02/2018   K 3.9 02/02/2018   CL 98 02/02/2018   CREATININE 1.00 02/02/2018   BUN 19 02/02/2018   CO2 38 (H) 02/02/2018   TSH 1.92 02/02/2018   HGBA1C 7.1 (H) 02/02/2018    Lab Results  Component Value Date   TSH 1.92 02/02/2018   Lab Results  Component Value Date   WBC 8.0 02/02/2018   HGB 11.4 (L) 02/02/2018   HCT 36.2 02/02/2018   MCV 75.1 (L) 02/02/2018   PLT 279.0 02/02/2018   Lab Results  Component Value Date   NA 143 02/02/2018   K 3.9 02/02/2018   CO2 38 (H) 02/02/2018   GLUCOSE 115 (H) 02/02/2018   BUN 19 02/02/2018   CREATININE 1.00 02/02/2018  BILITOT 0.4 02/02/2018   ALKPHOS 50 02/02/2018   AST 14 02/02/2018   ALT 13 02/02/2018   PROT 7.5 02/02/2018   ALBUMIN 4.4 02/02/2018   CALCIUM 10.5 02/02/2018   ANIONGAP 9 12/04/2017   GFR 70.41 02/02/2018   Lab Results  Component Value Date    CHOL 220 (H) 02/02/2018   Lab Results  Component Value Date   HDL 53.30 02/02/2018   Lab Results  Component Value Date   LDLCALC 138 (H) 02/02/2018   Lab Results  Component Value Date   TRIG 142.0 02/02/2018   Lab Results  Component Value Date   CHOLHDL 4 02/02/2018   Lab Results  Component Value Date   HGBA1C 7.1 (H) 02/02/2018         Assessment & Plan:   Problem List Items Addressed This Visit    HTN (hypertension)    Well controlled, no changes to meds. Encouraged heart healthy diet such as the DASH diet and exercise as tolerated.       Hyperlipidemia    Well controlled, no changes to meds. Encouraged heart healthy diet such as the DASH diet and exercise as tolerated.       Relevant Orders   Lipid panel (Completed)   Cerebral aneurysm  S/P coiling  [place]  2006      Has not had any symptoms til recently and notes some tingling in the right side of face at times. Will proceed with MRA completed MRA shows no new concerns.       Relevant Orders   MR Angiogram Head Wo Contrast (Completed)   Diabetes mellitus type 2 in obese ([facility])    hgba1c acceptable, minimize simple carbs. Increase exercise as tolerated. Records requested form opthamology whom she saw last month. Foot exam performed at exam      Relevant Orders   Hemoglobin A1c (Completed)   Comprehensive metabolic panel (Completed)   TSH (Completed)   Preventative health care    Patient encouraged to maintain heart healthy diet, regular exercise, adequate sleep. Consider daily probiotics. Take medications as prescribed. Encouraged to take both pneumonia shots and shingrix and to take Tdap if injured. Last pap 2016 she declines today but will consider in future. MGM due in December 2019      Relevant Orders   CBC (Completed)   Osteopenia    Bone density shows osteopenia, which is thinner than normal but not as bad as osteoporosis. Recommend calcium intake of 1200 to 1500 mg daily, divided into roughly 3  doses. Best source is the diet and a single dairy serving is about 500 mg, a supplement of calcium citrate once or twice daily to balance diet is fine if not getting enough in diet. Also need Vitamin D 2000 IU caps, 1 cap daily if not already taking vitamin D. Also recommend weight baring exercise on hips and upper body to keep bones strong      Dry eyes, bilateral    Follows with opthamology and has been placed on eye drops to manage with Restasis      Cataract    Follows with Dr Orvan Seen      Vitamin D deficiency    Supplement and monitor      Relevant Orders   VITAMIN D 25 Hydroxy (Vit-D Deficiency, Fractures) (Completed)    Other Visit Diagnoses    Other problems related to lifestyle    -  Primary   Relevant Orders   Hepatitis C antibody (Completed)   Cerebral  palsy, unspecified type ([facility])          I have discontinued Gia Mankin's benzonatate, diclofenac sodium, and tiZANidine. I am also having her maintain her multivitamin, Vitamin B 12, aspirin, ONE TOUCH LANCETS, ONE TOUCH BASIC SYSTEM, glucose blood, glucose blood, hydrochlorothiazide, amLODipine, ferrous sulfate, losartan, atenolol, furosemide, RESTASIS, potassium chloride SA, and cycloSPORINE.  Meds ordered this encounter  Medications  . potassium chloride SA (KLOR-CON M20) 20 MEQ tablet    Sig: Take 1 tablet (20 mEq total) by mouth daily.    Dispense:  30 tablet    Refill:  3    CMA served as scribe during this visit. History, Physical and Plan performed by medical provider. Documentation and orders reviewed and attested to.  Penni Homans, MD

## 2018-02-02 NOTE — Assessment & Plan Note (Signed)
Supplement and monitor 

## 2018-02-02 NOTE — Assessment & Plan Note (Signed)
Follows with opthamology and has been placed on eye drops to manage with Restasis

## 2018-02-02 NOTE — Assessment & Plan Note (Addendum)
Has not had any symptoms til recently and notes some tingling in the right side of face at times. Will proceed with MRA completed MRA shows no new concerns.

## 2018-02-02 NOTE — Assessment & Plan Note (Addendum)
Patient encouraged to maintain heart healthy diet, regular exercise, adequate sleep. Consider daily probiotics. Take medications as prescribed. Encouraged to take both pneumonia shots and shingrix and to take Tdap if injured. Last pap 2016 she declines today but will consider in future. MGM due in December 2019

## 2018-02-02 NOTE — Assessment & Plan Note (Signed)
Follows with Dr Orvan Seen

## 2018-02-02 NOTE — Assessment & Plan Note (Signed)
Well controlled, no changes to meds. Encouraged heart healthy diet such as the DASH diet and exercise as tolerated.  °

## 2018-02-02 NOTE — Assessment & Plan Note (Addendum)
hgba1c acceptable, minimize simple carbs. Increase exercise as tolerated. Records requested form opthamology whom she saw last month. Foot exam performed at exam

## 2018-02-02 NOTE — Assessment & Plan Note (Signed)

## 2018-02-02 NOTE — Patient Instructions (Addendum)
Shingrix is the new shingles shot, 2 shots over 2-6 months at the pharmacy  Consider another pap in next year or so and MGM  Preventive Care 70 Years and Older, Female Preventive care refers to lifestyle choices and visits with your health care provider that can promote health and wellness. What does preventive care include?  A yearly physical exam. This is also called an annual well check.  Dental exams once or twice a year.  Routine eye exams. Ask your health care provider how often you should have your eyes checked.  Personal lifestyle choices, including: ? Daily care of your teeth and gums. ? Regular physical activity. ? Eating a healthy diet. ? Avoiding tobacco and drug use. ? Limiting alcohol use. ? Practicing safe sex. ? Taking low-dose aspirin every day. ? Taking vitamin and mineral supplements as recommended by your health care provider. What happens during an annual well check? The services and screenings done by your health care provider during your annual well check will depend on your age, overall health, lifestyle risk factors, and family history of disease. Counseling Your health care provider may ask you questions about your:  Alcohol use.  Tobacco use.  Drug use.  Emotional well-being.  Home and relationship well-being.  Sexual activity.  Eating habits.  History of falls.  Memory and ability to understand (cognition).  Work and work Statistician.  Reproductive health.  Screening You may have the following tests or measurements:  Height, weight, and BMI.  Blood pressure.  Lipid and cholesterol levels. These may be checked every 5 years, or more frequently if you are over 70 years old.  Skin check.  Lung cancer screening. You may have this screening every year starting at age 70 if you have a 30-pack-year history of smoking and currently smoke or have quit within the past 15 years.  Fecal occult blood test (FOBT) of the stool. You may have  this test every year starting at age 70.  Flexible sigmoidoscopy or colonoscopy. You may have a sigmoidoscopy every 5 years or a colonoscopy every 10 years starting at age 70.  Hepatitis C blood test.  Hepatitis B blood test.  Sexually transmitted disease (STD) testing.  Diabetes screening. This is done by checking your blood sugar (glucose) after you have not eaten for a while (fasting). You may have this done every 1-3 years.  Bone density scan. This is done to screen for osteoporosis. You may have this done starting at age 70.  Mammogram. This may be done every 1-2 years. Talk to your health care provider about how often you should have regular mammograms.  Talk with your health care provider about your test results, treatment options, and if necessary, the need for more tests. Vaccines Your health care provider may recommend certain vaccines, such as:  Influenza vaccine. This is recommended every year.  Tetanus, diphtheria, and acellular pertussis (Tdap, Td) vaccine. You may need a Td booster every 10 years.  Varicella vaccine. You may need this if you have not been vaccinated.  Zoster vaccine. You may need this after age 70.  Measles, mumps, and rubella (MMR) vaccine. You may need at least one dose of MMR if you were born in 1957 or later. You may also need a second dose.  Pneumococcal 13-valent conjugate (PCV13) vaccine. One dose is recommended after age 66.  Pneumococcal polysaccharide (PPSV23) vaccine. One dose is recommended after age 36.  Meningococcal vaccine. You may need this if you have certain conditions.  Hepatitis  A vaccine. You may need this if you have certain conditions or if you travel or work in places where you may be exposed to hepatitis A.  Hepatitis B vaccine. You may need this if you have certain conditions or if you travel or work in places where you may be exposed to hepatitis B.  Haemophilus influenzae type b (Hib) vaccine. You may need this if  you have certain conditions.  Talk to your health care provider about which screenings and vaccines you need and how often you need them. This information is not intended to replace advice given to you by your health care provider. Make sure you discuss any questions you have with your health care provider. Document Released: 06/29/2015 Document Revised: 02/20/2016 Document Reviewed: 04/03/2015 Elsevier Interactive Patient Education  [employer].

## 2018-02-03 ENCOUNTER — Other Ambulatory Visit: Payer: Self-pay

## 2018-02-03 LAB — HEPATITIS C ANTIBODY
Hepatitis C Ab: NONREACTIVE
SIGNAL TO CUT-OFF: 0.02 (ref <1.00)

## 2018-02-03 MED ORDER — SIMVASTATIN 40 MG PO TABS: 40.0000 mg | ORAL_TABLET | Freq: Every day | ORAL | 5 refills | Status: DC

## 2018-02-06 ENCOUNTER — Ambulatory Visit (HOSPITAL_BASED_OUTPATIENT_CLINIC_OR_DEPARTMENT_OTHER)
Admission: RE | Admit: 2018-02-06 | Discharge: 2018-02-06 | Disposition: A | Discharge status: Home / Self Care | Payer: Medicare Other | Source: Ambulatory Visit | Attending: Family Medicine | Admitting: Family Medicine

## 2018-02-06 DIAGNOSIS — Z9889 Other specified postprocedural states: Secondary | ICD-10-CM | POA: Insufficient documentation

## 2018-02-06 DIAGNOSIS — I671 Cerebral aneurysm, nonruptured: Secondary | ICD-10-CM | POA: Diagnosis present

## 2018-02-06 DIAGNOSIS — R202 Paresthesia of skin: Secondary | ICD-10-CM | POA: Insufficient documentation

## 2018-02-07 NOTE — Assessment & Plan Note (Signed)
Well controlled, no changes to meds. Encouraged heart healthy diet such as the DASH diet and exercise as tolerated.  °

## 2018-02-13 ENCOUNTER — Other Ambulatory Visit (HOSPITAL_BASED_OUTPATIENT_CLINIC_OR_DEPARTMENT_OTHER): Payer: Self-pay

## 2018-02-16 ENCOUNTER — Encounter: Payer: Self-pay | Admitting: Family Medicine

## 2018-02-27 ENCOUNTER — Other Ambulatory Visit: Payer: Self-pay | Admitting: Family Medicine

## 2018-03-31 ENCOUNTER — Other Ambulatory Visit: Payer: Self-pay | Admitting: Family Medicine

## 2018-05-03 ENCOUNTER — Other Ambulatory Visit: Payer: Self-pay | Admitting: Family Medicine

## 2018-05-06 ENCOUNTER — Other Ambulatory Visit: Payer: Self-pay | Admitting: Family Medicine

## 2018-05-10 ENCOUNTER — Telehealth: Payer: Self-pay

## 2018-05-10 NOTE — Telephone Encounter (Signed)
Completed Medicare AWV on 10/22/17

## 2018-06-01 ENCOUNTER — Other Ambulatory Visit: Payer: Self-pay | Admitting: Family Medicine

## 2018-06-25 ENCOUNTER — Other Ambulatory Visit (HOSPITAL_BASED_OUTPATIENT_CLINIC_OR_DEPARTMENT_OTHER): Payer: Self-pay | Admitting: Family Medicine

## 2018-06-25 DIAGNOSIS — Z1231 Encounter for screening mammogram for malignant neoplasm of breast: Secondary | ICD-10-CM

## 2018-06-30 ENCOUNTER — Ambulatory Visit (HOSPITAL_BASED_OUTPATIENT_CLINIC_OR_DEPARTMENT_OTHER)
Admission: RE | Admit: 2018-06-30 | Discharge: 2018-06-30 | Disposition: A | Discharge status: Home / Self Care | Payer: Medicare Other | Source: Ambulatory Visit | Attending: Family Medicine | Admitting: Family Medicine

## 2018-06-30 ENCOUNTER — Other Ambulatory Visit: Payer: Self-pay | Admitting: Family Medicine

## 2018-06-30 DIAGNOSIS — Z1231 Encounter for screening mammogram for malignant neoplasm of breast: Secondary | ICD-10-CM | POA: Insufficient documentation

## 2018-07-04 ENCOUNTER — Other Ambulatory Visit: Payer: Self-pay | Admitting: Family Medicine

## 2018-07-09 ENCOUNTER — Ambulatory Visit: Payer: Medicare Other | Admitting: Family Medicine

## 2018-07-09 DIAGNOSIS — E669 Obesity, unspecified: Secondary | ICD-10-CM | POA: Diagnosis not present

## 2018-07-09 DIAGNOSIS — H269 Unspecified cataract: Secondary | ICD-10-CM

## 2018-07-09 DIAGNOSIS — E1169 Type 2 diabetes mellitus with other specified complication: Secondary | ICD-10-CM | POA: Diagnosis not present

## 2018-07-09 DIAGNOSIS — E559 Vitamin D deficiency, unspecified: Secondary | ICD-10-CM

## 2018-07-09 DIAGNOSIS — I10 Essential (primary) hypertension: Secondary | ICD-10-CM

## 2018-07-09 DIAGNOSIS — Z23 Encounter for immunization: Secondary | ICD-10-CM | POA: Diagnosis not present

## 2018-07-09 DIAGNOSIS — R0789 Other chest pain: Secondary | ICD-10-CM

## 2018-07-09 DIAGNOSIS — M8580 Other specified disorders of bone density and structure, unspecified site: Secondary | ICD-10-CM | POA: Diagnosis not present

## 2018-07-09 DIAGNOSIS — E782 Mixed hyperlipidemia: Secondary | ICD-10-CM

## 2018-07-09 LAB — CBC
HCT: 37.9 % (ref 36.0–46.0)
Hemoglobin: 12.2 g/dL (ref 12.0–15.0)
MCHC: 32.2 g/dL (ref 30.0–36.0)
MCV: 74.6 fl — ABNORMAL LOW (ref 78.0–100.0)
Platelets: 302 10*3/uL (ref 150.0–400.0)
RBC: 5.08 Mil/uL (ref 3.87–5.11)
RDW: 13.3 % (ref 11.5–15.5)
WBC: 8.7 10*3/uL (ref 4.0–10.5)

## 2018-07-09 LAB — COMPREHENSIVE METABOLIC PANEL
ALT: 13 U/L (ref 0–35)
AST: 15 U/L (ref 0–37)
Albumin: 4.5 g/dL (ref 3.5–5.2)
Alkaline Phosphatase: 54 U/L (ref 39–117)
BUN: 22 mg/dL (ref 6–23)
CO2: 36 mEq/L — ABNORMAL HIGH (ref 19–32)
Calcium: 10.9 mg/dL — ABNORMAL HIGH (ref 8.4–10.5)
Chloride: 98 mEq/L (ref 96–112)
Creatinine, Ser: 1.01 mg/dL (ref 0.40–1.20)
GFR: 65.41 mL/min (ref >60.00)
Glucose, Bld: 144 mg/dL — ABNORMAL HIGH (ref 70–99)
Potassium: 4.3 mEq/L (ref 3.5–5.1)
Sodium: 144 mEq/L (ref 135–145)
Total Bilirubin: 0.3 mg/dL (ref 0.2–1.2)
Total Protein: 7.6 g/dL (ref 6.0–8.3)

## 2018-07-09 LAB — HEMOGLOBIN A1C: Hgb A1c MFr Bld: 7.2 % — ABNORMAL HIGH (ref 4.6–6.5)

## 2018-07-09 LAB — LIPID PANEL
Cholesterol: 278 mg/dL — ABNORMAL HIGH (ref 0–200)
HDL: 54.8 mg/dL (ref >39.00)
LDL Cholesterol: 198 mg/dL — ABNORMAL HIGH (ref 0–99)
NonHDL: 223.58
Total CHOL/HDL Ratio: 5
Triglycerides: 129 mg/dL (ref 0.0–149.0)
VLDL: 25.8 mg/dL (ref 0.0–40.0)

## 2018-07-09 LAB — VITAMIN D 25 HYDROXY (VIT D DEFICIENCY, FRACTURES): VITD: 51.7 ng/mL (ref 30.00–100.00)

## 2018-07-09 LAB — TSH: TSH: 2.25 u[IU]/mL (ref 0.35–4.50)

## 2018-07-09 NOTE — Assessment & Plan Note (Signed)
[id number] acceptable, minimize simple carbs. Increase exercise as tolerated.  

## 2018-07-09 NOTE — Assessment & Plan Note (Signed)
Encouraged heart healthy diet, increase exercise, avoid trans fats, consider a krill oil cap daily 

## 2018-07-09 NOTE — Patient Instructions (Signed)
Shingrix is the new shingles shot, 2 shots over 2-6 months at pharmacy  Prevnar then 1 year later Pneumovax and a tetanus shot when injured Hypertension Hypertension, commonly called high blood pressure, is when the force of blood pumping through the arteries is too strong. The arteries are the blood vessels that carry blood from the heart throughout the body. Hypertension forces the heart to work harder to pump blood and may cause arteries to become narrow or stiff. Having untreated or uncontrolled hypertension can cause heart attacks, strokes, kidney disease, and other problems. A blood pressure reading consists of a higher number over a lower number. Ideally, your blood pressure should be below 120/80. The first ("top") number is called the systolic pressure. It is a measure of the pressure in your arteries as your heart beats. The second ("bottom") number is called the diastolic pressure. It is a measure of the pressure in your arteries as the heart relaxes. What are the causes? The cause of this condition is not known. What increases the risk? Some risk factors for high blood pressure are under your control. Others are not. Factors you can change  Smoking.  Having type 2 diabetes mellitus, high cholesterol, or both.  Not getting enough exercise or physical activity.  Being overweight.  Having too much fat, sugar, calories, or salt (sodium) in your diet.  Drinking too much alcohol. Factors that are difficult or impossible to change  Having chronic kidney disease.  Having a family history of high blood pressure.  Age. Risk increases with age.  Race. You may be at higher risk if you are African-American.  Gender. Men are at higher risk than women before age 72. After age 77, women are at higher risk than men.  Having obstructive sleep apnea.  Stress. What are the signs or symptoms? Extremely high blood pressure (hypertensive crisis) may  cause:  Headache.  Anxiety.  Shortness of breath.  Nosebleed.  Nausea and vomiting.  Severe chest pain.  Jerky movements you cannot control (seizures). How is this diagnosed? This condition is diagnosed by measuring your blood pressure while you are seated, with your arm resting on a surface. The cuff of the blood pressure monitor will be placed directly against the skin of your upper arm at the level of your heart. It should be measured at least twice using the same arm. Certain conditions can cause a difference in blood pressure between your right and left arms. Certain factors can cause blood pressure readings to be lower or higher than normal (elevated) for a short period of time:  When your blood pressure is higher when you are in a health care provider's office than when you are at home, this is called white coat hypertension. Most people with this condition do not need medicines.  When your blood pressure is higher at home than when you are in a health care provider's office, this is called masked hypertension. Most people with this condition may need medicines to control blood pressure. If you have a high blood pressure reading during one visit or you have normal blood pressure with other risk factors:  You may be asked to return on a different day to have your blood pressure checked again.  You may be asked to monitor your blood pressure at home for 1 week or longer. If you are diagnosed with hypertension, you may have other blood or imaging tests to help your health care provider understand your overall risk for other conditions. How is this  treated? This condition is treated by making healthy lifestyle changes, such as eating healthy foods, exercising more, and reducing your alcohol intake. Your health care provider may prescribe medicine if lifestyle changes are not enough to get your blood pressure under control, and if:  Your systolic blood pressure is above 130.  Your  diastolic blood pressure is above 80. Your personal target blood pressure may vary depending on your medical conditions, your age, and other factors. Follow these instructions at home: Eating and drinking   Eat a diet that is high in fiber and potassium, and low in sodium, added sugar, and fat. An example eating plan is called the DASH (Dietary Approaches to Stop Hypertension) diet. To eat this way: ? Eat plenty of fresh fruits and vegetables. Try to fill half of your plate at each meal with fruits and vegetables. ? Eat whole grains, such as whole wheat pasta, brown rice, or whole grain bread. Fill about one quarter of your plate with whole grains. ? Eat or drink low-fat dairy products, such as skim milk or low-fat yogurt. ? Avoid fatty cuts of meat, processed or cured meats, and poultry with skin. Fill about one quarter of your plate with lean proteins, such as fish, chicken without skin, beans, eggs, and tofu. ? Avoid premade and processed foods. These tend to be higher in sodium, added sugar, and fat.  Reduce your daily sodium intake. Most people with hypertension should eat less than 1,500 mg of sodium a day.  Limit alcohol intake to no more than 1 drink a day for nonpregnant women and 2 drinks a day for men. One drink equals 12 oz of beer, 5 oz of wine, or 1 oz of hard liquor. Lifestyle   Work with your health care provider to maintain a healthy body weight or to lose weight. Ask what an ideal weight is for you.  Get at least 30 minutes of exercise that causes your heart to beat faster (aerobic exercise) most days of the week. Activities may include walking, swimming, or biking.  Include exercise to strengthen your muscles (resistance exercise), such as pilates or lifting weights, as part of your weekly exercise routine. Try to do these types of exercises for 30 minutes at least 3 days a week.  Do not use any products that contain nicotine or tobacco, such as cigarettes and  e-cigarettes. If you need help quitting, ask your health care provider.  Monitor your blood pressure at home as told by your health care provider.  Keep all follow-up visits as told by your health care provider. This is important. Medicines  Take over-the-counter and prescription medicines only as told by your health care provider. Follow directions carefully. Blood pressure medicines must be taken as prescribed.  Do not skip doses of blood pressure medicine. Doing this puts you at risk for problems and can make the medicine less effective.  Ask your health care provider about side effects or reactions to medicines that you should watch for. Contact a health care provider if:  You think you are having a reaction to a medicine you are taking.  You have headaches that keep coming back (recurring).  You feel dizzy.  You have swelling in your ankles.  You have trouble with your vision. Get help right away if:  You develop a severe headache or confusion.  You have unusual weakness or numbness.  You feel faint.  You have severe pain in your chest or abdomen.  You vomit repeatedly.  You  have trouble breathing. Summary  Hypertension is when the force of blood pumping through your arteries is too strong. If this condition is not controlled, it may put you at risk for serious complications.  Your personal target blood pressure may vary depending on your medical conditions, your age, and other factors. For most people, a normal blood pressure is less than 120/80.  Hypertension is treated with lifestyle changes, medicines, or a combination of both. Lifestyle changes include weight loss, eating a healthy, low-sodium diet, exercising more, and limiting alcohol. This information is not intended to replace advice given to you by your health care provider. Make sure you discuss any questions you have with your health care provider. Document Released: 06/02/2005 Document Revised: 04/30/2016  Document Reviewed: 04/30/2016 Elsevier Interactive Patient Education  2019 [employer].

## 2018-07-09 NOTE — Assessment & Plan Note (Signed)
Well controlled, no changes to meds. Encouraged heart healthy diet such as the DASH diet and exercise as tolerated.  °

## 2018-07-09 NOTE — Assessment & Plan Note (Signed)
Supplement and monitor 

## 2018-07-09 NOTE — Assessment & Plan Note (Signed)
Has surgery scheduled next Friday

## 2018-07-09 NOTE — Assessment & Plan Note (Signed)
Encouraged to get adequate exercise, calcium and vitamin d intake 

## 2018-07-11 NOTE — Progress Notes (Signed)
Subjective:    Patient ID: Morgan Medina, female    DOB: Sep 20, 1945, 73 y.o.   MRN: [id number]  No chief complaint on file.   HPI Patient is in today for follow up on left breast pain. No fall or trauma. No skin changes or discharge. No fevers or chillls. Denies CP/palp/SOB/HA/congestion/fevers/GI or GU c/o. Taking meds as prescribed  Past Medical History:  Diagnosis Date  . Anemia   . Cerebral aneurysm    h/o in 2006  . Dacrocystitis 07/19/2015   right  . Diabetes mellitus type 2 in obese ([place]) 11/05/2014  . GERD (gastroesophageal reflux disease) 04/17/2016  . Hyperlipidemia   . Hypertension   . Osteoarthritis of left hip 12/02/2015  . Osteopenia 11/22/2015  . Overweight     Past Surgical History:  Procedure Laterality Date  . ABDOMINAL HYSTERECTOMY    . CEREBRAL ANEURYSM REPAIR      Family History  Problem Relation Age of Onset  . Cerebral aneurysm Father   . Alzheimer's disease Mother   . Cancer Maternal Aunt        breast  . Diabetes Maternal Grandmother   . Stroke Maternal Grandfather   . Alzheimer's disease Paternal Grandmother   . Stroke Paternal Grandfather     Social History   Socioeconomic History  . Marital status: Widowed    Spouse name: Not on file  . Number of children: Not on file  . Years of education: Not on file  . Highest education level: Not on file  Occupational History  . Not on file  Social Needs  . Financial resource strain: Not on file  . Food insecurity:    Worry: Not on file    Inability: Not on file  . Transportation needs:    Medical: Not on file    Non-medical: Not on file  Tobacco Use  . Smoking status: Never Smoker  . Smokeless tobacco: Never Used  Substance and Sexual Activity  . Alcohol use: No  . Drug use: No  . Sexual activity: Never    Comment: lives with self, no dietary restrictions. works as a Theme park manager, retired from state employment  Lifestyle  . Physical activity:    Days per week: Not on file    Minutes per  session: Not on file  . Stress: Not on file  Relationships  . Social connections:    Talks on phone: Not on file    Gets together: Not on file    Attends religious service: Not on file    Active member of club or organization: Not on file    Attends meetings of clubs or organizations: Not on file    Relationship status: Not on file  . Intimate partner violence:    Fear of current or ex partner: Not on file    Emotionally abused: Not on file    Physically abused: Not on file    Forced sexual activity: Not on file  Other Topics Concern  . Not on file  Social History Narrative  . Not on file    Outpatient Medications Prior to Visit  Medication Sig Dispense Refill  . amLODipine (NORVASC) 5 MG tablet TAKE 1 TABLET BY MOUTH EVERY DAY 30 tablet 5  . aspirin 81 MG tablet Takes every other day    . atenolol (TENORMIN) 50 MG tablet TAKE 1 TABLET BY MOUTH EVERY DAY 30 tablet 3  . Blood Glucose Monitoring Suppl ([place]) W/DEVICE KIT Use to check blood sugar.  DX. E11.9 1 each 0  . Cyanocobalamin (VITAMIN B 12) 250 MCG LOZG Take by mouth.    . cycloSPORINE (RESTASIS) 0.05 % ophthalmic emulsion Place 1 drop into both eyes 2 (two) times daily. 0.4 mL   . ferrous sulfate 325 (65 FE) MG tablet TAKE 1 TABLET BY MOUTH EVERY DAY WITH BREAKFAST 90 tablet 1  . furosemide (LASIX) 20 MG tablet TAKE 2 TABLETS (40 MG TOTAL) EVERY MORNING BY MOUTH. 60 tablet 1  . glucose blood (ONE TOUCH ULTRA TEST) test strip Use as idirected once daily.  DX E11.9 100 each 6  . glucose blood test strip Test once daily to check blood sugar.  DX E11.9 100 each 11  . hydrochlorothiazide (HYDRODIURIL) 25 MG tablet Take 1 tablet (25 mg total) by mouth daily. 90 tablet 3  . KLOR-CON M20 20 MEQ tablet TAKE 1 TABLET BY MOUTH EVERY DAY 30 tablet 3  . losartan (COZAAR) 100 MG tablet TAKE 1 TABLET (100 MG TOTAL) EVERY MORNING BY MOUTH. 90 tablet 1  . Multiple Vitamin (MULTIVITAMIN) capsule Take 1 capsule by mouth  daily.    . ONE TOUCH LANCETS MISC Use as directed once daily to check blood sugar. DX: E11.9 100 each 0  . RESTASIS 0.05 % ophthalmic emulsion INSTILL 1 DROP INTO BOTH EYES TWICE A DAY  3  . simvastatin (ZOCOR) 40 MG tablet Take 1 tablet (40 mg total) by mouth at bedtime. 30 tablet 5   No facility-administered medications prior to visit.     No Known Allergies  Review of Systems  Constitutional: Negative for fever and malaise/fatigue.  HENT: Negative for congestion.   Eyes: Negative for blurred vision.  Respiratory: Negative for shortness of breath.   Cardiovascular: Positive for chest pain. Negative for palpitations and leg swelling.  Gastrointestinal: Negative for abdominal pain, blood in stool and nausea.  Genitourinary: Negative for dysuria and frequency.  Musculoskeletal: Negative for falls.  Skin: Negative for rash.  Neurological: Negative for dizziness, loss of consciousness and headaches.  Endo/Heme/Allergies: Negative for environmental allergies.  Psychiatric/Behavioral: Negative for depression. The patient is not nervous/anxious.        Objective:    Physical Exam Vitals signs and nursing note reviewed.  Constitutional:      General: She is not in acute distress.    Appearance: She is well-developed.  HENT:     Head: Normocephalic and atraumatic.     Nose: Nose normal.  Eyes:     General:        Right eye: No discharge.        Left eye: No discharge.  Neck:     Musculoskeletal: Normal range of motion and neck supple.  Cardiovascular:     Rate and Rhythm: Normal rate and regular rhythm.     Heart sounds: No murmur.  Pulmonary:     Effort: Pulmonary effort is normal.     Breath sounds: Normal breath sounds.  Abdominal:     General: Bowel sounds are normal.     Palpations: Abdomen is soft.     Tenderness: There is no abdominal tenderness.  Genitourinary:    Comments: Breast exam WNL bilaterally. No masses, tenderness, skin changes or discharge.  Skin:     General: Skin is warm and dry.  Neurological:     Mental Status: She is alert and oriented to person, place, and time.     BP 136/84 (BP Location: Left Arm, Patient Position: Sitting, Cuff Size: Normal)   Pulse 66  Temp 98.2 F (36.8 C) (Oral)   Resp 18   Wt 197 lb 12.8 oz (89.7 kg)   SpO2 93%   BMI 30.08 kg/m  Wt Readings from Last 3 Encounters:  07/09/18 197 lb 12.8 oz (89.7 kg)  02/02/18 195 lb 9.6 oz (88.7 kg)  01/26/18 195 lb (88.5 kg)     Lab Results  Component Value Date   WBC 8.7 07/09/2018   HGB 12.2 07/09/2018   HCT 37.9 07/09/2018   PLT 302.0 07/09/2018   GLUCOSE 144 (H) 07/09/2018   CHOL 278 (H) 07/09/2018   TRIG 129.0 07/09/2018   HDL 54.80 07/09/2018   LDLDIRECT 127.0 04/17/2016   LDLCALC 198 (H) 07/09/2018   ALT 13 07/09/2018   AST 15 07/09/2018   NA 144 07/09/2018   K 4.3 07/09/2018   CL 98 07/09/2018   CREATININE 1.01 07/09/2018   BUN 22 07/09/2018   CO2 36 (H) 07/09/2018   TSH 2.25 07/09/2018   HGBA1C 7.2 (H) 07/09/2018    Lab Results  Component Value Date   TSH 2.25 07/09/2018   Lab Results  Component Value Date   WBC 8.7 07/09/2018   HGB 12.2 07/09/2018   HCT 37.9 07/09/2018   MCV 74.6 (L) 07/09/2018   PLT 302.0 07/09/2018   Lab Results  Component Value Date   NA 144 07/09/2018   K 4.3 07/09/2018   CO2 36 (H) 07/09/2018   GLUCOSE 144 (H) 07/09/2018   BUN 22 07/09/2018   CREATININE 1.01 07/09/2018   BILITOT 0.3 07/09/2018   ALKPHOS 54 07/09/2018   AST 15 07/09/2018   ALT 13 07/09/2018   PROT 7.6 07/09/2018   ALBUMIN 4.5 07/09/2018   CALCIUM 10.9 (H) 07/09/2018   ANIONGAP 9 12/04/2017   GFR 65.41 07/09/2018   Lab Results  Component Value Date   CHOL 278 (H) 07/09/2018   Lab Results  Component Value Date   HDL 54.80 07/09/2018   Lab Results  Component Value Date   LDLCALC 198 (H) 07/09/2018   Lab Results  Component Value Date   TRIG 129.0 07/09/2018   Lab Results  Component Value Date   CHOLHDL 5  07/09/2018   Lab Results  Component Value Date   HGBA1C 7.2 (H) 07/09/2018       Assessment & Plan:   Problem List Items Addressed This Visit    HTN (hypertension)    Well controlled, no changes to meds. Encouraged heart healthy diet such as the DASH diet and exercise as tolerated.       Relevant Orders   CBC (Completed)   TSH (Completed)   Hyperlipidemia    Encouraged heart healthy diet, increase exercise, avoid trans fats, consider a krill oil cap daily      Relevant Orders   Lipid panel (Completed)   Diabetes mellitus type 2 in obese ([facility])    hgba1c acceptable, minimize simple carbs. Increase exercise as tolerated.       Relevant Orders   Hemoglobin A1c (Completed)   Comprehensive metabolic panel (Completed)   Left-sided chest wall pain    No fall or trauma but pain is worse with movement. She had a Mammogram to be sure it was not a breast concern. Hurts when she lies on it. Discussed need for ice and topcial treatments alternating with ice and stretching. If no improvement consider referral and/or further imaging.       Osteopenia    Encouraged to get adequate exercise, calcium and vitamin d intake  Cataracts, bilateral    Has surgery scheduled next Friday      Vitamin D deficiency    Supplement and monitor      Relevant Orders   VITAMIN D 25 Hydroxy (Vit-D Deficiency, Fractures) (Completed)      I am having Albirtha Dinkins maintain her multivitamin, Vitamin B 12, aspirin, ONE TOUCH LANCETS, ONE TOUCH BASIC SYSTEM, glucose blood, glucose blood, hydrochlorothiazide, amLODipine, ferrous sulfate, RESTASIS, cycloSPORINE, simvastatin, atenolol, losartan, KLOR-CON M20, and furosemide.  No orders of the defined types were placed in this encounter.    Penni Homans, MD

## 2018-07-11 NOTE — Assessment & Plan Note (Signed)
No fall or trauma but pain is worse with movement. She had a Mammogram to be sure it was not a breast concern. Hurts when she lies on it. Discussed need for ice and topcial treatments alternating with ice and stretching. If no improvement consider referral and/or further imaging.

## 2018-07-31 ENCOUNTER — Other Ambulatory Visit: Payer: Self-pay | Admitting: Family Medicine

## 2018-08-10 ENCOUNTER — Ambulatory Visit: Payer: Self-pay | Admitting: Family Medicine

## 2018-08-16 ENCOUNTER — Other Ambulatory Visit: Payer: Self-pay | Admitting: Family Medicine

## 2018-09-08 ENCOUNTER — Other Ambulatory Visit: Payer: Self-pay | Admitting: Family Medicine

## 2018-10-25 NOTE — Progress Notes (Addendum)
Virtual Visit via Video Note  I connected with patient on 10/26/18 at  9:00 AM EDT by a video enabled telemedicine application and verified that I am speaking with the correct person using two identifiers.   THIS ENCOUNTER IS A VIRTUAL VISIT DUE TO COVID-19 - PATIENT WAS NOT SEEN IN THE OFFICE. PATIENT HAS CONSENTED TO VIRTUAL VISIT / TELEMEDICINE VISIT   Location of patient: home  Location of provider: office  I discussed the limitations of evaluation and management by telemedicine and the availability of in person appointments. The patient expressed understanding and agreed to proceed.   Subjective:   Morgan Medina is a 73 y.o. female who presents for Medicare Annual (Subsequent) preventive examination.  Still works at [employer] a couple of hours per day.   Review of Systems: No ROS.  Medicare Wellness Virtual Visit.  Visual/audio telehealth visit, UTA vital signs.   See social history for additional risk factors. Cardiac Risk Factors include: advanced age (>25mn, >>77women);diabetes mellitus;dyslipidemia;hypertension Sleep patterns: sleeps well 12a--8a. Home Safety/Smoke Alarms: Feels safe in home. Smoke alarms in place.  Lives in 2 story home. Daughter lives with her.   Female:       Mammo- 06/30/18       Dexa scan-  ordered      CCS-last 2011 per abstract     Objective:     Vitals: Unable to assess. This visit is enabled though telemedicine due to Covid 19. Pt reports: BP=141/86. CBG=132.  Advanced Directives 10/26/2018 12/04/2017 10/22/2017 04/17/2016 07/04/2014  Does Patient Have a Medical Advance Directive? _0   Does patient want to make changes to medical advance directive? No - Patient declined - - - -  Would patient like information on creating a medical advance directive? - - Yes (MAU/Ambulatory/Procedural Areas - Information given) No - patient declined information -    Tobacco Social History   Tobacco Use  Smoking Status Never Smoker  Smokeless  Tobacco Never Used     Counseling given: Not Answered   Clinical Intake:     Pain : No/denies pain                 Past Medical History:  Diagnosis Date  . Anemia   . Cerebral aneurysm    h/o in 2006  . Dacrocystitis 07/19/2015   right  . Diabetes mellitus type 2 in obese ([facility] 11/05/2014  . GERD (gastroesophageal reflux disease) 04/17/2016  . Hyperlipidemia   . Hypertension   . Osteoarthritis of left hip 12/02/2015  . Osteopenia 11/22/2015  . Overweight    Past Surgical History:  Procedure Laterality Date  . ABDOMINAL HYSTERECTOMY    . CEREBRAL ANEURYSM REPAIR     Family History  Problem Relation Age of Onset  . Cerebral aneurysm Father   . Alzheimer's disease Mother   . Cancer Maternal Aunt        breast  . Diabetes Maternal Grandmother   . Stroke Maternal Grandfather   . Alzheimer's disease Paternal Grandmother   . Stroke Paternal Grandfather    Social History   Socioeconomic History  . Marital status: Widowed    Spouse name: Not on file  . Number of children: Not on file  . Years of education: Not on file  . Highest education level: Not on file  Occupational History  . Not on file  Social Needs  . Financial resource strain: Not on file  . Food insecurity:    Worry: Not on file  Inability: Not on file  . Transportation needs:    Medical: Not on file    Non-medical: Not on file  Tobacco Use  . Smoking status: Never Smoker  . Smokeless tobacco: Never Used  Substance and Sexual Activity  . Alcohol use: No  . Drug use: No  . Sexual activity: Never    Comment: lives with self, no dietary restrictions. works as a Theme park manager, retired from state employment  Lifestyle  . Physical activity:    Days per week: Not on file    Minutes per session: Not on file  . Stress: Not on file  Relationships  . Social connections:    Talks on phone: Not on file    Gets together: Not on file    Attends religious service: Not on file    Active member of club or  organization: Not on file    Attends meetings of clubs or organizations: Not on file    Relationship status: Not on file  Other Topics Concern  . Not on file  Social History Narrative  . Not on file    Outpatient Encounter Medications as of 10/26/2018  Medication Sig  . amLODipine (NORVASC) 5 MG tablet TAKE 1 TABLET BY MOUTH EVERY DAY  . aspirin 81 MG tablet Takes every other day  . atenolol (TENORMIN) 50 MG tablet TAKE 1 TABLET BY MOUTH EVERY DAY  . Blood Glucose Monitoring Suppl ([place]) W/DEVICE KIT Use to check blood sugar. DX. E11.9  . Cyanocobalamin (VITAMIN B 12) 250 MCG LOZG Take by mouth.  . cycloSPORINE (RESTASIS) 0.05 % ophthalmic emulsion Place 1 drop into both eyes 2 (two) times daily.  . ferrous sulfate 325 (65 FE) MG tablet TAKE 1 TABLET BY MOUTH EVERY DAY WITH BREAKFAST  . furosemide (LASIX) 20 MG tablet TAKE 2 TABLETS (40 MG TOTAL) EVERY MORNING BY MOUTH.  Marland Kitchen glucose blood (ONE TOUCH ULTRA TEST) test strip Use as idirected once daily.  DX E11.9  . glucose blood test strip Test once daily to check blood sugar.  DX E11.9  . hydrochlorothiazide (HYDRODIURIL) 25 MG tablet TAKE 1 TABLET BY MOUTH EVERY DAY  . KLOR-CON M20 20 MEQ tablet TAKE 1 TABLET BY MOUTH EVERY DAY  . losartan (COZAAR) 100 MG tablet TAKE 1 TABLET (100 MG TOTAL) EVERY MORNING BY MOUTH.  . Multiple Vitamin (MULTIVITAMIN) capsule Take 1 capsule by mouth daily.  . ONE TOUCH LANCETS MISC Use as directed once daily to check blood sugar. DX: E11.9  . RESTASIS 0.05 % ophthalmic emulsion INSTILL 1 DROP INTO BOTH EYES TWICE A DAY  . simvastatin (ZOCOR) 40 MG tablet Take 1 tablet (40 mg total) by mouth at bedtime.   No facility-administered encounter medications on file as of 10/26/2018.     Activities of Daily Living In your present state of health, do you have any difficulty performing the following activities: 10/26/2018  Hearing? N  Vision? N  Difficulty concentrating or making decisions? N   Walking or climbing stairs? N  Dressing or bathing? N  Doing errands, shopping? N  Preparing Food and eating ? N  Using the Toilet? N  In the past six months, have you accidently leaked urine? N  Do you have problems with loss of bowel control? N  Managing your Medications? N  Managing your Finances? N  Housekeeping or managing your Housekeeping? N  Some recent data might be hidden    Patient Care Team: Mosie Lukes, MD as PCP -  General (Family Medicine) Rolley Sims, Leslie as Consulting Physician (Optometry)    Assessment:   This is a routine wellness examination for Radhika. Physical assessment deferred to PCP.  Exercise Activities and Dietary recommendations Current Exercise Habits: The patient does not participate in regular exercise at present, Exercise limited by: None identified Diet (meal preparation, eat out, water intake, caffeinated beverages, dairy products, fruits and vegetables): well balanced, on average, 3 meals per day   Goals    . Eat a healther diet    . Increase physical activity     5 min rule 2x/week       Fall Risk Fall Risk  10/26/2018 02/02/2018 10/22/2017 09/03/2017 04/17/2016  Falls in the past year? 0 No No No Yes  Number falls in past yr: - - - - 1  Comment - - - - Pt fell "in a dip in the road."  Injury with Fall? - - - - No     Depression Screen PHQ 2/9 Scores 10/26/2018 02/02/2018 10/22/2017 09/03/2017  PHQ - 2 Score 0 0 0 1  PHQ- 9 Score - - - 6     Cognitive Function Ad8 score reviewed for issues:  Issues making decisions:no  Less interest in hobbies / activities:no  Repeats questions, stories (family complaining):no  Trouble using ordinary gadgets (microwave, computer, phone):no  Forgets the month or year: no  Mismanaging finances: no  Remembering appts:no  Daily problems with thinking and/or memory:no Ad8 score is=0     MMSE - Mini Mental State Exam 04/17/2016  Orientation to time 5  Orientation to Place 5   Registration 3  Attention/ Calculation 5  Recall 3  Language- name 2 objects 2  Language- repeat 1  Language- follow 3 step command 3  Language- read & follow direction 1  Write a sentence 1  Copy design 1  Total score 30        Immunization History  Administered Date(s) Administered  . Influenza, High Dose Seasonal PF 04/17/2016, 06/04/2017, 07/09/2018  . Influenza,inj,Quad PF,6+ Mos 04/11/2014, 05/23/2015    Screening Tests Health Maintenance  Topic Date Due  . FOOT EXAM  09/04/1955  . PNA vac Low Risk Adult (1 of 2 - PCV13) 09/04/2012  . OPHTHALMOLOGY EXAM  08/23/2016  . HEMOGLOBIN A1C  01/07/2019  . INFLUENZA VACCINE  01/15/2019  . COLONOSCOPY  06/15/2020  . TETANUS/TDAP  06/15/2020  . MAMMOGRAM  06/30/2020  . Hepatitis C Screening  Completed  . DEXA SCAN  Addressed      Plan:   See you next year as scheduled.  Continue to eat heart healthy diet (full of fruits, vegetables, whole grains, lean protein, water--limit salt, fat, and sugar intake) and increase physical activity as tolerated.  Continue doing brain stimulating activities (puzzles, reading, adult coloring books, staying active) to keep memory sharp.   Bring a copy of your living will and/or healthcare power of attorney to your next office visit.  I have ordered your bone density scan. They will call you to schedule.  I have personally reviewed and noted the following in the patient's chart:   . Medical and social history . Use of alcohol, tobacco or illicit drugs  . Current medications and supplements . Functional ability and status . Nutritional status . Physical activity . Advanced directives . List of other physicians . Hospitalizations, surgeries, and ER visits in previous 12 months . Vitals . Screenings to include cognitive, depression, and falls . Referrals and appointments  In addition, I have  reviewed and discussed with patient certain preventive protocols, quality metrics, and best  practice recommendations. A written personalized care plan for preventive services as well as general preventive health recommendations were provided to patient.     Shela Nevin, [place]  10/26/2018  Medical screening examination/treatment was performed by qualified clinical staff member and as supervising physician I was immediately available for consultation/collaboration. I have reviewed documentation and agree with assessment and plan.  Penni Homans, MD

## 2018-10-26 ENCOUNTER — Encounter: Payer: Self-pay | Admitting: *Deleted

## 2018-10-26 ENCOUNTER — Ambulatory Visit (INDEPENDENT_AMBULATORY_CARE_PROVIDER_SITE_OTHER): Payer: Medicare Other | Admitting: *Deleted

## 2018-10-26 ENCOUNTER — Other Ambulatory Visit: Payer: Self-pay

## 2018-10-26 DIAGNOSIS — Z0000 Encounter for general adult medical examination without abnormal findings: Secondary | ICD-10-CM | POA: Diagnosis not present

## 2018-10-26 DIAGNOSIS — Z780 Asymptomatic menopausal state: Secondary | ICD-10-CM

## 2018-10-26 NOTE — Patient Instructions (Signed)
See you next year as scheduled.  Continue to eat heart healthy diet (full of fruits, vegetables, whole grains, lean protein, water--limit salt, fat, and sugar intake) and increase physical activity as tolerated.  Continue doing brain stimulating activities (puzzles, reading, adult coloring books, staying active) to keep memory sharp.   Bring a copy of your living will and/or healthcare power of attorney to your next office visit.  I have ordered your bone density scan. They will call you to schedule.   Morgan Medina , Thank you for taking time to come for your Medicare Wellness Visit. I appreciate your ongoing commitment to your health goals. Please review the following plan we discussed and let me know if I can assist you in the future.   These are the goals we discussed: Goals    . Eat a healther diet    . Increase physical activity     5 min rule 2x/week       This is a list of the screening recommended for you and due dates:  Health Maintenance  Topic Date Due  . Complete foot exam   09[birth year]04/1955  . Pneumonia vaccines (1 of 2 - PCV13) 09/04/2012  . Eye exam for diabetics  08/23/2016  . Hemoglobin A1C  01/07/2019  . Flu Shot  01/15/2019  . Colon Cancer Screening  06/15/2020  . Tetanus Vaccine  06/15/2020  . Mammogram  06/30/2020  .  Hepatitis C: One time screening is recommended by Center for Disease Control  (CDC) for  adults born from [birth year] through 1963.   Completed  . DEXA scan (bone density measurement)  Addressed    Health Maintenance After Age 32 After age 59, you are at a higher risk for certain long-term diseases and infections as well as injuries from falls. Falls are a major cause of broken bones and head injuries in people who are older than age 70. Getting regular preventive care can help to keep you healthy and well. Preventive care includes getting regular testing and making lifestyle changes as recommended by your health care provider. Talk with your health care  provider about:  Which screenings and tests you should have. A screening is a test that checks for a disease when you have no symptoms.  A diet and exercise plan that is right for you. What should I know about screenings and tests to prevent falls? Screening and testing are the best ways to find a health problem early. Early diagnosis and treatment give you the best chance of managing medical conditions that are common after age 64. Certain conditions and lifestyle choices may make you more likely to have a fall. Your health care provider may recommend:  Regular vision checks. Poor vision and conditions such as cataracts can make you more likely to have a fall. If you wear glasses, make sure to get your prescription updated if your vision changes.  Medicine review. Work with your health care provider to regularly review all of the medicines you are taking, including over-the-counter medicines. Ask your health care provider about any side effects that may make you more likely to have a fall. Tell your health care provider if any medicines that you take make you feel dizzy or sleepy.  Osteoporosis screening. Osteoporosis is a condition that causes the bones to get weaker. This can make the bones weak and cause them to break more easily.  Blood pressure screening. Blood pressure changes and medicines to control blood pressure can make you feel dizzy.  Strength and balance checks. Your health care provider may recommend certain tests to check your strength and balance while standing, walking, or changing positions.  Foot health exam. Foot pain and numbness, as well as not wearing proper footwear, can make you more likely to have a fall.  Depression screening. You may be more likely to have a fall if you have a fear of falling, feel emotionally low, or feel unable to do activities that you used to do.  Alcohol use screening. Using too much alcohol can affect your balance and may make you more likely  to have a fall. What actions can I take to lower my risk of falls? General instructions  Talk with your health care provider about your risks for falling. Tell your health care provider if: ? You fall. Be sure to tell your health care provider about all falls, even ones that seem minor. ? You feel dizzy, sleepy, or off-balance.  Take over-the-counter and prescription medicines only as told by your health care provider. These include any supplements.  Eat a healthy diet and maintain a healthy weight. A healthy diet includes low-fat dairy products, low-fat (lean) meats, and fiber from whole grains, beans, and lots of fruits and vegetables. Home safety  Remove any tripping hazards, such as rugs, cords, and clutter.  Install safety equipment such as grab bars in bathrooms and safety rails on stairs.  Keep rooms and walkways well-lit. Activity   Follow a regular exercise program to stay fit. This will help you maintain your balance. Ask your health care provider what types of exercise are appropriate for you.  If you need a cane or walker, use it as recommended by your health care provider.  Wear supportive shoes that have nonskid soles. Lifestyle  Do not drink alcohol if your health care provider tells you not to drink.  If you drink alcohol, limit how much you have: ? 0-1 drink a day for women. ? 0-2 drinks a day for men.  Be aware of how much alcohol is in your drink. In the [place] one drink equals one typical bottle of beer (12 oz), one-half glass of wine (5 oz), or one shot of hard liquor (1 oz).  Do not use any products that contain nicotine or tobacco, such as cigarettes and e-cigarettes. If you need help quitting, ask your health care provider. Summary  Having a healthy lifestyle and getting preventive care can help to protect your health and wellness after age 2.  Screening and testing are the best way to find a health problem early and help you avoid having a fall.  Early diagnosis and treatment give you the best chance for managing medical conditions that are more common for people who are older than age 55.  Falls are a major cause of broken bones and head injuries in people who are older than age 59. Take precautions to prevent a fall at home.  Work with your health care provider to learn what changes you can make to improve your health and wellness and to prevent falls. This information is not intended to replace advice given to you by your health care provider. Make sure you discuss any questions you have with your health care provider. Document Released: 04/15/2017 Document Revised: 04/15/2017 Document Reviewed: 04/15/2017 Elsevier Interactive Patient Education  2019 [employer].

## 2018-11-07 ENCOUNTER — Other Ambulatory Visit: Payer: Self-pay | Admitting: Family Medicine

## 2018-11-09 ENCOUNTER — Ambulatory Visit (INDEPENDENT_AMBULATORY_CARE_PROVIDER_SITE_OTHER): Payer: Medicare Other | Admitting: Family Medicine

## 2018-11-09 ENCOUNTER — Other Ambulatory Visit: Payer: Self-pay

## 2018-11-09 DIAGNOSIS — E1169 Type 2 diabetes mellitus with other specified complication: Secondary | ICD-10-CM

## 2018-11-09 DIAGNOSIS — T7840XA Allergy, unspecified, initial encounter: Secondary | ICD-10-CM

## 2018-11-09 DIAGNOSIS — E669 Obesity, unspecified: Secondary | ICD-10-CM

## 2018-11-09 DIAGNOSIS — E559 Vitamin D deficiency, unspecified: Secondary | ICD-10-CM | POA: Diagnosis not present

## 2018-11-09 DIAGNOSIS — E782 Mixed hyperlipidemia: Secondary | ICD-10-CM | POA: Diagnosis not present

## 2018-11-09 DIAGNOSIS — I10 Essential (primary) hypertension: Secondary | ICD-10-CM

## 2018-11-09 MED ORDER — ATENOLOL 50 MG PO TABS: 50.0000 mg | ORAL_TABLET | Freq: Every day | ORAL | 1 refills | Status: DC

## 2018-11-09 MED ORDER — LOSARTAN POTASSIUM 100 MG PO TABS: 100.0000 mg | ORAL_TABLET | Freq: Every morning | ORAL | 1 refills | Status: DC

## 2018-11-09 MED ORDER — AMLODIPINE BESYLATE 5 MG PO TABS: 5.0000 mg | ORAL_TABLET | Freq: Every day | ORAL | 5 refills | Status: DC

## 2018-11-09 NOTE — Assessment & Plan Note (Signed)
[id number] acceptable, minimize simple carbs. Increase exercise as tolerated.  

## 2018-11-09 NOTE — Assessment & Plan Note (Signed)
Encouraged heart healthy diet, increase exercise, avoid trans fats, consider a krill oil cap daily 

## 2018-11-09 NOTE — Assessment & Plan Note (Signed)
Try Zyrtec daily and Mucinex prn

## 2018-11-09 NOTE — Assessment & Plan Note (Signed)
Supplement and monitor 

## 2018-11-09 NOTE — Assessment & Plan Note (Signed)
Blood pressure up over last day or so and she has been out of one of her meds for several days. Refills sent in and patient will have a follow up visit in 1-2 weeks to assess numbers and she can call back with any concerns.

## 2018-11-09 NOTE — Progress Notes (Signed)
Virtual Visit via Telephone Note  I connected with Morgan Medina on 11/09/18 at 10:00 AM EDT by a telephone enabled telemedicine application and verified that I am speaking with the correct person using two identifiers.  Location: Patient: home Provider: home   I discussed the limitations of evaluation and management by telemedicine and the availability of in person appointments. The patient expressed understanding and agreed to proceed. Morgan Medina CMA was unable to get patient on video visit so visit was completed on telephone    Subjective:    Patient ID: Morgan Medina, female    DOB: [birth year], 73 y.o.   MRN: [id number]  No chief complaint on file.   HPI Patient is in today for follow up on chronic medical concerns including hypertension, hyperlipidemia, diabetes, and more. She has been out of one her blood pressure meds for several days. She has also noted some increased head congestion the past few days and she is considering if this might be allergic. No fevers or chills. Denies CP/palp/SOB/HA/congestion/fevers/GI or GU c/o. Taking meds as prescribed  Past Medical History:  Diagnosis Date  . Anemia   . Cerebral aneurysm    h/o in 2006  . Dacrocystitis 07/19/2015   right  . Diabetes mellitus type 2 in obese ([place]) 11/05/2014  . GERD (gastroesophageal reflux disease) 04/17/2016  . Hyperlipidemia   . Hypertension   . Osteoarthritis of left hip 12/02/2015  . Osteopenia 11/22/2015  . Overweight     Past Surgical History:  Procedure Laterality Date  . ABDOMINAL HYSTERECTOMY    . CEREBRAL ANEURYSM REPAIR      Family History  Problem Relation Age of Onset  . Cerebral aneurysm Father   . Alzheimer's disease Mother   . Cancer Maternal Aunt        breast  . Diabetes Maternal Grandmother   . Stroke Maternal Grandfather   . Alzheimer's disease Paternal Grandmother   . Stroke Paternal Grandfather     Social History   Socioeconomic History  . Marital status: Widowed   Spouse name: Not on file  . Number of children: Not on file  . Years of education: Not on file  . Highest education level: Not on file  Occupational History  . Not on file  Social Needs  . Financial resource strain: Not on file  . Food insecurity:    Worry: Not on file    Inability: Not on file  . Transportation needs:    Medical: Not on file    Non-medical: Not on file  Tobacco Use  . Smoking status: Never Smoker  . Smokeless tobacco: Never Used  Substance and Sexual Activity  . Alcohol use: No  . Drug use: No  . Sexual activity: Never    Comment: lives with self, no dietary restrictions. works as a Theme park manager, retired from state employment  Lifestyle  . Physical activity:    Days per week: Not on file    Minutes per session: Not on file  . Stress: Not on file  Relationships  . Social connections:    Talks on phone: Not on file    Gets together: Not on file    Attends religious service: Not on file    Active member of club or organization: Not on file    Attends meetings of clubs or organizations: Not on file    Relationship status: Not on file  . Intimate partner violence:    Fear of current or ex partner: Not on file  Emotionally abused: Not on file    Physically abused: Not on file    Forced sexual activity: Not on file  Other Topics Concern  . Not on file  Social History Narrative  . Not on file    Outpatient Medications Prior to Visit  Medication Sig Dispense Refill  . aspirin 81 MG tablet Takes every other day    . Blood Glucose Monitoring Suppl ([place]) W/DEVICE KIT Use to check blood sugar. DX. E11.9 1 each 0  . Cyanocobalamin (VITAMIN B 12) 250 MCG LOZG Take by mouth.    . cycloSPORINE (RESTASIS) 0.05 % ophthalmic emulsion Place 1 drop into both eyes 2 (two) times daily. 0.4 mL   . ferrous sulfate 325 (65 FE) MG tablet TAKE 1 TABLET BY MOUTH EVERY DAY WITH BREAKFAST 90 tablet 1  . furosemide (LASIX) 20 MG tablet TAKE 2 TABLETS (40 MG TOTAL)  EVERY MORNING BY MOUTH. 180 tablet 1  . glucose blood (ONE TOUCH ULTRA TEST) test strip Use as idirected once daily.  DX E11.9 100 each 6  . glucose blood test strip Test once daily to check blood sugar.  DX E11.9 100 each 11  . hydrochlorothiazide (HYDRODIURIL) 25 MG tablet TAKE 1 TABLET BY MOUTH EVERY DAY 90 tablet 1  . KLOR-CON M20 20 MEQ tablet TAKE 1 TABLET BY MOUTH EVERY DAY 30 tablet 3  . Multiple Vitamin (MULTIVITAMIN) capsule Take 1 capsule by mouth daily.    . ONE TOUCH LANCETS MISC Use as directed once daily to check blood sugar. DX: E11.9 100 each 0  . RESTASIS 0.05 % ophthalmic emulsion INSTILL 1 DROP INTO BOTH EYES TWICE A DAY  3  . simvastatin (ZOCOR) 40 MG tablet Take 1 tablet (40 mg total) by mouth at bedtime. 30 tablet 5  . amLODipine (NORVASC) 5 MG tablet TAKE 1 TABLET BY MOUTH EVERY DAY 30 tablet 5  . atenolol (TENORMIN) 50 MG tablet TAKE 1 TABLET BY MOUTH EVERY DAY 30 tablet 3  . losartan (COZAAR) 100 MG tablet TAKE 1 TABLET (100 MG TOTAL) EVERY MORNING BY MOUTH. 90 tablet 1   No facility-administered medications prior to visit.     No Known Allergies  Review of Systems  Constitutional: Negative for fever and malaise/fatigue.  HENT: Positive for congestion.   Eyes: Negative for blurred vision.  Respiratory: Positive for shortness of breath.   Cardiovascular: Negative for chest pain, palpitations and leg swelling.  Gastrointestinal: Negative for abdominal pain, blood in stool and nausea.  Genitourinary: Negative for dysuria and frequency.  Musculoskeletal: Negative for falls.  Skin: Negative for rash.  Neurological: Negative for dizziness, loss of consciousness and headaches.  Endo/Heme/Allergies: Negative for environmental allergies.  Psychiatric/Behavioral: Negative for depression. The patient is not nervous/anxious.        Objective:    Physical Exam unable to obtain due to telephone encounter  There were no vitals taken for this visit. Wt Readings from  Last 3 Encounters:  07/09/18 197 lb 12.8 oz (89.7 kg)  02/02/18 195 lb 9.6 oz (88.7 kg)  01/26/18 195 lb (88.5 kg)    Diabetic Foot Exam - Simple   No data filed     Lab Results  Component Value Date   WBC 8.7 07/09/2018   HGB 12.2 07/09/2018   HCT 37.9 07/09/2018   PLT 302.0 07/09/2018   GLUCOSE 144 (H) 07/09/2018   CHOL 278 (H) 07/09/2018   TRIG 129.0 07/09/2018   HDL 54.80 07/09/2018   LDLDIRECT  127.0 04/17/2016   LDLCALC 198 (H) 07/09/2018   ALT 13 07/09/2018   AST 15 07/09/2018   NA 144 07/09/2018   K 4.3 07/09/2018   CL 98 07/09/2018   CREATININE 1.01 07/09/2018   BUN 22 07/09/2018   CO2 36 (H) 07/09/2018   TSH 2.25 07/09/2018   HGBA1C 7.2 (H) 07/09/2018    Lab Results  Component Value Date   TSH 2.25 07/09/2018   Lab Results  Component Value Date   WBC 8.7 07/09/2018   HGB 12.2 07/09/2018   HCT 37.9 07/09/2018   MCV 74.6 (L) 07/09/2018   PLT 302.0 07/09/2018   Lab Results  Component Value Date   NA 144 07/09/2018   K 4.3 07/09/2018   CO2 36 (H) 07/09/2018   GLUCOSE 144 (H) 07/09/2018   BUN 22 07/09/2018   CREATININE 1.01 07/09/2018   BILITOT 0.3 07/09/2018   ALKPHOS 54 07/09/2018   AST 15 07/09/2018   ALT 13 07/09/2018   PROT 7.6 07/09/2018   ALBUMIN 4.5 07/09/2018   CALCIUM 10.9 (H) 07/09/2018   ANIONGAP 9 12/04/2017   GFR 65.41 07/09/2018   Lab Results  Component Value Date   CHOL 278 (H) 07/09/2018   Lab Results  Component Value Date   HDL 54.80 07/09/2018   Lab Results  Component Value Date   LDLCALC 198 (H) 07/09/2018   Lab Results  Component Value Date   TRIG 129.0 07/09/2018   Lab Results  Component Value Date   CHOLHDL 5 07/09/2018   Lab Results  Component Value Date   HGBA1C 7.2 (H) 07/09/2018       Assessment & Plan:   Problem List Items Addressed This Visit    HTN (hypertension)    Blood pressure up over last day or so and she has been out of one of her meds for several days. Refills sent in and patient  will have a follow up visit in 1-2 weeks to assess numbers and she can call back with any concerns.      Relevant Medications   amLODipine (NORVASC) 5 MG tablet   losartan (COZAAR) 100 MG tablet   atenolol (TENORMIN) 50 MG tablet   Other Relevant Orders   CBC   Comprehensive metabolic panel   TSH   Hyperlipidemia    Encouraged heart healthy diet, increase exercise, avoid trans fats, consider a krill oil cap daily      Relevant Medications   amLODipine (NORVASC) 5 MG tablet   losartan (COZAAR) 100 MG tablet   atenolol (TENORMIN) 50 MG tablet   Other Relevant Orders   Lipid panel   Diabetes mellitus type 2 in obese (HCC)    hgba1c acceptable, minimize simple carbs. Increase exercise as tolerated.       Relevant Medications   losartan (COZAAR) 100 MG tablet   Other Relevant Orders   Hemoglobin A1c   TSH   Vitamin D deficiency - Primary    Supplement and monitor      Allergies    Try Zyrtec daily and Mucinex prn         I have changed Scottlyn Cirilo's amLODipine, losartan, and atenolol. I am also having her maintain her multivitamin, Vitamin B 12, aspirin, ONE TOUCH LANCETS, ONE TOUCH BASIC SYSTEM, glucose blood, glucose blood, ferrous sulfate, Restasis, cycloSPORINE, simvastatin, [place]-Con M20, furosemide, and hydrochlorothiazide.  Meds ordered this encounter  Medications  . amLODipine (NORVASC) 5 MG tablet    Sig: Take 1 tablet (5 mg total) by  mouth daily.    Dispense:  30 tablet    Refill:  5  . losartan (COZAAR) 100 MG tablet    Sig: Take 1 tablet (100 mg total) by mouth every morning.    Dispense:  90 tablet    Refill:  1  . atenolol (TENORMIN) 50 MG tablet    Sig: Take 1 tablet (50 mg total) by mouth daily.    Dispense:  90 tablet    Refill:  1    I discussed the assessment and treatment plan with the patient. The patient was provided an opportunity to ask questions and all were answered. The patient agreed with the plan and demonstrated an understanding of  the instructions.   The patient was advised to call back or seek an in-person evaluation if the symptoms worsen or if the condition fails to improve as anticipated.  I provided 25 minutes of non-face-to-face time during this encounter.   Penni Homans, MD

## 2019-01-08 ENCOUNTER — Other Ambulatory Visit: Payer: Self-pay | Admitting: Family Medicine

## 2019-02-13 ENCOUNTER — Other Ambulatory Visit: Payer: Self-pay | Admitting: Family Medicine

## 2019-03-09 ENCOUNTER — Other Ambulatory Visit: Payer: Self-pay | Admitting: Family Medicine

## 2019-03-20 ENCOUNTER — Other Ambulatory Visit: Payer: Self-pay | Admitting: Family Medicine

## 2019-05-31 ENCOUNTER — Other Ambulatory Visit: Payer: Self-pay | Admitting: Family Medicine

## 2019-06-02 ENCOUNTER — Other Ambulatory Visit: Payer: Self-pay | Admitting: Family Medicine

## 2019-06-04 ENCOUNTER — Other Ambulatory Visit: Payer: Self-pay | Admitting: Family Medicine

## 2019-06-21 ENCOUNTER — Emergency Department
Admission: EM | Admit: 2019-06-21 | Discharge: 2019-06-21 | Discharge status: Absent without leave | Payer: Medicare PPO | Source: Home / Self Care

## 2019-06-21 ENCOUNTER — Other Ambulatory Visit: Payer: Self-pay

## 2019-06-25 DIAGNOSIS — Z20822 Contact with and (suspected) exposure to covid-19: Secondary | ICD-10-CM | POA: Diagnosis not present

## 2019-06-27 ENCOUNTER — Other Ambulatory Visit: Payer: Self-pay | Admitting: Family Medicine

## 2019-07-31 ENCOUNTER — Other Ambulatory Visit: Payer: Self-pay | Admitting: Family Medicine

## 2019-09-05 ENCOUNTER — Telehealth: Payer: Self-pay | Admitting: Family Medicine

## 2019-09-05 MED ORDER — HYDROCHLOROTHIAZIDE 25 MG PO TABS: 25.0000 mg | ORAL_TABLET | Freq: Every day | ORAL | 1 refills | Status: DC

## 2019-09-05 MED ORDER — ATENOLOL 50 MG PO TABS: 50.0000 mg | ORAL_TABLET | Freq: Every day | ORAL | 3 refills | Status: DC

## 2019-09-05 NOTE — Telephone Encounter (Signed)
Medication sent in Please have patient schedule a CPE with pcp  Please advise

## 2019-09-05 NOTE — Telephone Encounter (Signed)
Medication: hydrochlorothiazide (HYDRODIURIL) 25 Mhydrochlorothiazide   atenolol (TENORMIN) 50 MG tablet [id number]   Has the patient contacted their pharmacy? No. (If no, request that the patient contact the pharmacy for the refill.) (If yes, when and what did the pharmacy advise?)  Preferred Pharmacy (with phone number or street name):   [id number] - [place], [address], [place] [address]  Phone:  [phone] Fax:  [phone]  DEA #:  [id number]    Agent: Please be advised that RX refills may take up to 3 business days. We ask that you follow-up with your pharmacy.

## 2019-09-22 ENCOUNTER — Telehealth: Payer: Self-pay | Admitting: Family Medicine

## 2019-09-22 NOTE — Telephone Encounter (Signed)
Patient notified. Appointment made with another provider tomorrow.  Patient is not having any chest pains.

## 2019-09-22 NOTE — Telephone Encounter (Signed)
Covid Vaccine   First Dose 08/09/19 Second Dose : 08/30/2019

## 2019-09-22 NOTE — Telephone Encounter (Signed)
The vaccine is unlikely to have been the cause but she may need an appointment to evaluate if we need to alter therapy

## 2019-09-22 NOTE — Telephone Encounter (Signed)
Left message on machine to see when she took the vaccine.

## 2019-09-22 NOTE — Telephone Encounter (Signed)
Caller : Maddy Schulz  Call Back # [phone]  Concern : Patient states that her Blood Pressure and Feet/Leg Swelling have been up. 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    Please advise

## 2019-09-23 ENCOUNTER — Other Ambulatory Visit: Payer: Self-pay

## 2019-09-23 ENCOUNTER — Encounter: Payer: Self-pay | Admitting: Medical

## 2019-09-23 ENCOUNTER — Ambulatory Visit (HOSPITAL_BASED_OUTPATIENT_CLINIC_OR_DEPARTMENT_OTHER)
Admission: RE | Admit: 2019-09-23 | Discharge: 2019-09-23 | Disposition: A | Discharge status: Home / Self Care | Payer: Medicare PPO | Source: Ambulatory Visit | Attending: Medical | Admitting: Medical

## 2019-09-23 ENCOUNTER — Ambulatory Visit: Payer: Medicare PPO | Admitting: Medical

## 2019-09-23 VITALS — BP 155/80 | HR 81 | Temp 97.2°F | Resp 18 | Ht 63.0 in | Wt 205.8 lb

## 2019-09-23 DIAGNOSIS — R0600 Dyspnea, unspecified: Secondary | ICD-10-CM

## 2019-09-23 DIAGNOSIS — M79609 Pain in unspecified limb: Secondary | ICD-10-CM

## 2019-09-23 DIAGNOSIS — I10 Essential (primary) hypertension: Secondary | ICD-10-CM | POA: Insufficient documentation

## 2019-09-23 DIAGNOSIS — R600 Localized edema: Secondary | ICD-10-CM | POA: Insufficient documentation

## 2019-09-23 LAB — COMPREHENSIVE METABOLIC PANEL
ALT: 15 U/L (ref 0–35)
AST: 15 U/L (ref 0–37)
Albumin: 4.6 g/dL (ref 3.5–5.2)
Alkaline Phosphatase: 63 U/L (ref 39–117)
BUN: 12 mg/dL (ref 6–23)
CO2: 31 mEq/L (ref 19–32)
Calcium: 10.2 mg/dL (ref 8.4–10.5)
Chloride: 100 mEq/L (ref 96–112)
Creatinine, Ser: 0.78 mg/dL (ref 0.40–1.20)
GFR: 87.83 mL/min (ref >60.00)
Glucose, Bld: 146 mg/dL — ABNORMAL HIGH (ref 70–99)
Potassium: 4 mEq/L (ref 3.5–5.1)
Sodium: 141 mEq/L (ref 135–145)
Total Bilirubin: 0.4 mg/dL (ref 0.2–1.2)
Total Protein: 7.7 g/dL (ref 6.0–8.3)

## 2019-09-23 LAB — BRAIN NATRIURETIC PEPTIDE: Pro B Natriuretic peptide (BNP): 44 pg/mL (ref 0.0–100.0)

## 2019-09-23 NOTE — Progress Notes (Addendum)
   Subjective:    Patient ID: Morgan Medina, female    DOB: 17-Mar-1946, 74 y.o.   MRN: [id number]  HPI   Pt in for recent elevated blood pressure elevation and noting some swelling of her legs.   Pt states systolic bp at times going up 160-180. Pt has tried to restrict her salt intake.  Pt states over past 2-3 weeks noted blood pressure. States checking since feeling swollen in legs.  At times faint low level ha in morning. Not severe and no gross motor or sensory function deficits.   No chest pain reported.Does report mild shortness of breath at times without significant acitivity. No wheezing. No sob lying in bed.   Pt weight has gone up about 2-3 lb in past 2 weeks.  Pt is on lasix 40 mg every morning.   ekg in 2019 nsr.  Pt bp usually runs 80-90 range.   Review of Systems     Objective:   Physical Exam   General Mental Status- Alert. General Appearance- Not in acute distress.   Skin General: Color- Normal Color. Moisture- Normal Moisture.  Neck Carotid Arteries- Normal color. Moisture- Normal Moisture. No carotid bruits. No JVD.  Chest and Lung Exam Auscultation: Breath Sounds:-Normal.  Cardiovascular Auscultation:Rythm- Regular. Murmurs & Other Heart Sounds:Auscultation of the heart reveals- No Murmurs.  Abdomen Inspection:-Inspeection Normal. Palpation/Percussion:Note:No mass. Palpation and Percussion of the abdomen reveal- Non Tender, Non Distended + BS, no rebound or guarding.    Neurologic Cranial Nerve exam:- CN III-XII intact(No nystagmus), symmetric smile. Drift Test:- No drift. Finger to Nose:- Normal/Intact Strength:- 5/5 equal and symmetric strength both upper and lower extremities.  Lower ext- symmetric 1+ pedal edema. No wamth or redness. Popliteal pain on rt side on palpation and prominent bulge compared to left.     Assessment & Plan:  You do have some progressive worsening blood pressure levels recently.  This is despite using  losartan, atenolol and Lasix.  Some mild pedal edema as well with minimal weight gain.  Faint shortness of breath at times as well.  I will get a chest x-ray stat, CMP and BNP stat as well.  Blood pressure moderately elevated now so we will go ahead and advise you take atenolol twice a day.  Continue losartan and Lasix same.  We will follow stat lab results and might adjust by adjusting/increasing Lasix.  We will try to call you or send you my chart update on instructions.  We will make sure not developing CHF type signs and symptoms.  You do have some mild right side popliteal area pain with more prominent bulge on that side.  I did place right lower extremity ultrasound order.  Notify radiology staff that I want the test done today but if not able to do today then latest tomorrow morning.  Follow-up in 7 to 10 days or as needed.    Time spent with patient today was 40  minutes which consisted of chart review, discussing  diagnosis, work up, treatment plan  and documentation.

## 2019-09-23 NOTE — Patient Instructions (Signed)
You do have some progressive worsening blood pressure levels recently.  This is despite using losartan, atenolol and Lasix.  Some mild pedal edema as well with minimal weight gain.  Faint shortness of breath at times as well.  I will get a chest x-ray stat, CMP and BNP stat as well.  Blood pressure moderately elevated now so we will go ahead and advise you take atenolol twice a day.  Continue losartan and Lasix same.  We will follow stat lab results and might adjust by adjusting/increasing Lasix.  We will try to call you or send you my chart update on instructions.  We will make sure not developing CHF type signs and symptoms.  You do have some mild right side popliteal area pain with more prominent bulge on that side.  I did place right lower extremity ultrasound order.  Notify radiology staff that I want the test done today but if not able to do today then latest tomorrow morning.  Follow-up in 7 to 10 days or as needed.

## 2019-09-29 ENCOUNTER — Other Ambulatory Visit: Payer: Self-pay

## 2019-09-30 ENCOUNTER — Other Ambulatory Visit: Payer: Self-pay

## 2019-09-30 ENCOUNTER — Ambulatory Visit: Payer: Medicare PPO | Admitting: Medical

## 2019-09-30 ENCOUNTER — Encounter: Payer: Self-pay | Admitting: Medical

## 2019-09-30 VITALS — BP 130/78 | HR 70 | Temp 97.0°F | Resp 18 | Ht 63.0 in | Wt 208.4 lb

## 2019-09-30 DIAGNOSIS — I10 Essential (primary) hypertension: Secondary | ICD-10-CM | POA: Diagnosis not present

## 2019-09-30 DIAGNOSIS — R600 Localized edema: Secondary | ICD-10-CM | POA: Diagnosis not present

## 2019-09-30 NOTE — Progress Notes (Signed)
   Subjective:    Patient ID: Morgan Medina, female    DOB: 20-Jan-1946, 74 y.o.   MRN: [id number]  HPI  Pt in for follow up on bp. Her bp at at home was 150/80. She has upper arm electronic cuff. She has not checked it every day.  Pt studies on last visit  done to evaluate some mild dyspnea and pedal edema.  Studies were negative for chf.   Pt has not been exercising.  Pt had some rt popliteal area pain and slight bulge. No dvt or baker cyst.  Pt did increase her atenolol.   Her pretibial edema much better now.        Review of Systems negative    Objective:   Physical Exam  General- No acute distress. Pleasant patient. Neck- Full range of motion, no jvd Lungs- Clear, even and unlabored. Heart- regular rate and rhythm. Neurologic- CNII- XII grossly intact.  Lower ext- no pedal edema. Symmetric calfs. Negative homans signs.     Assessment & Plan:  Your blood pressure is much improved with adding the betablocker in afternoon. Continue other meds.  For pedal edema keep legs elevated and use compression socks if needed.  Reviewed neg cxr, neg bnp/labs and [place] lower ext.  Follow up as regularly scheduled with Pcp or as needed  Mackie Pai, PA-C   Time spent with patient today was 20  minutes which consisted of chart review, discussing diagnoses,  Reviewing with pt recent work up, treatment and documentation.

## 2019-09-30 NOTE — Patient Instructions (Addendum)
Your blood pressure is much improved with adding the betablocker in afternoon. Continue other meds.  For pedal edema keep legs elevated and use compression socks if needed.  Reviewed neg cxr, neg bnp/labs and [place] lower ext.  Follow up as regularly scheduled with Pcp or as needed

## 2019-10-14 ENCOUNTER — Other Ambulatory Visit: Payer: Self-pay

## 2019-10-17 ENCOUNTER — Ambulatory Visit: Payer: Medicare PPO | Admitting: Family

## 2019-10-17 ENCOUNTER — Other Ambulatory Visit: Payer: Self-pay

## 2019-10-17 ENCOUNTER — Encounter: Payer: Self-pay | Admitting: Family

## 2019-10-17 VITALS — BP 160/85 | HR 50 | Temp 97.8°F | Resp 16 | Ht 68.0 in | Wt 202.0 lb

## 2019-10-17 DIAGNOSIS — I10 Essential (primary) hypertension: Secondary | ICD-10-CM

## 2019-10-17 DIAGNOSIS — L0291 Cutaneous abscess, unspecified: Secondary | ICD-10-CM

## 2019-10-17 MED ORDER — CEPHALEXIN 500 MG PO CAPS: 500.0000 mg | ORAL_CAPSULE | Freq: Three times a day (TID) | ORAL | 0 refills | Status: DC

## 2019-10-17 MED FILL — CEPHALEXIN 500 MG CAPSULE: 500 | 7 days supply | Qty: 21 | Fill #0

## 2019-10-17 NOTE — Patient Instructions (Addendum)
Please do not get area wet for 24 hours. You may use tylenol as needed for pain. Start keflex (antibiotic). Call if increased pain, redness or swelling.

## 2019-10-17 NOTE — Progress Notes (Signed)
Subjective:    Patient ID: Morgan Medina, female    DOB: 09/13/1945, 74 y.o.   MRN: [id number]  HPI  Patient is a 74 yr old female who presents today with chief complaint of abscess. Abscess is located behind her right ear.    Review of Systems  See HPI  Past Medical History:  Diagnosis Date  . Anemia   . Cerebral aneurysm    h/o in 2006  . Dacrocystitis 07/19/2015   right  . Diabetes mellitus type 2 in obese ([place]) 11/05/2014  . GERD (gastroesophageal reflux disease) 04/17/2016  . Hyperlipidemia   . Hypertension   . Osteoarthritis of left hip 12/02/2015  . Osteopenia 11/22/2015  . Overweight      Social History   Socioeconomic History  . Marital status: Widowed    Spouse name: Not on file  . Number of children: Not on file  . Years of education: Not on file  . Highest education level: Not on file  Occupational History  . Not on file  Tobacco Use  . Smoking status: Never Smoker  . Smokeless tobacco: Never Used  Substance and Sexual Activity  . Alcohol use: No  . Drug use: No  . Sexual activity: Never    Comment: lives with self, no dietary restrictions. works as a Theme park manager, retired from state employment  Other Topics Concern  . Not on file  Social History Narrative  . Not on file   Social Determinants of Health   Financial Resource Strain:   . Difficulty of Paying Living Expenses:   Food Insecurity:   . Worried About Charity fundraiser in the Last Year:   . Arboriculturist in the Last Year:   Transportation Needs:   . Film/video editor (Medical):   Marland Kitchen Lack of Transportation (Non-Medical):   Physical Activity:   . Days of Exercise per Week:   . Minutes of Exercise per Session:   Stress:   . Feeling of Stress :   Social Connections:   . Frequency of Communication with Friends and Family:   . Frequency of Social Gatherings with Friends and Family:   . Attends Religious Services:   . Active Member of Clubs or Organizations:   . Attends Theatre manager Meetings:   Marland Kitchen Marital Status:   Intimate Partner Violence:   . Fear of Current or Ex-Partner:   . Emotionally Abused:   Marland Kitchen Physically Abused:   . Sexually Abused:     Past Surgical History:  Procedure Laterality Date  . ABDOMINAL HYSTERECTOMY    . CEREBRAL ANEURYSM REPAIR      Family History  Problem Relation Age of Onset  . Cerebral aneurysm Father   . Alzheimer's disease Mother   . Cancer Maternal Aunt        breast  . Diabetes Maternal Grandmother   . Stroke Maternal Grandfather   . Alzheimer's disease Paternal Grandmother   . Stroke Paternal Grandfather     No Known Allergies  Current Outpatient Medications on File Prior to Visit  Medication Sig Dispense Refill  . amLODipine (NORVASC) 5 MG tablet TAKE 1 TABLET BY MOUTH EVERY DAY 90 tablet 1  . aspirin 81 MG tablet Takes every other day    . atenolol (TENORMIN) 50 MG tablet Take 1 tablet (50 mg total) by mouth daily. 90 tablet 1  . Blood Glucose Monitoring Suppl ([place]) W/DEVICE KIT Use to check blood sugar. DX. E11.9 1 each  0  . Cyanocobalamin (VITAMIN B 12) 250 MCG LOZG Take by mouth.    . cycloSPORINE (RESTASIS) 0.05 % ophthalmic emulsion Place 1 drop into both eyes 2 (two) times daily. 0.4 mL   . ferrous sulfate 325 (65 FE) MG tablet TAKE 1 TABLET BY MOUTH EVERY DAY WITH BREAKFAST 90 tablet 1  . furosemide (LASIX) 20 MG tablet TAKE 2 TABLETS (40 MG TOTAL) EVERY MORNING BY MOUTH. 60 tablet 1  . glucose blood (ONE TOUCH ULTRA TEST) test strip Use as idirected once daily.  DX E11.9 100 each 6  . glucose blood test strip Test once daily to check blood sugar.  DX E11.9 100 each 11  . hydrochlorothiazide (HYDRODIURIL) 25 MG tablet Take 1 tablet (25 mg total) by mouth daily. 90 tablet 1  . KLOR-CON M20 20 MEQ tablet TAKE 1 TABLET BY MOUTH EVERY DAY 30 tablet 4  . losartan (COZAAR) 100 MG tablet TAKE 1 TABLET BY MOUTH EVERY DAY IN THE MORNING 90 tablet 1  . Multiple Vitamin (MULTIVITAMIN)  capsule Take 1 capsule by mouth daily.    . ONE TOUCH LANCETS MISC Use as directed once daily to check blood sugar. DX: E11.9 100 each 0  . RESTASIS 0.05 % ophthalmic emulsion INSTILL 1 DROP INTO BOTH EYES TWICE A DAY  3  . simvastatin (ZOCOR) 40 MG tablet Take 1 tablet (40 mg total) by mouth at bedtime. 30 tablet 5   No current facility-administered medications on file prior to visit.    BP (!) 160/85 (BP Location: Right Arm, Patient Position: Sitting, Cuff Size: Small)   Pulse (!) 50   Temp 97.8 F (36.6 C) (Temporal)   Resp 16   Ht '5\' 8"'  (1.727 m)   Wt 202 lb (91.6 kg)   SpO2 96%   BMI 30.71 kg/m        Objective:   Physical Exam Constitutional:      Appearance: Normal appearance.  Skin:    Comments: Approximately 55m wide raised erythematous abscess noted behind the right ear, + tenderness, +fluctuance. Mild surrounding swelling  Neurological:     Mental Status: She is alert.           Assessment & Plan:  Abscess- Procedure including risks/benefits explained to patient.  Questions were answered. After informed consent was obtained and a time out completed, site was cleansed with betadine. 1% Lidocaine with epinephrine was injected under lesion once local anesthesia was obtained a small superficial incision was made at the top of the abscess. Minimal drainage was expressed. Pt instructed to keep the area dry for 24 hours and to contact [place] he develops redness, drainage or swelling at the site.  Pt may use tylenol as needed for discomfort today.   Elevated blood pressure reading- BP is up today, she is anxious about the above procedure. Plan to repeat bp at her 1 week follow up and if still elevated will need adjustment of her medication.  BP Readings from Last 3 Encounters:  10/17/19 (!) 160/85  09/30/19 130/78  09/23/19 (!) 155/80

## 2019-10-20 ENCOUNTER — Other Ambulatory Visit: Payer: Self-pay | Admitting: Family Medicine

## 2019-10-21 ENCOUNTER — Telehealth: Payer: Self-pay | Admitting: Family

## 2019-10-21 NOTE — Telephone Encounter (Signed)
[facility] to [facility] sent as this was a duplicate.

## 2019-10-24 ENCOUNTER — Ambulatory Visit (INDEPENDENT_AMBULATORY_CARE_PROVIDER_SITE_OTHER): Payer: Medicare PPO | Admitting: Family

## 2019-10-24 ENCOUNTER — Other Ambulatory Visit: Payer: Self-pay

## 2019-10-24 ENCOUNTER — Encounter: Payer: Self-pay | Admitting: Family

## 2019-10-24 VITALS — BP 169/65 | HR 58 | Temp 97.1°F | Resp 16 | Wt 204.0 lb

## 2019-10-24 DIAGNOSIS — L989 Disorder of the skin and subcutaneous tissue, unspecified: Secondary | ICD-10-CM

## 2019-10-24 NOTE — Progress Notes (Signed)
Subjective:    Patient ID: Morgan Medina, female    DOB: May 07, 1946, 74 y.o.   MRN: [id number]  HPI  Patient is a 74 yr old female who presents today with chief complaint of abscess.  Abscess is located behind her right ear.  Last visit we did an I and D and placed the patient on Keflex.  BP Readings from Last 3 Encounters:  10/24/19 (!) 169/65  10/17/19 (!) 160/85  09/30/19 130/78      Review of Systems See HPI  Past Medical History:  Diagnosis Date  . Anemia   . Cerebral aneurysm    h/o in 2006  . Dacrocystitis 07/19/2015   right  . Diabetes mellitus type 2 in obese ([place]) 11/05/2014  . GERD (gastroesophageal reflux disease) 04/17/2016  . Hyperlipidemia   . Hypertension   . Osteoarthritis of left hip 12/02/2015  . Osteopenia 11/22/2015  . Overweight      Social History   Socioeconomic History  . Marital status: Widowed    Spouse name: Not on file  . Number of children: Not on file  . Years of education: Not on file  . Highest education level: Not on file  Occupational History  . Not on file  Tobacco Use  . Smoking status: Never Smoker  . Smokeless tobacco: Never Used  Substance and Sexual Activity  . Alcohol use: No  . Drug use: No  . Sexual activity: Never    Comment: lives with self, no dietary restrictions. works as a Theme park manager, retired from state employment  Other Topics Concern  . Not on file  Social History Narrative  . Not on file   Social Determinants of Health   Financial Resource Strain:   . Difficulty of Paying Living Expenses:   Food Insecurity:   . Worried About Charity fundraiser in the Last Year:   . Arboriculturist in the Last Year:   Transportation Needs:   . Film/video editor (Medical):   Marland Kitchen Lack of Transportation (Non-Medical):   Physical Activity:   . Days of Exercise per Week:   . Minutes of Exercise per Session:   Stress:   . Feeling of Stress :   Social Connections:   . Frequency of Communication with Friends and Family:    . Frequency of Social Gatherings with Friends and Family:   . Attends Religious Services:   . Active Member of Clubs or Organizations:   . Attends Archivist Meetings:   Marland Kitchen Marital Status:   Intimate Partner Violence:   . Fear of Current or Ex-Partner:   . Emotionally Abused:   Marland Kitchen Physically Abused:   . Sexually Abused:     Past Surgical History:  Procedure Laterality Date  . ABDOMINAL HYSTERECTOMY    . CEREBRAL ANEURYSM REPAIR      Family History  Problem Relation Age of Onset  . Cerebral aneurysm Father   . Alzheimer's disease Mother   . Cancer Maternal Aunt        breast  . Diabetes Maternal Grandmother   . Stroke Maternal Grandfather   . Alzheimer's disease Paternal Grandmother   . Stroke Paternal Grandfather     No Known Allergies  Current Outpatient Medications on File Prior to Visit  Medication Sig Dispense Refill  . amLODipine (NORVASC) 5 MG tablet TAKE 1 TABLET BY MOUTH EVERY DAY 90 tablet 1  . aspirin 81 MG tablet Takes every other day    . atenolol (TENORMIN) 50  MG tablet Take 1 tablet (50 mg total) by mouth daily. 90 tablet 1  . Blood Glucose Monitoring Suppl ([place]) W/DEVICE KIT Use to check blood sugar. DX. E11.9 1 each 0  . cephALEXin (KEFLEX) 500 MG capsule Take 1 capsule (500 mg total) by mouth 3 (three) times daily. 21 capsule 0  . Cyanocobalamin (VITAMIN B 12) 250 MCG LOZG Take by mouth.    . cycloSPORINE (RESTASIS) 0.05 % ophthalmic emulsion Place 1 drop into both eyes 2 (two) times daily. 0.4 mL   . ferrous sulfate 325 (65 FE) MG tablet TAKE 1 TABLET BY MOUTH EVERY DAY WITH BREAKFAST 90 tablet 1  . furosemide (LASIX) 20 MG tablet TAKE 2 TABLETS (40 MG TOTAL) EVERY MORNING BY MOUTH. 60 tablet 1  . glucose blood (ONE TOUCH ULTRA TEST) test strip Use as idirected once daily.  DX E11.9 100 each 6  . glucose blood test strip Test once daily to check blood sugar.  DX E11.9 100 each 11  . hydrochlorothiazide (HYDRODIURIL) 25 MG  tablet Take 1 tablet (25 mg total) by mouth daily. 90 tablet 1  . KLOR-CON M20 20 MEQ tablet TAKE 1 TABLET BY MOUTH EVERY DAY 30 tablet 4  . losartan (COZAAR) 100 MG tablet TAKE 1 TABLET BY MOUTH EVERY DAY IN THE MORNING 90 tablet 1  . Multiple Vitamin (MULTIVITAMIN) capsule Take 1 capsule by mouth daily.    . ONE TOUCH LANCETS MISC Use as directed once daily to check blood sugar. DX: E11.9 100 each 0  . RESTASIS 0.05 % ophthalmic emulsion INSTILL 1 DROP INTO BOTH EYES TWICE A DAY  3  . simvastatin (ZOCOR) 40 MG tablet Take 1 tablet (40 mg total) by mouth at bedtime. 30 tablet 5   No current facility-administered medications on file prior to visit.    BP (!) 169/65 (BP Location: Right Arm, Patient Position: Sitting, Cuff Size: Large)   Pulse (!) 58   Temp (!) 97.1 F (36.2 C) (Temporal)   Resp 16   Wt 204 lb (92.5 kg)   SpO2 99%   BMI 31.02 kg/m       Objective:   Physical Exam Constitutional:      Appearance: Normal appearance.  Skin:    Comments: Raised skin lesion noted behind ear. Mildly tender to palpation  Neurological:     Mental Status: She is alert.           Assessment & Plan:  Skin lesion- ? Cyst, I am not convinced that it is infected at this point. Will refer to dermatology for further evaluation.   This visit occurred during the SARS-CoV-2 public health emergency.  Safety protocols were in place, including screening questions prior to the visit, additional usage of staff PPE, and extensive cleaning of exam room while observing appropriate contact time as indicated for disinfecting solutions.

## 2019-10-24 NOTE — Patient Instructions (Signed)
You should be contacted about your referral to dermatology.  

## 2019-10-28 ENCOUNTER — Ambulatory Visit: Payer: Medicare Other | Admitting: *Deleted

## 2019-11-07 NOTE — Progress Notes (Signed)
I connected with Morgan Medina today by telephone and verified that I am speaking with the correct person using two identifiers. Location patient: home Location provider: work Persons participating in the virtual visit: patient, RN   I discussed the limitations, risks, security and privacy concerns of performing an evaluation and management service by telephone and the availability of in person appointments. I also discussed with the patient that there may be a patient responsible charge related to this service. The patient expressed understanding and verbally consented to this telephonic visit.    Interactive audio and video telecommunications were attempted between RN and patient, however failed, due to patient having technical difficulties OR patient did not have access to video capability.  We continued and completed visit with audio only.  Some vital signs may be absent or patient reported.   Subjective:   Morgan Medina is a 74 y.o. female who presents for Medicare Annual (Subsequent) preventive examination.  Works at [employer] a couple of hrs per day.  Review of Systems:   Home Safety/Smoke Alarms: Feels safe in home. Smoke alarms in place.  Lives in 2 story home. Dtr lives w/ her. Does well w/ stairs.   Female:        Mammo- ordered   Dexa scan-  ordered      CCS- last reported 05/2010  Objective:     Vitals: Unable to assess. This visit is enabled though telemedicine due to Covid 19.   Advanced Directives 11/08/2019 10/26/2018 12/04/2017 10/22/2017 04/17/2016 07/04/2014  Does Patient Have a Medical Advance Directive? _0  No  Does patient want to make changes to medical advance directive? - No - Patient declined - - - -  Would patient like information on creating a medical advance directive? No - Patient declined - - Yes (MAU/Ambulatory/Procedural Areas - Information given) No - patient declined information -    Tobacco Social History   Tobacco Use  Smoking Status  Never Smoker  Smokeless Tobacco Never Used     Counseling given: Not Answered   Clinical Intake: Pain : No/denies pain     Past Medical History:  Diagnosis Date  . Anemia   . Cerebral aneurysm    h/o in 2006  . Dacrocystitis 07/19/2015   right  . Diabetes mellitus type 2 in obese ([place]) 11/05/2014  . GERD (gastroesophageal reflux disease) 04/17/2016  . Hyperlipidemia   . Hypertension   . Osteoarthritis of left hip 12/02/2015  . Osteopenia 11/22/2015  . Overweight    Past Surgical History:  Procedure Laterality Date  . ABDOMINAL HYSTERECTOMY    . CEREBRAL ANEURYSM REPAIR     Family History  Problem Relation Age of Onset  . Cerebral aneurysm Father   . Alzheimer's disease Mother   . Cancer Maternal Aunt        breast  . Diabetes Maternal Grandmother   . Stroke Maternal Grandfather   . Alzheimer's disease Paternal Grandmother   . Stroke Paternal Grandfather    Social History   Socioeconomic History  . Marital status: Widowed    Spouse name: Not on file  . Number of children: Not on file  . Years of education: Not on file  . Highest education level: Not on file  Occupational History  . Not on file  Tobacco Use  . Smoking status: Never Smoker  . Smokeless tobacco: Never Used  Substance and Sexual Activity  . Alcohol use: No  . Drug use: No  . Sexual activity: Never  Comment: lives with self, no dietary restrictions. works as a Theme park manager, retired from state employment  Other Topics Concern  . Not on file  Social History Narrative  . Not on file   Social Determinants of Health   Financial Resource Strain:   . Difficulty of Paying Living Expenses:   Food Insecurity:   . Worried About Charity fundraiser in the Last Year:   . Arboriculturist in the Last Year:   Transportation Needs:   . Film/video editor (Medical):   Marland Kitchen Lack of Transportation (Non-Medical):   Physical Activity:   . Days of Exercise per Week:   . Minutes of Exercise per Session:     Stress:   . Feeling of Stress :   Social Connections:   . Frequency of Communication with Friends and Family:   . Frequency of Social Gatherings with Friends and Family:   . Attends Religious Services:   . Active Member of Clubs or Organizations:   . Attends Archivist Meetings:   Marland Kitchen Marital Status:     Outpatient Encounter Medications as of 11/08/2019  Medication Sig  . amLODipine (NORVASC) 5 MG tablet TAKE 1 TABLET BY MOUTH EVERY DAY  . aspirin 81 MG tablet Takes every other day  . atenolol (TENORMIN) 50 MG tablet Take 1 tablet (50 mg total) by mouth daily.  . Blood Glucose Monitoring Suppl ([place]) W/DEVICE KIT Use to check blood sugar. DX. E11.9  . Cyanocobalamin (VITAMIN B 12) 250 MCG LOZG Take by mouth.  . cycloSPORINE (RESTASIS) 0.05 % ophthalmic emulsion Place 1 drop into both eyes 2 (two) times daily.  . ferrous sulfate 325 (65 FE) MG tablet TAKE 1 TABLET BY MOUTH EVERY DAY WITH BREAKFAST  . furosemide (LASIX) 20 MG tablet TAKE 2 TABLETS (40 MG TOTAL) EVERY MORNING BY MOUTH.  Marland Kitchen glucose blood (ONE TOUCH ULTRA TEST) test strip Use as idirected once daily.  DX E11.9  . glucose blood test strip Test once daily to check blood sugar.  DX E11.9  . hydrochlorothiazide (HYDRODIURIL) 25 MG tablet Take 1 tablet (25 mg total) by mouth daily.  Marland Kitchen KLOR-CON M20 20 MEQ tablet TAKE 1 TABLET BY MOUTH EVERY DAY  . losartan (COZAAR) 100 MG tablet TAKE 1 TABLET BY MOUTH EVERY DAY IN THE MORNING  . Multiple Vitamin (MULTIVITAMIN) capsule Take 1 capsule by mouth daily.  . ONE TOUCH LANCETS MISC Use as directed once daily to check blood sugar. DX: E11.9  . RESTASIS 0.05 % ophthalmic emulsion INSTILL 1 DROP INTO BOTH EYES TWICE A DAY  . simvastatin (ZOCOR) 40 MG tablet Take 1 tablet (40 mg total) by mouth at bedtime.  . [DISCONTINUED] cephALEXin (KEFLEX) 500 MG capsule Take 1 capsule (500 mg total) by mouth 3 (three) times daily.   No facility-administered encounter  medications on file as of 11/08/2019.    Activities of Daily Living In your present state of health, do you have any difficulty performing the following activities: 11/08/2019  Hearing? N  Vision? N  Difficulty concentrating or making decisions? N  Walking or climbing stairs? N  Dressing or bathing? N  Doing errands, shopping? N  Preparing Food and eating ? N  Using the Toilet? N  In the past six months, have you accidently leaked urine? N  Do you have problems with loss of bowel control? N  Managing your Medications? N  Managing your Finances? N  Housekeeping or managing your Housekeeping? N  Some recent data might be hidden    Patient Care Team: Mosie Lukes, MD as PCP - General (Family Medicine) Rolley Sims, [place] as Consulting Physician (Optometry)    Assessment:   This is a routine wellness examination for Morgan Medina. Physical assessment deferred to PCP.   Exercise Activities and Dietary recommendations Current Exercise Habits: The patient does not participate in regular exercise at present, Exercise limited by: None identified Diet (meal preparation, eat out, water intake, caffeinated beverages, dairy products, fruits and vegetables): well balanced   Goals    . Eat a healther diet    . Increase physical activity     5 min rule 2x/week       Fall Risk Fall Risk  11/08/2019 10/26/2018 02/02/2018 10/22/2017 09/03/2017  Falls in the past year? 0 0 No No No  Number falls in past yr: 0 - - - -  Comment - - - - -  Injury with Fall? 0 - - - -  Follow up Education provided;Falls prevention discussed - - - -     Depression Screen PHQ 2/9 Scores 11/08/2019 10/26/2018 02/02/2018 10/22/2017  PHQ - 2 Score 0 0 0 0  PHQ- 9 Score - - - -     Cognitive Function Ad8 score reviewed for issues:  Issues making decisions:no  Less interest in hobbies / activities:no  Repeats questions, stories (family complaining):no  Trouble using ordinary gadgets (microwave, computer,  phone):no  Forgets the month or year: no  Mismanaging finances: no  Remembering appts:no Daily problems with thinking and/or memory:no Ad8 score is=0    MMSE - Mini Mental State Exam 04/17/2016  Orientation to time 5  Orientation to Place 5  Registration 3  Attention/ Calculation 5  Recall 3  Language- name 2 objects 2  Language- repeat 1  Language- follow 3 step command 3  Language- read & follow direction 1  Write a sentence 1  Copy design 1  Total score 30        Immunization History  Administered Date(s) Administered  . Influenza, High Dose Seasonal PF 04/17/2016, 06/04/2017, 07/09/2018  . Influenza,inj,Quad PF,6+ Mos 04/11/2014, 05/23/2015  . PFIZER SARS-COV-2 Vaccination 08/09/2019, 08/30/2019    Screening Tests Health Maintenance  Topic Date Due  . FOOT EXAM  Never done  . PNA vac Low Risk Adult (1 of 2 - PCV13) Never done  . OPHTHALMOLOGY EXAM  08/23/2016  . HEMOGLOBIN A1C  01/07/2019  . INFLUENZA VACCINE  01/15/2020  . COLONOSCOPY  06/15/2020  . TETANUS/TDAP  06/15/2020  . MAMMOGRAM  06/30/2020  . DEXA SCAN  Completed  . COVID-19 Vaccine  Completed  . Hepatitis C Screening  Completed      Plan:    Please schedule your next medicare wellness visit with me in 1 yr.  Continue to eat heart healthy diet (full of fruits, vegetables, whole grains, lean protein, water--limit salt, fat, and sugar intake) and increase physical activity as tolerated.  Continue doing brain stimulating activities (puzzles, reading, adult coloring books, staying active) to keep memory sharp.    I have personally reviewed and noted the following in the patient's chart:   . Medical and social history . Use of alcohol, tobacco or illicit drugs  . Current medications and supplements . Functional ability and status . Nutritional status . Physical activity . Advanced directives . List of other physicians . Hospitalizations, surgeries, and ER visits in previous 12  months . Vitals . Screenings to include cognitive, depression, and falls .  Referrals and appointments  In addition, I have reviewed and discussed with patient certain preventive protocols, quality metrics, and best practice recommendations. A written personalized care plan for preventive services as well as general preventive health recommendations were provided to patient.     Naaman Plummer [place], [place]  11/08/2019

## 2019-11-08 ENCOUNTER — Ambulatory Visit (INDEPENDENT_AMBULATORY_CARE_PROVIDER_SITE_OTHER): Payer: Medicare PPO | Admitting: *Deleted

## 2019-11-08 ENCOUNTER — Other Ambulatory Visit: Payer: Self-pay

## 2019-11-08 ENCOUNTER — Encounter: Payer: Self-pay | Admitting: *Deleted

## 2019-11-08 DIAGNOSIS — Z1231 Encounter for screening mammogram for malignant neoplasm of breast: Secondary | ICD-10-CM

## 2019-11-08 DIAGNOSIS — Z0000 Encounter for general adult medical examination without abnormal findings: Secondary | ICD-10-CM | POA: Diagnosis not present

## 2019-11-08 DIAGNOSIS — Z780 Asymptomatic menopausal state: Secondary | ICD-10-CM

## 2019-11-08 NOTE — Patient Instructions (Signed)
Please schedule your next medicare wellness visit with me in 1 yr.  Continue to eat heart healthy diet (full of fruits, vegetables, whole grains, lean protein, water--limit salt, fat, and sugar intake) and increase physical activity as tolerated.  Continue doing brain stimulating activities (puzzles, reading, adult coloring books, staying active) to keep memory sharp.    Morgan Medina , Thank you for taking time to come for your Medicare Wellness Visit. I appreciate your ongoing commitment to your health goals. Please review the following plan we discussed and let me know if I can assist you in the future.   These are the goals we discussed: Goals    . Eat a healther diet    . Increase physical activity     5 min rule 2x/week       This is a list of the screening recommended for you and due dates:  Health Maintenance  Topic Date Due  . Complete foot exam   Never done  . Pneumonia vaccines (1 of 2 - PCV13) Never done  . Eye exam for diabetics  08/23/2016  . Hemoglobin A1C  01/07/2019  . Flu Shot  01/15/2020  . Colon Cancer Screening  06/15/2020  . Tetanus Vaccine  06/15/2020  . Mammogram  06/30/2020  . DEXA scan (bone density measurement)  Completed  . COVID-19 Vaccine  Completed  .  Hepatitis C: One time screening is recommended by Center for Disease Control  (CDC) for  adults born from [birth year] through 1963.   Completed    Preventive Care 34 Years and Older, Female Preventive care refers to lifestyle choices and visits with your health care provider that can promote health and wellness. This includes:  A yearly physical exam. This is also called an annual well check.  Regular dental and eye exams.  Immunizations.  Screening for certain conditions.  Healthy lifestyle choices, such as diet and exercise. What can I expect for my preventive care visit? Physical exam Your health care provider will check:  Height and weight. These may be used to calculate body mass index (BMI),  which is a measurement that tells if you are at a healthy weight.  Heart rate and blood pressure.  Your skin for abnormal spots. Counseling Your health care provider may ask you questions about:  Alcohol, tobacco, and drug use.  Emotional well-being.  Home and relationship well-being.  Sexual activity.  Eating habits.  History of falls.  Memory and ability to understand (cognition).  Work and work Statistician.  Pregnancy and menstrual history. What immunizations do I need?  Influenza (flu) vaccine  This is recommended every year. Tetanus, diphtheria, and pertussis (Tdap) vaccine  You may need a Td booster every 10 years. Varicella (chickenpox) vaccine  You may need this vaccine if you have not already been vaccinated. Zoster (shingles) vaccine  You may need this after age 67. Pneumococcal conjugate (PCV13) vaccine  One dose is recommended after age 73. Pneumococcal polysaccharide (PPSV23) vaccine  One dose is recommended after age 19. Measles, mumps, and rubella (MMR) vaccine  You may need at least one dose of MMR if you were born in 1957 or later. You may also need a second dose. Meningococcal conjugate (MenACWY) vaccine  You may need this if you have certain conditions. Hepatitis A vaccine  You may need this if you have certain conditions or if you travel or work in places where you may be exposed to hepatitis A. Hepatitis B vaccine  You may need this if  you have certain conditions or if you travel or work in places where you may be exposed to hepatitis B. Haemophilus influenzae type b (Hib) vaccine  You may need this if you have certain conditions. You may receive vaccines as individual doses or as more than one vaccine together in one shot (combination vaccines). Talk with your health care provider about the risks and benefits of combination vaccines. What tests do I need? Blood tests  Lipid and cholesterol levels. These may be checked every 5  years, or more frequently depending on your overall health.  Hepatitis C test.  Hepatitis B test. Screening  Lung cancer screening. You may have this screening every year starting at age 57 if you have a 30-pack-year history of smoking and currently smoke or have quit within the past 15 years.  Colorectal cancer screening. All adults should have this screening starting at age 33 and continuing until age 71. Your health care provider may recommend screening at age 13 if you are at increased risk. You will have tests every 1-10 years, depending on your results and the type of screening test.  Diabetes screening. This is done by checking your blood sugar (glucose) after you have not eaten for a while (fasting). You may have this done every 1-3 years.  Mammogram. This may be done every 1-2 years. Talk with your health care provider about how often you should have regular mammograms.  BRCA-related cancer screening. This may be done if you have a family history of breast, ovarian, tubal, or peritoneal cancers. Other tests  Sexually transmitted disease (STD) testing.  Bone density scan. This is done to screen for osteoporosis. You may have this done starting at age 27. Follow these instructions at home: Eating and drinking  Eat a diet that includes fresh fruits and vegetables, whole grains, lean protein, and low-fat dairy products. Limit your intake of foods with high amounts of sugar, saturated fats, and salt.  Take vitamin and mineral supplements as recommended by your health care provider.  Do not drink alcohol if your health care provider tells you not to drink.  If you drink alcohol: ? Limit how much you have to 0-1 drink a day. ? Be aware of how much alcohol is in your drink. In the [place] one drink equals one 12 oz bottle of beer (355 mL), one 5 oz glass of wine (148 mL), or one 1 oz glass of hard liquor (44 mL). Lifestyle  Take daily care of your teeth and gums.  Stay active.  Exercise for at least 30 minutes on 5 or more days each week.  Do not use any products that contain nicotine or tobacco, such as cigarettes, e-cigarettes, and chewing tobacco. If you need help quitting, ask your health care provider.  If you are sexually active, practice safe sex. Use a condom or other form of protection in order to prevent STIs (sexually transmitted infections).  Talk with your health care provider about taking a low-dose aspirin or statin. What's next?  Go to your health care provider once a year for a well check visit.  Ask your health care provider how often you should have your eyes and teeth checked.  Stay up to date on all vaccines. This information is not intended to replace advice given to you by your health care provider. Make sure you discuss any questions you have with your health care provider. Document Revised: 05/27/2018 Document Reviewed: 05/27/2018 Elsevier Patient Education  2020 [employer].

## 2019-11-10 ENCOUNTER — Other Ambulatory Visit: Payer: Self-pay

## 2019-11-10 ENCOUNTER — Ambulatory Visit: Payer: Medicare PPO | Admitting: Medical

## 2019-11-10 VITALS — BP 149/80 | HR 63 | Resp 18 | Ht 68.0 in | Wt 208.6 lb

## 2019-11-10 DIAGNOSIS — R5383 Other fatigue: Secondary | ICD-10-CM | POA: Diagnosis not present

## 2019-11-10 DIAGNOSIS — I10 Essential (primary) hypertension: Secondary | ICD-10-CM | POA: Diagnosis not present

## 2019-11-10 DIAGNOSIS — E782 Mixed hyperlipidemia: Secondary | ICD-10-CM

## 2019-11-10 DIAGNOSIS — E559 Vitamin D deficiency, unspecified: Secondary | ICD-10-CM

## 2019-11-10 MED ORDER — ATENOLOL 50 MG PO TABS: 50.0000 mg | ORAL_TABLET | Freq: Two times a day (BID) | ORAL | 3 refills | Status: DC

## 2019-11-10 NOTE — Patient Instructions (Addendum)
Your bp is mild high today on second repeat. Would recommend getting back on atenolol 50 mg twice daily. Continue other bp meds same. Want you to relax and try 3 consecutive readings 5 minutes apart each day. Write down bp and pulse readings. Send me my chart update in 6-7 days.  I placed future labs tsh, vit D and lipid panel as noticed those were placed last year around this time during early pandemic. So can get scheuled for future fasting labs at check out.  Follow up date to be determined after my chart bp/pulse reading review.

## 2019-11-10 NOTE — Progress Notes (Signed)
   Subjective:    Patient ID: Morgan Medina, female    DOB: 04-Dec-1945, 74 y.o.   MRN: [id number]  HPI  Pt in for follow up.  She states he at home readings have been about same as today reading. She states systolic number usually around 160. She has no cardiac or neurologic signs/symptoms.  Pt is on amlodipine 5 mg daily, atenolol 50 mg twice daily. Briefly in the past. She not sure what bp readings were during time was on twice a day. She thinks maybe high.   Pt thinks on atenolol twice daily her lower ext swelling was less.  Pt is still taking lasix 2 tab every morning.  She is also on hctz. Rx written by pcp.  Also on losartan 100 mg daily.         Review of Systems  Constitutional: Negative for chills and fever.  Eyes: Negative for photophobia.  Respiratory: Negative for cough, shortness of breath and wheezing.   Cardiovascular: Negative for chest pain and palpitations.  Musculoskeletal: Positive for neck pain.  Neurological: Negative for dizziness, tremors, seizures, weakness and headaches.       Objective:   Physical Exam  General Mental Status- Alert. General Appearance- Not in acute distress.    Chest and Lung Exam Auscultation: Breath Sounds:-Normal.  Cardiovascular Auscultation:Rythm- Regular. Murmurs & Other Heart Sounds:Auscultation of the heart reveals- No Murmurs.   Neurologic Cranial Nerve exam:- CN III-XII intact(No nystagmus), symmetric smile. Strength:- 5/5 equal and symmetric strength both upper and lower extremities.  Lower ext- symmetric 1+ pedal edema. Negative homans signs.     Assessment & Plan:  Your bp is mild high today on second repeat. Would recommend getting back on atenolol 50 mg twice daily. Continue other bp meds same. Want you to relax and try 3 consecutive readings 5 minutes apart each day. Write down bp and pulse readings. Send me my chart update in 6-7 days.  I placed future labs tsh, vit D and lipid panel as noticed  those were placed last year around this time during early pandemic. So can get scheuled for future fasting labs at check out.  Follow up date to be determined after my chart bp/pulse reading review.  Mackie Pai, PA-C   Time spent with patient today was 25   minutes which consisted of chart review, discussing diagnoses, work up, treatment and documentation.

## 2019-11-27 ENCOUNTER — Other Ambulatory Visit: Payer: Self-pay | Admitting: Family Medicine

## 2019-11-30 ENCOUNTER — Other Ambulatory Visit: Payer: Self-pay | Admitting: *Deleted

## 2019-11-30 MED ORDER — LOSARTAN POTASSIUM 100 MG PO TABS: ORAL_TABLET | ORAL | 1 refills | Status: DC

## 2019-12-05 ENCOUNTER — Encounter: Payer: Medicare PPO | Admitting: Family Medicine

## 2020-01-03 ENCOUNTER — Encounter: Payer: Self-pay | Admitting: Family Medicine

## 2020-01-03 ENCOUNTER — Ambulatory Visit (INDEPENDENT_AMBULATORY_CARE_PROVIDER_SITE_OTHER): Payer: Medicare PPO | Admitting: Family Medicine

## 2020-01-03 ENCOUNTER — Other Ambulatory Visit: Payer: Self-pay | Admitting: Family Medicine

## 2020-01-03 ENCOUNTER — Other Ambulatory Visit: Payer: Self-pay

## 2020-01-03 VITALS — BP 122/70 | HR 60 | Temp 98.2°F | Resp 12 | Ht 68.0 in | Wt 207.7 lb

## 2020-01-03 DIAGNOSIS — E669 Obesity, unspecified: Secondary | ICD-10-CM

## 2020-01-03 DIAGNOSIS — E1169 Type 2 diabetes mellitus with other specified complication: Secondary | ICD-10-CM | POA: Diagnosis not present

## 2020-01-03 DIAGNOSIS — E559 Vitamin D deficiency, unspecified: Secondary | ICD-10-CM | POA: Diagnosis not present

## 2020-01-03 DIAGNOSIS — I10 Essential (primary) hypertension: Secondary | ICD-10-CM

## 2020-01-03 DIAGNOSIS — E782 Mixed hyperlipidemia: Secondary | ICD-10-CM

## 2020-01-03 DIAGNOSIS — E2839 Other primary ovarian failure: Secondary | ICD-10-CM

## 2020-01-03 DIAGNOSIS — Z0000 Encounter for general adult medical examination without abnormal findings: Secondary | ICD-10-CM | POA: Diagnosis not present

## 2020-01-03 DIAGNOSIS — M8580 Other specified disorders of bone density and structure, unspecified site: Secondary | ICD-10-CM

## 2020-01-03 DIAGNOSIS — M542 Cervicalgia: Secondary | ICD-10-CM

## 2020-01-03 DIAGNOSIS — N951 Menopausal and female climacteric states: Secondary | ICD-10-CM

## 2020-01-03 LAB — CBC
HCT: 35.4 % — ABNORMAL LOW (ref 36.0–46.0)
Hemoglobin: 11.4 g/dL — ABNORMAL LOW (ref 12.0–15.0)
MCHC: 32.1 g/dL (ref 30.0–36.0)
MCV: 74.6 fl — ABNORMAL LOW (ref 78.0–100.0)
Platelets: 293 10*3/uL (ref 150.0–400.0)
RBC: 4.74 Mil/uL (ref 3.87–5.11)
RDW: 13.5 % (ref 11.5–15.5)
WBC: 9.3 10*3/uL (ref 4.0–10.5)

## 2020-01-03 LAB — HEMOGLOBIN A1C: Hgb A1c MFr Bld: 7.7 % — ABNORMAL HIGH (ref 4.6–6.5)

## 2020-01-03 LAB — COMPREHENSIVE METABOLIC PANEL
ALT: 13 U/L (ref 0–35)
AST: 14 U/L (ref 0–37)
Albumin: 4.4 g/dL (ref 3.5–5.2)
Alkaline Phosphatase: 54 U/L (ref 39–117)
BUN: 16 mg/dL (ref 6–23)
CO2: 34 mEq/L — ABNORMAL HIGH (ref 19–32)
Calcium: 10 mg/dL (ref 8.4–10.5)
Chloride: 100 mEq/L (ref 96–112)
Creatinine, Ser: 0.76 mg/dL (ref 0.40–1.20)
GFR: 90.43 mL/min (ref >60.00)
Glucose, Bld: 140 mg/dL — ABNORMAL HIGH (ref 70–99)
Potassium: 3.9 mEq/L (ref 3.5–5.1)
Sodium: 141 mEq/L (ref 135–145)
Total Bilirubin: 0.3 mg/dL (ref 0.2–1.2)
Total Protein: 7.4 g/dL (ref 6.0–8.3)

## 2020-01-03 LAB — TSH: TSH: 3.11 u[IU]/mL (ref 0.35–4.50)

## 2020-01-03 LAB — LIPID PANEL
Cholesterol: 279 mg/dL — ABNORMAL HIGH (ref 0–200)
HDL: 51 mg/dL (ref >39.00)
NonHDL: 228.27
Total CHOL/HDL Ratio: 5
Triglycerides: 212 mg/dL — ABNORMAL HIGH (ref 0.0–149.0)
VLDL: 42.4 mg/dL — ABNORMAL HIGH (ref 0.0–40.0)

## 2020-01-03 LAB — VITAMIN D 25 HYDROXY (VIT D DEFICIENCY, FRACTURES): VITD: 27.14 ng/mL — ABNORMAL LOW (ref 30.00–100.00)

## 2020-01-03 LAB — LDL CHOLESTEROL, DIRECT: Direct LDL: 181 mg/dL

## 2020-01-03 MED ORDER — TIZANIDINE HCL 2 MG PO TABS: 1.0000 mg | ORAL_TABLET | Freq: Two times a day (BID) | ORAL | 2 refills | Status: DC | PRN

## 2020-01-03 NOTE — Patient Instructions (Signed)

## 2020-01-03 NOTE — Assessment & Plan Note (Signed)
hgba1c acceptable, minimize simple carbs. Increase exercise as tolerated. Continue current meds 

## 2020-01-03 NOTE — Assessment & Plan Note (Addendum)
Encouraged heart healthy diet, increase exercise, avoid trans fats, consider a krill oil cap daily 

## 2020-01-03 NOTE — Assessment & Plan Note (Signed)
Supplement and monitor 

## 2020-01-03 NOTE — Assessment & Plan Note (Signed)
Well controlled, no changes to meds. Encouraged heart healthy diet such as the DASH diet and exercise as tolerated.  °

## 2020-01-04 NOTE — Assessment & Plan Note (Addendum)
Encouraged to get adequate exercise, calcium and vitamin d intake. Dexa scan is ordered.

## 2020-01-04 NOTE — Progress Notes (Signed)
Subjective:    Patient ID: Morgan Medina, female    DOB: 01-Oct-1945, 74 y.o.   MRN: [id number]  Chief Complaint  Patient presents with  . Annual Exam    HPI Patient is in today for annual preventative exam and follow up on chronic medical concerns. No recent febrile illness or hospitalizations. She is mostly doing well but she has noted a significant increase in pain in Morgan Medina neck on the right side. She feels the muscle has been stiff and sore since she had a boil lanced at Morgan Medina right occiput. That has not come up again, no drainage, warmth or tenderness. She is eating well and staying active. Denies CP/palp/SOB/HA/congestion/fevers/GI or GU c/o. Taking meds as prescribed  Past Medical History:  Diagnosis Date  . Anemia   . Cerebral aneurysm    h/o in 2006  . Dacrocystitis 07/19/2015   right  . Diabetes mellitus type 2 in obese ([place]) 11/05/2014  . GERD (gastroesophageal reflux disease) 04/17/2016  . Hyperlipidemia   . Hypertension   . Osteoarthritis of left hip 12/02/2015  . Osteopenia 11/22/2015  . Overweight     Past Surgical History:  Procedure Laterality Date  . ABDOMINAL HYSTERECTOMY    . CEREBRAL ANEURYSM REPAIR      Family History  Problem Relation Age of Onset  . Cerebral aneurysm Father   . Alzheimer's disease Mother   . Cancer Maternal Aunt        breast  . Diabetes Maternal Grandmother   . Stroke Maternal Grandfather   . Alzheimer's disease Paternal Grandmother   . Stroke Paternal Grandfather     Social History   Socioeconomic History  . Marital status: Widowed    Spouse name: Not on file  . Number of children: Not on file  . Years of education: Not on file  . Highest education level: Not on file  Occupational History  . Not on file  Tobacco Use  . Smoking status: Never Smoker  . Smokeless tobacco: Never Used  Substance and Sexual Activity  . Alcohol use: No  . Drug use: No  . Sexual activity: Never    Comment: lives with self, no dietary  restrictions. works as a Theme park manager, retired from state employment  Other Topics Concern  . Not on file  Social History Narrative  . Not on file   Social Determinants of Health   Financial Resource Strain: Low Risk   . Difficulty of Paying Living Expenses: Not hard at all  Food Insecurity: No Food Insecurity  . Worried About Charity fundraiser in the Last Year: Never true  . Ran Out of Food in the Last Year: Never true  Transportation Needs: No Transportation Needs  . Lack of Transportation (Medical): No  . Lack of Transportation (Non-Medical): No  Physical Activity:   . Days of Exercise per Week:   . Minutes of Exercise per Session:   Stress:   . Feeling of Stress :   Social Connections:   . Frequency of Communication with Friends and Family:   . Frequency of Social Gatherings with Friends and Family:   . Attends Religious Services:   . Active Member of Clubs or Organizations:   . Attends Archivist Meetings:   Marland Kitchen Marital Status:   Intimate Partner Violence:   . Fear of Current or Ex-Partner:   . Emotionally Abused:   Marland Kitchen Physically Abused:   . Sexually Abused:     Outpatient Medications Prior to Visit  Medication  Sig Dispense Refill  . amLODipine (NORVASC) 5 MG tablet TAKE 1 TABLET BY MOUTH EVERY DAY 90 tablet 1  . aspirin 81 MG tablet Takes every other day    . atenolol (TENORMIN) 50 MG tablet TAKE 1 TABLET BY MOUTH EVERY DAY (Patient taking differently: Take 50 mg by mouth 2 (two) times daily. ) 90 tablet 1  . Blood Glucose Monitoring Suppl ([employer]) W/DEVICE KIT Use to check blood sugar. DX. E11.9 1 each 0  . Cyanocobalamin (VITAMIN B 12) 250 MCG LOZG Take by mouth.    . cycloSPORINE (RESTASIS) 0.05 % ophthalmic emulsion Place 1 drop into both eyes 2 (two) times daily. 0.4 mL   . ferrous sulfate 325 (65 FE) MG tablet TAKE 1 TABLET BY MOUTH EVERY DAY WITH BREAKFAST 90 tablet 1  . furosemide (LASIX) 20 MG tablet TAKE 2 TABLETS (40 MG TOTAL) EVERY  MORNING BY MOUTH. 60 tablet 1  . glucose blood (ONE TOUCH ULTRA TEST) test strip Use as idirected once daily.  DX E11.9 100 each 6  . glucose blood test strip Test once daily to check blood sugar.  DX E11.9 100 each 11  . hydrochlorothiazide (HYDRODIURIL) 25 MG tablet Take 1 tablet (25 mg total) by mouth daily. 90 tablet 1  . KLOR-CON M20 20 MEQ tablet TAKE 1 TABLET BY MOUTH EVERY DAY 30 tablet 4  . losartan (COZAAR) 100 MG tablet TAKE 1 TABLET BY MOUTH EVERY DAY IN THE MORNING 90 tablet 1  . Multiple Vitamin (MULTIVITAMIN) capsule Take 1 capsule by mouth daily.    . ONE TOUCH LANCETS MISC Use as directed once daily to check blood sugar. DX: E11.9 100 each 0  . RESTASIS 0.05 % ophthalmic emulsion INSTILL 1 DROP INTO BOTH EYES TWICE A DAY  3  . simvastatin (ZOCOR) 40 MG tablet Take 1 tablet (40 mg total) by mouth at bedtime. 30 tablet 5   No facility-administered medications prior to visit.    No Known Allergies  Review of Systems  Constitutional: Negative for fever and malaise/fatigue.  HENT: Negative for congestion and hearing loss.   Eyes: Negative for blurred vision.  Respiratory: Negative for shortness of breath.   Cardiovascular: Negative for chest pain, palpitations and leg swelling.  Gastrointestinal: Negative for abdominal pain, blood in stool and nausea.  Genitourinary: Negative for dysuria and frequency.  Musculoskeletal: Positive for myalgias and neck pain. Negative for falls.  Skin: Negative for rash.  Neurological: Negative for dizziness, loss of consciousness and headaches.  Endo/Heme/Allergies: Negative for environmental allergies.  Psychiatric/Behavioral: Negative for depression. The patient is not nervous/anxious.        Objective:    Physical Exam Constitutional:      General: She is not in acute distress.    Appearance: She is not diaphoretic.  HENT:     Head: Normocephalic and atraumatic.     Right Ear: External ear normal.     Left Ear: External ear  normal.     Nose: Nose normal.     Mouth/Throat:     Pharynx: No oropharyngeal exudate.  Eyes:     General: No scleral icterus.       Right eye: No discharge.        Left eye: No discharge.     Conjunctiva/sclera: Conjunctivae normal.     Pupils: Pupils are equal, round, and reactive to light.  Neck:     Thyroid: No thyromegaly.  Cardiovascular:     Rate and Rhythm: Normal  rate and regular rhythm.     Heart sounds: Normal heart sounds. No murmur heard.   Pulmonary:     Effort: Pulmonary effort is normal. No respiratory distress.     Breath sounds: Normal breath sounds. No wheezing or rales.  Abdominal:     General: Bowel sounds are normal. There is no distension.     Palpations: Abdomen is soft. There is no mass.     Tenderness: There is no abdominal tenderness.  Musculoskeletal:        General: No tenderness. Normal range of motion.     Cervical back: Normal range of motion and neck supple.     Comments: Muscle spasm noted over right SCM muscle.   Lymphadenopathy:     Cervical: No cervical adenopathy.  Skin:    General: Skin is warm and dry.     Findings: No rash.  Neurological:     Mental Status: She is alert and oriented to person, place, and time.     Cranial Nerves: No cranial nerve deficit.     Coordination: Coordination normal.     Deep Tendon Reflexes: Reflexes are normal and symmetric. Reflexes normal.     BP 122/70 (BP Location: Right Arm, Cuff Size: Large)   Pulse 60   Temp 98.2 F (36.8 C) (Oral)   Resp 12   Ht '5\' 8"'  (1.727 m)   Wt 207 lb 11.2 oz (94.2 kg)   SpO2 97%   BMI 31.58 kg/m  Wt Readings from Last 3 Encounters:  01/03/20 207 lb 11.2 oz (94.2 kg)  11/10/19 208 lb 9.6 oz (94.6 kg)  10/24/19 204 lb (92.5 kg)    Diabetic Foot Exam - Simple   No data filed     Lab Results  Component Value Date   WBC 9.3 01/03/2020   HGB 11.4 (L) 01/03/2020   HCT 35.4 (L) 01/03/2020   PLT 293.0 01/03/2020   GLUCOSE 140 (H) 01/03/2020   CHOL 279 (H)  01/03/2020   TRIG 212.0 (H) 01/03/2020   HDL 51.00 01/03/2020   LDLDIRECT 181.0 01/03/2020   LDLCALC 198 (H) 07/09/2018   ALT 13 01/03/2020   AST 14 01/03/2020   NA 141 01/03/2020   K 3.9 01/03/2020   CL 100 01/03/2020   CREATININE 0.76 01/03/2020   BUN 16 01/03/2020   CO2 34 (H) 01/03/2020   TSH 3.11 01/03/2020   HGBA1C 7.7 (H) 01/03/2020    Lab Results  Component Value Date   TSH 3.11 01/03/2020   Lab Results  Component Value Date   WBC 9.3 01/03/2020   HGB 11.4 (L) 01/03/2020   HCT 35.4 (L) 01/03/2020   MCV 74.6 (L) 01/03/2020   PLT 293.0 01/03/2020   Lab Results  Component Value Date   NA 141 01/03/2020   K 3.9 01/03/2020   CO2 34 (H) 01/03/2020   GLUCOSE 140 (H) 01/03/2020   BUN 16 01/03/2020   CREATININE 0.76 01/03/2020   BILITOT 0.3 01/03/2020   ALKPHOS 54 01/03/2020   AST 14 01/03/2020   ALT 13 01/03/2020   PROT 7.4 01/03/2020   ALBUMIN 4.4 01/03/2020   CALCIUM 10.0 01/03/2020   ANIONGAP 9 12/04/2017   GFR 90.43 01/03/2020   Lab Results  Component Value Date   CHOL 279 (H) 01/03/2020   Lab Results  Component Value Date   HDL 51.00 01/03/2020   Lab Results  Component Value Date   LDLCALC 198 (H) 07/09/2018   Lab Results  Component Value Date  TRIG 212.0 (H) 01/03/2020   Lab Results  Component Value Date   CHOLHDL 5 01/03/2020   Lab Results  Component Value Date   HGBA1C 7.7 (H) 01/03/2020       Assessment & Plan:   Problem List Items Addressed This Visit    HTN (hypertension)    Well controlled, no changes to meds. Encouraged heart healthy diet such as the DASH diet and exercise as tolerated.       Relevant Orders   CBC (Completed)   Comprehensive metabolic panel (Completed)   TSH (Completed)   Hyperlipidemia    Encouraged heart healthy diet, increase exercise, avoid trans fats, consider a krill oil cap daily      Relevant Orders   Lipid panel (Completed)   Diabetes mellitus type 2 in obese ([facility])    hgba1c acceptable,  minimize simple carbs. Increase exercise as tolerated. Continue current meds      Relevant Orders   Hemoglobin A1c (Completed)   Preventative health care    Patient encouraged to maintain heart healthy diet, regular exercise, adequate sleep. Consider daily probiotics. Take medications as prescribed. Labs ordered and reviewed.       Osteopenia    Encouraged to get adequate exercise, calcium and vitamin d intake. Dexa scan is ordered.      Relevant Orders   DG Bone Density   Neck pain    Encouraged moist heat and gentle stretching as tolerated. May try NSAIDs and prescription meds as directed and report if symptoms worsen or seek immediate care. Given Tizanidine prn      Vitamin D deficiency    Supplement and monitor      Relevant Orders   VITAMIN D 25 Hydroxy (Vit-D Deficiency, Fractures) (Completed)    Other Visit Diagnoses    Estrogen deficiency    -  Primary   Relevant Orders   DG Bone Density   Post menopausal syndrome       Relevant Orders   DG Bone Density      I am having Morgan Medina start on tiZANidine. I am also having Morgan Medina maintain Morgan Medina multivitamin, Vitamin B 12, aspirin, ONE TOUCH LANCETS, ONE TOUCH BASIC SYSTEM, glucose blood, glucose blood, ferrous sulfate, Restasis, cycloSPORINE, simvastatin, Klor-Con M20, hydrochlorothiazide, furosemide, atenolol, losartan, and amLODipine.  Meds ordered this encounter  Medications  . tiZANidine (ZANAFLEX) 2 MG tablet    Sig: Take 0.5-2 tablets (1-4 mg total) by mouth 2 (two) times daily as needed for muscle spasms.    Dispense:  60 tablet    Refill:  2     Penni Homans, MD

## 2020-01-04 NOTE — Assessment & Plan Note (Signed)
Patient encouraged to maintain heart healthy diet, regular exercise, adequate sleep. Consider daily probiotics. Take medications as prescribed. Labs ordered and reviewed 

## 2020-01-04 NOTE — Assessment & Plan Note (Signed)
Encouraged moist heat and gentle stretching as tolerated. May try NSAIDs and prescription meds as directed and report if symptoms worsen or seek immediate care. Given Tizanidine prn

## 2020-01-06 ENCOUNTER — Other Ambulatory Visit: Payer: Self-pay | Admitting: Family Medicine

## 2020-01-06 ENCOUNTER — Other Ambulatory Visit: Payer: Self-pay | Admitting: *Deleted

## 2020-01-06 DIAGNOSIS — I10 Essential (primary) hypertension: Secondary | ICD-10-CM

## 2020-01-06 DIAGNOSIS — E669 Obesity, unspecified: Secondary | ICD-10-CM

## 2020-01-06 DIAGNOSIS — E1169 Type 2 diabetes mellitus with other specified complication: Secondary | ICD-10-CM

## 2020-01-06 DIAGNOSIS — E782 Mixed hyperlipidemia: Secondary | ICD-10-CM

## 2020-01-06 MED ORDER — ROSUVASTATIN CALCIUM 5 MG PO TABS: ORAL_TABLET | ORAL | 3 refills | Status: DC

## 2020-01-06 MED ORDER — METFORMIN HCL 500 MG PO TABS: 500.0000 mg | ORAL_TABLET | Freq: Two times a day (BID) | ORAL | 3 refills | Status: DC

## 2020-01-12 ENCOUNTER — Ambulatory Visit (HOSPITAL_BASED_OUTPATIENT_CLINIC_OR_DEPARTMENT_OTHER)
Admission: RE | Admit: 2020-01-12 | Discharge: 2020-01-12 | Disposition: A | Discharge status: Home / Self Care | Payer: Medicare PPO | Source: Ambulatory Visit | Attending: Family Medicine | Admitting: Family Medicine

## 2020-01-12 ENCOUNTER — Other Ambulatory Visit: Payer: Self-pay

## 2020-01-12 DIAGNOSIS — M8580 Other specified disorders of bone density and structure, unspecified site: Secondary | ICD-10-CM | POA: Insufficient documentation

## 2020-01-12 DIAGNOSIS — E2839 Other primary ovarian failure: Secondary | ICD-10-CM | POA: Insufficient documentation

## 2020-01-12 DIAGNOSIS — N951 Menopausal and female climacteric states: Secondary | ICD-10-CM | POA: Insufficient documentation

## 2020-01-12 DIAGNOSIS — Z1231 Encounter for screening mammogram for malignant neoplasm of breast: Secondary | ICD-10-CM | POA: Diagnosis present

## 2020-01-16 ENCOUNTER — Other Ambulatory Visit: Payer: Self-pay | Admitting: Family Medicine

## 2020-02-04 ENCOUNTER — Other Ambulatory Visit: Payer: Self-pay | Admitting: Family Medicine

## 2020-02-06 ENCOUNTER — Telehealth: Payer: Self-pay | Admitting: Family Medicine

## 2020-02-06 NOTE — Telephone Encounter (Signed)
Patient taking Metformin 500 mg bid. Patient is not taking on empty stomach but still having issues. She is constantly nauseous. No vomiting or diarrhea however.    [facility] if anything else is sent in.

## 2020-02-06 NOTE — Telephone Encounter (Signed)
Patient states she is on metformin it is causing nausea, Patient states she would like a call back in reference to medication problems.   Please advise

## 2020-02-06 NOTE — Telephone Encounter (Signed)
If she is willing we should send in the Metformin ER 500 mg tabs 1 tab po bid. The extended release ones usually have less side effects. #60 with 2 RF or 180 at patient discretion. If she still does not tolerate this then we will have to change meds altogether.

## 2020-02-07 MED ORDER — METFORMIN HCL ER 500 MG PO TB24: 500.0000 mg | ORAL_TABLET | Freq: Two times a day (BID) | ORAL | 2 refills | Status: DC

## 2020-02-07 NOTE — Telephone Encounter (Signed)
Called informed of PCP instructions. She verbalized understanding//was ok to change medication. Sent to local pharmacy. The patient had no further questions or concerns.

## 2020-02-27 ENCOUNTER — Encounter: Payer: Self-pay | Admitting: Medical

## 2020-02-27 ENCOUNTER — Ambulatory Visit: Payer: Medicare PPO | Admitting: Medical

## 2020-02-27 ENCOUNTER — Other Ambulatory Visit: Payer: Self-pay

## 2020-02-27 VITALS — BP 130/75 | HR 55 | Temp 98.4°F | Resp 16 | Ht 68.0 in | Wt 204.4 lb

## 2020-02-27 DIAGNOSIS — R5383 Other fatigue: Secondary | ICD-10-CM | POA: Diagnosis not present

## 2020-02-27 DIAGNOSIS — R110 Nausea: Secondary | ICD-10-CM | POA: Diagnosis not present

## 2020-02-27 DIAGNOSIS — I10 Essential (primary) hypertension: Secondary | ICD-10-CM | POA: Diagnosis not present

## 2020-02-27 DIAGNOSIS — R7989 Other specified abnormal findings of blood chemistry: Secondary | ICD-10-CM

## 2020-02-27 DIAGNOSIS — L299 Pruritus, unspecified: Secondary | ICD-10-CM

## 2020-02-27 DIAGNOSIS — R195 Other fecal abnormalities: Secondary | ICD-10-CM

## 2020-02-27 DIAGNOSIS — E669 Obesity, unspecified: Secondary | ICD-10-CM

## 2020-02-27 DIAGNOSIS — E1169 Type 2 diabetes mellitus with other specified complication: Secondary | ICD-10-CM

## 2020-02-27 MED ORDER — SITAGLIPTIN PHOSPHATE 50 MG PO TABS: 50.0000 mg | ORAL_TABLET | Freq: Every day | ORAL | 0 refills | Status: DC

## 2020-02-27 MED ORDER — SITAGLIPTIN PHOSPHATE 25 MG PO TABS: 25.0000 mg | ORAL_TABLET | Freq: Every day | ORAL | 3 refills | Status: DC

## 2020-02-27 NOTE — Progress Notes (Signed)
Subjective:    Patient ID: Morgan Medina, female    DOB: April 08, 1946, 74 y.o.   MRN: [id number]  HPI  Pt in with report of feeling symptoms of fatigue, nausea, itching, sometime decreased appetite with the nausea and loose stools/diarrhea.(for about 6 weeks)  Pt states in past was on metformin 500 mg twice daily then was switched to extended release.  A1c last checked 7.7.   She has some intermittent itching at times.   Pt never just started on low dose metformin. Started out twice a day. She never started with just once daily as pcp oringinally advised to start.   Pt has been vaccinated against covid.   Review of Systems  Constitutional: Positive for appetite change and fatigue. Negative for chills and fever.  Respiratory: Negative for cough, chest tightness, shortness of breath and wheezing.   Cardiovascular: Negative for chest pain and palpitations.  Gastrointestinal: Positive for diarrhea and nausea. Negative for abdominal pain.  Skin:       Itching.  Hematological: Negative for adenopathy. Does not bruise/bleed easily.   Past Medical History:  Diagnosis Date  . Anemia   . Cerebral aneurysm    h/o in 2006  . Dacrocystitis 07/19/2015   right  . Diabetes mellitus type 2 in obese ([place]) 11/05/2014  . GERD (gastroesophageal reflux disease) 04/17/2016  . Hyperlipidemia   . Hypertension   . Osteoarthritis of left hip 12/02/2015  . Osteopenia 11/22/2015  . Overweight      Social History   Socioeconomic History  . Marital status: Widowed    Spouse name: Not on file  . Number of children: Not on file  . Years of education: Not on file  . Highest education level: Not on file  Occupational History  . Not on file  Tobacco Use  . Smoking status: Never Smoker  . Smokeless tobacco: Never Used  Substance and Sexual Activity  . Alcohol use: No  . Drug use: No  . Sexual activity: Never    Comment: lives with self, no dietary restrictions. works as a Theme park manager, retired from state  employment  Other Topics Concern  . Not on file  Social History Narrative  . Not on file   Social Determinants of Health   Financial Resource Strain: Low Risk   . Difficulty of Paying Living Expenses: Not hard at all  Food Insecurity: No Food Insecurity  . Worried About Charity fundraiser in the Last Year: Never true  . Ran Out of Food in the Last Year: Never true  Transportation Needs: No Transportation Needs  . Lack of Transportation (Medical): No  . Lack of Transportation (Non-Medical): No  Physical Activity:   . Days of Exercise per Week: Not on file  . Minutes of Exercise per Session: Not on file  Stress:   . Feeling of Stress : Not on file  Social Connections:   . Frequency of Communication with Friends and Family: Not on file  . Frequency of Social Gatherings with Friends and Family: Not on file  . Attends Religious Services: Not on file  . Active Member of Clubs or Organizations: Not on file  . Attends Archivist Meetings: Not on file  . Marital Status: Not on file  Intimate Partner Violence:   . Fear of Current or Ex-Partner: Not on file  . Emotionally Abused: Not on file  . Physically Abused: Not on file  . Sexually Abused: Not on file    Past Surgical History:  Procedure Laterality Date  . ABDOMINAL HYSTERECTOMY    . CEREBRAL ANEURYSM REPAIR      Family History  Problem Relation Age of Onset  . Cerebral aneurysm Father   . Alzheimer's disease Mother   . Cancer Maternal Aunt        breast  . Diabetes Maternal Grandmother   . Stroke Maternal Grandfather   . Alzheimer's disease Paternal Grandmother   . Stroke Paternal Grandfather     No Known Allergies  Current Outpatient Medications on File Prior to Visit  Medication Sig Dispense Refill  . amLODipine (NORVASC) 5 MG tablet TAKE 1 TABLET BY MOUTH EVERY DAY 90 tablet 1  . aspirin 81 MG tablet Takes every other day    . atenolol (TENORMIN) 50 MG tablet TAKE 1 TABLET BY MOUTH EVERY DAY  (Patient taking differently: Take 50 mg by mouth 2 (two) times daily. ) 90 tablet 1  . Blood Glucose Monitoring Suppl ([employer]) W/DEVICE KIT Use to check blood sugar. DX. E11.9 1 each 0  . Cyanocobalamin (VITAMIN B 12) 250 MCG LOZG Take by mouth.    . cycloSPORINE (RESTASIS) 0.05 % ophthalmic emulsion Place 1 drop into both eyes 2 (two) times daily. 0.4 mL   . ferrous sulfate 325 (65 FE) MG tablet TAKE 1 TABLET BY MOUTH EVERY DAY WITH BREAKFAST 90 tablet 1  . furosemide (LASIX) 20 MG tablet TAKE 2 TABLETS (40 MG TOTAL) EVERY MORNING BY MOUTH. 60 tablet 1  . glucose blood (ONE TOUCH ULTRA TEST) test strip Use as idirected once daily.  DX E11.9 100 each 6  . glucose blood test strip Test once daily to check blood sugar.  DX E11.9 100 each 11  . hydrochlorothiazide (HYDRODIURIL) 25 MG tablet Take 1 tablet (25 mg total) by mouth daily. 90 tablet 1  . KLOR-CON M20 20 MEQ tablet TAKE 1 TABLET BY MOUTH EVERY DAY 30 tablet 4  . losartan (COZAAR) 100 MG tablet TAKE 1 TABLET BY MOUTH EVERY DAY IN THE MORNING 90 tablet 1  . metFORMIN (GLUCOPHAGE-XR) 500 MG 24 hr tablet Take 1 tablet (500 mg total) by mouth 2 (two) times daily. 60 tablet 2  . Multiple Vitamin (MULTIVITAMIN) capsule Take 1 capsule by mouth daily.    . ONE TOUCH LANCETS MISC Use as directed once daily to check blood sugar. DX: E11.9 100 each 0  . RESTASIS 0.05 % ophthalmic emulsion INSTILL 1 DROP INTO BOTH EYES TWICE A DAY  3  . rosuvastatin (CRESTOR) 5 MG tablet Take 1 tablet by mouth at bedtime on Tuesdays and Saturdays 10 tablet 3  . simvastatin (ZOCOR) 40 MG tablet Take 1 tablet (40 mg total) by mouth at bedtime. 30 tablet 5  . tiZANidine (ZANAFLEX) 2 MG tablet Take 0.5-2 tablets (1-4 mg total) by mouth 2 (two) times daily as needed for muscle spasms. 60 tablet 2   No current facility-administered medications on file prior to visit.    BP (!) 163/67 (BP Location: Left Arm)   Pulse (!) 55   Temp 98.4 F (36.9 C) (Oral)    Resp 16   Ht _0  (1.727 m)   Wt 204 lb 6.4 oz (92.7 kg)   SpO2 100%   BMI 31.08 kg/m       Objective:   Physical Exam  General- No acute distress. Pleasant patient. Neck- Full range of motion, no jvd Lungs- Clear, even and unlabored. Heart- regular rate and rhythm. Neurologic- CNII- XII grossly intact. Abdomen-soft,  nt, nt, +bs,no rebound or guarding. Back- no cva tenderness. Lower ext- no pedal eema.      Assessment & Plan:  BP was initially elevated.  Better on review.  Do want you to check your blood pressure 2-3 times a week and make sure not elevated.   History of diabetes and A1c was elevated.  Possible GI side effects with Metformin.  I want you to decrease Metformin to 1 tablet daily and add Januvia.  Hopefully this will be covered under your insurance.  For fatigue will get CBC, CMP, TSH, T4, B12, B1 and a vitamin D level.  Also will include iron level.  For nausea, prescribe Zofran.  For loose stools, will get stool panel studies.  If you have loose stools then please turn in the stool kit as soon as possible.  For low vitamin D follow vitamin D level. For history of itching recently will add ammonia level to your labs.  Follow-up in 7 to 10 days or as needed.  Time spent with patient today was 40+   minutes which consisted of chart review, discussing diagnoses, work up, treatment and documentation.  Mackie Pai, PA-C

## 2020-02-27 NOTE — Patient Instructions (Signed)
BP was initially elevated.  Better on review.  Do want you to check your blood pressure 2-3 times a week and make sure not elevated.   History of diabetes and A1c was elevated.  Possible GI side effects with Metformin.  I want you to decrease Metformin to 1 tablet daily and add Januvia.  Hopefully this will be covered under your insurance.  For fatigue will get CBC, CMP, TSH, T4, B12, B1 and a vitamin D level.  Also will include iron level.  For nausea, prescribe Zofran.  For loose stools, will get stool panel studies.  If you have loose stools then please turn in the stool kit as soon as possible.  For low vitamin D follow vitamin D level. For history of itching recently will add ammonia level to your labs.  Follow-up in 7 to 10 days or as needed.

## 2020-02-28 LAB — CBC WITH DIFFERENTIAL/PLATELET
Absolute Monocytes: 481 cells/uL (ref 200–950)
Basophils Absolute: 33 cells/uL (ref 0–200)
Basophils Relative: 0.4 %
Eosinophils Absolute: 100 cells/uL (ref 15–500)
Eosinophils Relative: 1.2 %
HCT: 36.1 % (ref 35.0–45.0)
Hemoglobin: 12 g/dL (ref 11.7–15.5)
Lymphs Abs: 2540 cells/uL (ref 850–3900)
MCH: 24.5 pg — ABNORMAL LOW (ref 27.0–33.0)
MCHC: 33.2 g/dL (ref 32.0–36.0)
MCV: 73.7 fL — ABNORMAL LOW (ref 80.0–100.0)
MPV: 10.4 fL (ref 7.5–12.5)
Monocytes Relative: 5.8 %
Neutro Abs: 5146 cells/uL (ref 1500–7800)
Neutrophils Relative %: 62 %
Platelets: 317 10*3/uL (ref 140–400)
RBC: 4.9 10*6/uL (ref 3.80–5.10)
RDW: 12.9 % (ref 11.0–15.0)
Total Lymphocyte: 30.6 %
WBC: 8.3 10*3/uL (ref 3.8–10.8)

## 2020-02-28 LAB — COMPREHENSIVE METABOLIC PANEL
AG Ratio: 1.5 (calc) (ref 1.0–2.5)
ALT: 12 U/L (ref 6–29)
AST: 17 U/L (ref 10–35)
Albumin: 4.7 g/dL (ref 3.6–5.1)
Alkaline phosphatase (APISO): 58 U/L (ref 37–153)
BUN: 14 mg/dL (ref 7–25)
CO2: 35 mmol/L — ABNORMAL HIGH (ref 20–32)
Calcium: 10.5 mg/dL — ABNORMAL HIGH (ref 8.6–10.4)
Chloride: 100 mmol/L (ref 98–110)
Creat: 0.75 mg/dL (ref 0.60–0.93)
Globulin: 3.2 g/dL (calc) (ref 1.9–3.7)
Glucose, Bld: 102 mg/dL — ABNORMAL HIGH (ref 65–99)
Potassium: 3.9 mmol/L (ref 3.5–5.3)
Sodium: 143 mmol/L (ref 135–146)
Total Bilirubin: 0.4 mg/dL (ref 0.2–1.2)
Total Protein: 7.9 g/dL (ref 6.1–8.1)

## 2020-02-28 LAB — VITAMIN B12: Vitamin B-12: 1178 pg/mL — ABNORMAL HIGH (ref 200–1100)

## 2020-02-28 LAB — VITAMIN D 25 HYDROXY (VIT D DEFICIENCY, FRACTURES): Vit D, 25-Hydroxy: 40 ng/mL (ref 30–100)

## 2020-02-28 LAB — T4, FREE: Free T4: 1.1 ng/dL (ref 0.8–1.8)

## 2020-02-28 LAB — TSH: TSH: 1.94 mIU/L (ref 0.40–4.50)

## 2020-02-28 LAB — IRON: Iron: 125 ug/dL (ref 45–160)

## 2020-03-02 LAB — VITAMIN B1: Vitamin B1 (Thiamine): 15 nmol/L (ref 8–30)

## 2020-03-05 ENCOUNTER — Other Ambulatory Visit: Payer: Self-pay | Admitting: Family Medicine

## 2020-03-12 ENCOUNTER — Telehealth: Payer: Self-pay | Admitting: Medical

## 2020-03-12 ENCOUNTER — Other Ambulatory Visit: Payer: Self-pay

## 2020-03-12 ENCOUNTER — Ambulatory Visit: Payer: Medicare PPO | Admitting: Medical

## 2020-03-12 NOTE — Progress Notes (Signed)
   Subjective:    Patient ID: Morgan Medina, female    DOB: 13-Sep-1945, 74 y.o.   MRN: [id number]  HPI  Pt left without being seen. I was running about one hour behind. No charge and will ask staff to schedule with pcp. Also ask staff to apologize that was running behind.  Review of Systems     Objective:   Physical Exam        Assessment & Plan:

## 2020-03-12 NOTE — Telephone Encounter (Signed)
Will you schedule pt with her pcp. I was running behind today about one hour and she left. She was behind 2 patients that both took more than 40 minutes each. Aplolgize for me. But also would you schedule her to see pcp. My work up for fatigue has been unrevealing. Best if she follow up with pcp.

## 2020-04-02 ENCOUNTER — Other Ambulatory Visit: Payer: Self-pay | Admitting: Family Medicine

## 2020-04-02 ENCOUNTER — Ambulatory Visit: Payer: Medicare PPO | Admitting: Family

## 2020-04-09 ENCOUNTER — Other Ambulatory Visit: Payer: Medicare PPO

## 2020-04-23 ENCOUNTER — Other Ambulatory Visit: Payer: Self-pay | Admitting: Family Medicine

## 2020-04-25 ENCOUNTER — Telehealth: Payer: Self-pay

## 2020-04-25 ENCOUNTER — Other Ambulatory Visit: Payer: Self-pay

## 2020-04-25 MED ORDER — FUROSEMIDE 20 MG PO TABS: 40.0000 mg | ORAL_TABLET | Freq: Every morning | ORAL | 3 refills | Status: DC

## 2020-04-25 NOTE — Telephone Encounter (Signed)
Patient called requesting refill on furosemide 20 MG TAB [facility] [address], [facility] location.

## 2020-04-25 NOTE — Telephone Encounter (Signed)
Medication sent to pharmacy  

## 2020-05-02 ENCOUNTER — Other Ambulatory Visit: Payer: Self-pay | Admitting: Family Medicine

## 2020-06-15 ENCOUNTER — Other Ambulatory Visit: Payer: Self-pay | Admitting: Family Medicine

## 2020-07-19 ENCOUNTER — Other Ambulatory Visit: Payer: Self-pay | Admitting: Family Medicine

## 2020-08-06 ENCOUNTER — Other Ambulatory Visit: Payer: Self-pay | Admitting: Family Medicine

## 2020-08-13 ENCOUNTER — Other Ambulatory Visit: Payer: Self-pay | Admitting: Family Medicine

## 2020-08-20 ENCOUNTER — Encounter: Payer: Self-pay | Admitting: Family Medicine

## 2020-08-20 ENCOUNTER — Other Ambulatory Visit: Payer: Self-pay

## 2020-08-20 ENCOUNTER — Ambulatory Visit (INDEPENDENT_AMBULATORY_CARE_PROVIDER_SITE_OTHER): Payer: Medicare PPO | Admitting: Family Medicine

## 2020-08-20 VITALS — BP 160/64 | HR 52 | Temp 98.1°F | Resp 12 | Ht 68.0 in | Wt 193.6 lb

## 2020-08-20 DIAGNOSIS — E559 Vitamin D deficiency, unspecified: Secondary | ICD-10-CM

## 2020-08-20 DIAGNOSIS — R0789 Other chest pain: Secondary | ICD-10-CM

## 2020-08-20 DIAGNOSIS — I10 Essential (primary) hypertension: Secondary | ICD-10-CM | POA: Diagnosis not present

## 2020-08-20 DIAGNOSIS — E1169 Type 2 diabetes mellitus with other specified complication: Secondary | ICD-10-CM

## 2020-08-20 DIAGNOSIS — E669 Obesity, unspecified: Secondary | ICD-10-CM | POA: Diagnosis not present

## 2020-08-20 DIAGNOSIS — R7989 Other specified abnormal findings of blood chemistry: Secondary | ICD-10-CM | POA: Diagnosis not present

## 2020-08-20 DIAGNOSIS — D508 Other iron deficiency anemias: Secondary | ICD-10-CM | POA: Diagnosis not present

## 2020-08-20 DIAGNOSIS — E782 Mixed hyperlipidemia: Secondary | ICD-10-CM

## 2020-08-20 MED ORDER — LOSARTAN POTASSIUM 100 MG PO TABS: 100.0000 mg | ORAL_TABLET | Freq: Every evening | ORAL | 1 refills | Status: DC

## 2020-08-20 MED ORDER — CARVEDILOL 25 MG PO TABS: 25.0000 mg | ORAL_TABLET | Freq: Two times a day (BID) | ORAL | 3 refills | Status: DC

## 2020-08-20 NOTE — Progress Notes (Signed)
Subjective:    Patient ID: Morgan Medina, female    DOB: 10/02/1945, 75 y.o.   MRN: [id number]  Chief Complaint  Patient presents with  . Follow-up    HPI Patient is in today for follow up on chronic medical concerns. No recent febrile illness or hospitalizations. She is feeling well and her stress levels are down but her BP still is running high. When it is elevated she has a mild ache in her head but she says not a full headache. No other acute concerns. Denies CP/palp/SOB/HA/congestion/fevers/GI or GU c/o. Taking meds as prescribed  Past Medical History:  Diagnosis Date  . Anemia   . Cerebral aneurysm    h/o in 2006  . Dacrocystitis 07/19/2015   right  . Diabetes mellitus type 2 in obese ([place]) 11/05/2014  . GERD (gastroesophageal reflux disease) 04/17/2016  . Hyperlipidemia   . Hypertension   . Osteoarthritis of left hip 12/02/2015  . Osteopenia 11/22/2015  . Overweight     Past Surgical History:  Procedure Laterality Date  . ABDOMINAL HYSTERECTOMY    . CEREBRAL ANEURYSM REPAIR      Family History  Problem Relation Age of Onset  . Cerebral aneurysm Father   . Alzheimer's disease Mother   . Cancer Maternal Aunt        breast  . Diabetes Maternal Grandmother   . Stroke Maternal Grandfather   . Alzheimer's disease Paternal Grandmother   . Stroke Paternal Grandfather     Social History   Socioeconomic History  . Marital status: Widowed    Spouse name: Not on file  . Number of children: Not on file  . Years of education: Not on file  . Highest education level: Not on file  Occupational History  . Not on file  Tobacco Use  . Smoking status: Never Smoker  . Smokeless tobacco: Never Used  Substance and Sexual Activity  . Alcohol use: No  . Drug use: No  . Sexual activity: Never    Comment: lives with self, no dietary restrictions. works as a Theme park manager, retired from state employment  Other Topics Concern  . Not on file  Social History Narrative  . Not on file    Social Determinants of Health   Financial Resource Strain: Low Risk   . Difficulty of Paying Living Expenses: Not hard at all  Food Insecurity: No Food Insecurity  . Worried About Charity fundraiser in the Last Year: Never true  . Ran Out of Food in the Last Year: Never true  Transportation Needs: No Transportation Needs  . Lack of Transportation (Medical): No  . Lack of Transportation (Non-Medical): No  Physical Activity: Not on file  Stress: Not on file  Social Connections: Not on file  Intimate Partner Violence: Not on file    Outpatient Medications Prior to Visit  Medication Sig Dispense Refill  . amLODipine (NORVASC) 5 MG tablet TAKE 1 TABLET BY MOUTH EVERY DAY 90 tablet 0  . aspirin 81 MG tablet Takes every other day    . Blood Glucose Monitoring Suppl ([place]) W/DEVICE KIT Use to check blood sugar. DX. E11.9 1 each 0  . Cyanocobalamin (VITAMIN B 12) 250 MCG LOZG Take by mouth.    . ferrous sulfate 325 (65 FE) MG tablet TAKE 1 TABLET BY MOUTH EVERY DAY WITH BREAKFAST 90 tablet 1  . furosemide (LASIX) 20 MG tablet Take 2 tablets (40 mg total) by mouth every morning. 60 tablet 3  .  glucose blood (ONE TOUCH ULTRA TEST) test strip Use as idirected once daily.  DX E11.9 (Patient taking differently: Use as idirected once daily.  DX E11.9) 100 each 6  . KLOR-CON M20 20 MEQ tablet TAKE 1 TABLET BY MOUTH EVERY DAY 30 tablet 4  . metFORMIN (GLUCOPHAGE-XR) 500 MG 24 hr tablet Take 1 tablet (500 mg total) by mouth 2 (two) times daily. (Patient taking differently: Take 500 mg by mouth daily.) 180 tablet 0  . Multiple Vitamin (MULTIVITAMIN) capsule Take 1 capsule by mouth daily.    . ONE TOUCH LANCETS MISC Use as directed once daily to check blood sugar. DX: E11.9 100 each 0  . RESTASIS 0.05 % ophthalmic emulsion INSTILL 1 DROP INTO BOTH EYES TWICE A DAY  3  . tiZANidine (ZANAFLEX) 2 MG tablet Take 0.5-2 tablets (1-4 mg total) by mouth 2 (two) times daily as needed for  muscle spasms. 60 tablet 2  . atenolol (TENORMIN) 50 MG tablet TAKE 1 TABLET BY MOUTH EVERY DAY 90 tablet 1  . losartan (COZAAR) 100 MG tablet Take 1 tablet (100 mg total) by mouth daily. 90 tablet 0  . cycloSPORINE (RESTASIS) 0.05 % ophthalmic emulsion Place 1 drop into both eyes 2 (two) times daily. (Patient not taking: Reported on 03/12/2020) 0.4 mL   . glucose blood test strip Test once daily to check blood sugar.  DX E11.9 100 each 11  . hydrochlorothiazide (HYDRODIURIL) 25 MG tablet Take 1 tablet (25 mg total) by mouth daily. 90 tablet 1  . rosuvastatin (CRESTOR) 5 MG tablet TAKE 1 TABLET BY MOUTH AT BEDTIME ON TUESDAYS AND SATURDAYS 90 tablet 1  . sitaGLIPtin (JANUVIA) 50 MG tablet Take 1 tablet (50 mg total) by mouth daily. Fill 50 mg and not 25 mg Rx. 30 tablet 0   No facility-administered medications prior to visit.    No Known Allergies  Review of Systems  Constitutional: Negative for fever and malaise/fatigue.  HENT: Negative for congestion.   Eyes: Negative for blurred vision.  Respiratory: Negative for shortness of breath.   Cardiovascular: Negative for chest pain, palpitations and leg swelling.  Gastrointestinal: Negative for abdominal pain, blood in stool and nausea.  Genitourinary: Negative for dysuria and frequency.  Musculoskeletal: Negative for falls.  Skin: Negative for rash.  Neurological: Negative for dizziness, loss of consciousness and headaches.  Endo/Heme/Allergies: Negative for environmental allergies.  Psychiatric/Behavioral: Negative for depression. The patient is not nervous/anxious.        Objective:    Physical Exam Vitals and nursing note reviewed.  Constitutional:      General: She is not in acute distress.    Appearance: She is well-developed and well-nourished.  HENT:     Head: Normocephalic and atraumatic.     Nose: Nose normal.  Eyes:     General:        Right eye: No discharge.        Left eye: No discharge.  Cardiovascular:     Rate  and Rhythm: Normal rate and regular rhythm.     Heart sounds: No murmur heard.   Pulmonary:     Effort: Pulmonary effort is normal.     Breath sounds: Normal breath sounds.  Abdominal:     General: Bowel sounds are normal.     Palpations: Abdomen is soft.     Tenderness: There is no abdominal tenderness.  Musculoskeletal:        General: No edema.     Cervical back: Normal range of  motion and neck supple.  Skin:    General: Skin is warm and dry.  Neurological:     Mental Status: She is alert and oriented to person, place, and time.  Psychiatric:        Mood and Affect: Mood and affect normal.     BP (!) 160/64 (BP Location: Right Arm, Cuff Size: Large)   Pulse (!) 52   Temp 98.1 F (36.7 C) (Oral)   Resp 12   Ht '5\' 8"'  (1.727 m)   Wt 193 lb 9.6 oz (87.8 kg)   SpO2 100%   BMI 29.44 kg/m  Wt Readings from Last 3 Encounters:  08/20/20 193 lb 9.6 oz (87.8 kg)  03/12/20 203 lb 9.6 oz (92.4 kg)  02/27/20 204 lb 6.4 oz (92.7 kg)    Diabetic Foot Exam - Simple   No data filed    Lab Results  Component Value Date   WBC 8.3 02/27/2020   HGB 12.0 02/27/2020   HCT 36.1 02/27/2020   PLT 317 02/27/2020   GLUCOSE 102 (H) 02/27/2020   CHOL 279 (H) 01/03/2020   TRIG 212.0 (H) 01/03/2020   HDL 51.00 01/03/2020   LDLDIRECT 181.0 01/03/2020   LDLCALC 198 (H) 07/09/2018   ALT 12 02/27/2020   AST 17 02/27/2020   NA 143 02/27/2020   K 3.9 02/27/2020   CL 100 02/27/2020   CREATININE 0.75 02/27/2020   BUN 14 02/27/2020   CO2 35 (H) 02/27/2020   TSH 1.94 02/27/2020   HGBA1C 7.7 (H) 01/03/2020    Lab Results  Component Value Date   TSH 1.94 02/27/2020   Lab Results  Component Value Date   WBC 8.3 02/27/2020   HGB 12.0 02/27/2020   HCT 36.1 02/27/2020   MCV 73.7 (L) 02/27/2020   PLT 317 02/27/2020   Lab Results  Component Value Date   NA 143 02/27/2020   K 3.9 02/27/2020   CO2 35 (H) 02/27/2020   GLUCOSE 102 (H) 02/27/2020   BUN 14 02/27/2020   CREATININE 0.75  02/27/2020   BILITOT 0.4 02/27/2020   ALKPHOS 54 01/03/2020   AST 17 02/27/2020   ALT 12 02/27/2020   PROT 7.9 02/27/2020   ALBUMIN 4.4 01/03/2020   CALCIUM 10.5 (H) 02/27/2020   ANIONGAP 9 12/04/2017   GFR 90.43 01/03/2020   Lab Results  Component Value Date   CHOL 279 (H) 01/03/2020   Lab Results  Component Value Date   HDL 51.00 01/03/2020   Lab Results  Component Value Date   LDLCALC 198 (H) 07/09/2018   Lab Results  Component Value Date   TRIG 212.0 (H) 01/03/2020   Lab Results  Component Value Date   CHOLHDL 5 01/03/2020   Lab Results  Component Value Date   HGBA1C 7.7 (H) 01/03/2020       Assessment & Plan:   Problem List Items Addressed This Visit    Anemia    Increase leafy greens, consider increased lean red meat and using cast iron cookware. Continue to monitor, report any concerns.      HTN (hypertension) - Primary    At home her systolic BPs running 426S to 184 with the average sitting between 150-170. In the past she always felt her high numbers were related to stress but her stress levels are down now. She is minimizing her sodium and is trying to eat well. Will d/c Atenolol and prescribe Carvedilol 25 mg tabs, 1 tab po bid. Have ordered a Renal ultrasound  to rule out RAS and an echo then she is referred to cardiology for further evaluation..       Relevant Medications   carvedilol (COREG) 25 MG tablet   losartan (COZAAR) 100 MG tablet   Other Relevant Orders   CBC   Comprehensive metabolic panel   TSH   Hyperlipidemia     encouraged heart healthy diet, avoid trans fats, minimize simple carbs and saturated fats. Increase exercise as tolerated      Relevant Medications   carvedilol (COREG) 25 MG tablet   losartan (COZAAR) 100 MG tablet   Other Relevant Orders   Lipid panel   Ambulatory referral to Cardiology   Diabetes mellitus type 2 in obese ([facility])    hgba1c acceptable, minimize simple carbs. Increase exercise as tolerated. Continue  current meds      Relevant Medications   losartan (COZAAR) 100 MG tablet   Other Relevant Orders   Hemoglobin A1c   Ambulatory referral to Cardiology   Left-sided chest wall pain    No recent episodes but will pursue an echocardiogram for further evaluation      Relevant Orders   ECHOCARDIOGRAM COMPLETE   Ambulatory referral to Cardiology   Vitamin D deficiency    Supplement and monitor      Relevant Orders   VITAMIN D 25 Hydroxy (Vit-D Deficiency, Fractures)   High serum vitamin B12   Relevant Orders   Vitamin B12    Other Visit Diagnoses    Resistant hypertension       Relevant Medications   carvedilol (COREG) 25 MG tablet   losartan (COZAAR) 100 MG tablet   Other Relevant Orders   US RENAL ARTERY DUPLEX COMPLETE   Ambulatory referral to Cardiology      I have discontinued Lurdes Henault's cycloSPORINE, hydrochlorothiazide, atenolol, sitaGLIPtin, and rosuvastatin. I have also changed her losartan. Additionally, I am having her start on carvedilol. Lastly, I am having her maintain her multivitamin, Vitamin B 12, aspirin, ONE TOUCH LANCETS, ONE TOUCH BASIC SYSTEM, glucose blood, ferrous sulfate, Restasis, tiZANidine, [place]-Con M20, furosemide, amLODipine, and metFORMIN.  Meds ordered this encounter  Medications  . carvedilol (COREG) 25 MG tablet    Sig: Take 1 tablet (25 mg total) by mouth 2 (two) times daily with a meal.    Dispense:  60 tablet    Refill:  3  . losartan (COZAAR) 100 MG tablet    Sig: Take 1 tablet (100 mg total) by mouth every evening.    Dispense:  90 tablet    Refill:  1    Requested drug refills are authorized, however, the patient needs further evaluation and/or laboratory testing before further refills are given. Ask her to make an appointment for this.     Penni Homans, MD

## 2020-08-20 NOTE — Assessment & Plan Note (Signed)
Supplement and monitor 

## 2020-08-20 NOTE — Patient Instructions (Addendum)
CALL [facility] TO SCHEDULE COLONOSCOPY WITH DR. Marin Comment  [phone] Hypertension, Adult High blood pressure (hypertension) is when the force of blood pumping through the arteries is too strong. The arteries are the blood vessels that carry blood from the heart throughout the body. Hypertension forces the heart to work harder to pump blood and may cause arteries to become narrow or stiff. Untreated or uncontrolled hypertension can cause a heart attack, heart failure, a stroke, kidney disease, and other problems. A blood pressure reading consists of a higher number over a lower number. Ideally, your blood pressure should be below 120/80. The first ("top") number is called the systolic pressure. It is a measure of the pressure in your arteries as your heart beats. The second ("bottom") number is called the diastolic pressure. It is a measure of the pressure in your arteries as the heart relaxes. What are the causes? The exact cause of this condition is not known. There are some conditions that result in or are related to high blood pressure. What increases the risk? Some risk factors for high blood pressure are under your control. The following factors may make you more likely to develop this condition:  Smoking.  Having type 2 diabetes mellitus, high cholesterol, or both.  Not getting enough exercise or physical activity.  Being overweight.  Having too much fat, sugar, calories, or salt (sodium) in your diet.  Drinking too much alcohol. Some risk factors for high blood pressure may be difficult or impossible to change. Some of these factors include:  Having chronic kidney disease.  Having a family history of high blood pressure.  Age. Risk increases with age.  Race. You may be at higher risk if you are African American.  Gender. Men are at higher risk than women before age 28. After age 77, women are at higher risk than men.  Having obstructive sleep apnea.  Stress. What are the  signs or symptoms? High blood pressure may not cause symptoms. Very high blood pressure (hypertensive crisis) may cause:  Headache.  Anxiety.  Shortness of breath.  Nosebleed.  Nausea and vomiting.  Vision changes.  Severe chest pain.  Seizures. How is this diagnosed? This condition is diagnosed by measuring your blood pressure while you are seated, with your arm resting on a flat surface, your legs uncrossed, and your feet flat on the floor. The cuff of the blood pressure monitor will be placed directly against the skin of your upper arm at the level of your heart. It should be measured at least twice using the same arm. Certain conditions can cause a difference in blood pressure between your right and left arms. Certain factors can cause blood pressure readings to be lower or higher than normal for a short period of time:  When your blood pressure is higher when you are in a health care provider's office than when you are at home, this is called white coat hypertension. Most people with this condition do not need medicines.  When your blood pressure is higher at home than when you are in a health care provider's office, this is called masked hypertension. Most people with this condition may need medicines to control blood pressure. If you have a high blood pressure reading during one visit or you have normal blood pressure with other risk factors, you may be asked to:  Return on a different day to have your blood pressure checked again.  Monitor your blood pressure at home for 1 week or longer.  If you are diagnosed with hypertension, you may have other blood or imaging tests to help your health care provider understand your overall risk for other conditions. How is this treated? This condition is treated by making healthy lifestyle changes, such as eating healthy foods, exercising more, and reducing your alcohol intake. Your health care provider may prescribe medicine if lifestyle  changes are not enough to get your blood pressure under control, and if:  Your systolic blood pressure is above 130.  Your diastolic blood pressure is above 80. Your personal target blood pressure may vary depending on your medical conditions, your age, and other factors. Follow these instructions at home: Eating and drinking  Eat a diet that is high in fiber and potassium, and low in sodium, added sugar, and fat. An example eating plan is called the DASH (Dietary Approaches to Stop Hypertension) diet. To eat this way: ? Eat plenty of fresh fruits and vegetables. Try to fill one half of your plate at each meal with fruits and vegetables. ? Eat whole grains, such as whole-wheat pasta, brown rice, or whole-grain bread. Fill about one fourth of your plate with whole grains. ? Eat or drink low-fat dairy products, such as skim milk or low-fat yogurt. ? Avoid fatty cuts of meat, processed or cured meats, and poultry with skin. Fill about one fourth of your plate with lean proteins, such as fish, chicken without skin, beans, eggs, or tofu. ? Avoid pre-made and processed foods. These tend to be higher in sodium, added sugar, and fat.  Reduce your daily sodium intake. Most people with hypertension should eat less than 1,500 mg of sodium a day.  Do not drink alcohol if: ? Your health care provider tells you not to drink. ? You are pregnant, may be pregnant, or are planning to become pregnant.  If you drink alcohol: ? Limit how much you use to:  0-1 drink a day for women.  0-2 drinks a day for men. ? Be aware of how much alcohol is in your drink. In the [place] one drink equals one 12 oz bottle of beer (355 mL), one 5 oz glass of wine (148 mL), or one 1 oz glass of hard liquor (44 mL).   Lifestyle  Work with your health care provider to maintain a healthy body weight or to lose weight. Ask what an ideal weight is for you.  Get at least 30 minutes of exercise most days of the week. Activities may  include walking, swimming, or biking.  Include exercise to strengthen your muscles (resistance exercise), such as Pilates or lifting weights, as part of your weekly exercise routine. Try to do these types of exercises for 30 minutes at least 3 days a week.  Do not use any products that contain nicotine or tobacco, such as cigarettes, e-cigarettes, and chewing tobacco. If you need help quitting, ask your health care provider.  Monitor your blood pressure at home as told by your health care provider.  Keep all follow-up visits as told by your health care provider. This is important.   Medicines  Take over-the-counter and prescription medicines only as told by your health care provider. Follow directions carefully. Blood pressure medicines must be taken as prescribed.  Do not skip doses of blood pressure medicine. Doing this puts you at risk for problems and can make the medicine less effective.  Ask your health care provider about side effects or reactions to medicines that you should watch for. Contact a health care  provider if you:  Think you are having a reaction to a medicine you are taking.  Have headaches that keep coming back (recurring).  Feel dizzy.  Have swelling in your ankles.  Have trouble with your vision. Get help right away if you:  Develop a severe headache or confusion.  Have unusual weakness or numbness.  Feel faint.  Have severe pain in your chest or abdomen.  Vomit repeatedly.  Have trouble breathing. Summary  Hypertension is when the force of blood pumping through your arteries is too strong. If this condition is not controlled, it may put you at risk for serious complications.  Your personal target blood pressure may vary depending on your medical conditions, your age, and other factors. For most people, a normal blood pressure is less than 120/80.  Hypertension is treated with lifestyle changes, medicines, or a combination of both. Lifestyle changes  include losing weight, eating a healthy, low-sodium diet, exercising more, and limiting alcohol. This information is not intended to replace advice given to you by your health care provider. Make sure you discuss any questions you have with your health care provider. Document Revised: 02/10/2018 Document Reviewed: 02/10/2018 Elsevier Patient Education  2021 [employer].

## 2020-08-20 NOTE — Assessment & Plan Note (Signed)
hgba1c acceptable, minimize simple carbs. Increase exercise as tolerated. Continue current meds 

## 2020-08-20 NOTE — Assessment & Plan Note (Signed)
Increase leafy greens, consider increased lean red meat and using cast iron cookware. Continue to monitor, report any concerns 

## 2020-08-20 NOTE — Assessment & Plan Note (Signed)
encouraged heart healthy diet, avoid trans fats, minimize simple carbs and saturated fats. Increase exercise as tolerated 

## 2020-08-20 NOTE — Assessment & Plan Note (Signed)
At home her systolic BPs running 161W to 184 with the average sitting between 150-170. In the past she always felt her high numbers were related to stress but her stress levels are down now. She is minimizing her sodium and is trying to eat well. Will d/c Atenolol and prescribe Carvedilol 25 mg tabs, 1 tab po bid. Have ordered a Renal ultrasound to rule out RAS and an echo then she is referred to cardiology for further evaluation.Marland Kitchen

## 2020-08-20 NOTE — Assessment & Plan Note (Signed)
No recent episodes but will pursue an echocardiogram for further evaluation

## 2020-08-21 ENCOUNTER — Telehealth: Payer: Self-pay | Admitting: *Deleted

## 2020-08-21 LAB — LIPID PANEL
Cholesterol: 264 mg/dL — ABNORMAL HIGH (ref 0–200)
HDL: 51.1 mg/dL (ref >39.00)
LDL Cholesterol: 183 mg/dL — ABNORMAL HIGH (ref 0–99)
NonHDL: 212.53
Total CHOL/HDL Ratio: 5
Triglycerides: 146 mg/dL (ref 0.0–149.0)
VLDL: 29.2 mg/dL (ref 0.0–40.0)

## 2020-08-21 LAB — COMPREHENSIVE METABOLIC PANEL
ALT: 9 U/L (ref 0–35)
AST: 12 U/L (ref 0–37)
Albumin: 4.3 g/dL (ref 3.5–5.2)
Alkaline Phosphatase: 52 U/L (ref 39–117)
BUN: 14 mg/dL (ref 6–23)
CO2: 31 mEq/L (ref 19–32)
Calcium: 9.9 mg/dL (ref 8.4–10.5)
Chloride: 102 mEq/L (ref 96–112)
Creatinine, Ser: 0.68 mg/dL (ref 0.40–1.20)
GFR: 86.68 mL/min (ref >60.00)
Glucose, Bld: 89 mg/dL (ref 70–99)
Potassium: 4.2 mEq/L (ref 3.5–5.1)
Sodium: 142 mEq/L (ref 135–145)
Total Bilirubin: 0.3 mg/dL (ref 0.2–1.2)
Total Protein: 7.6 g/dL (ref 6.0–8.3)

## 2020-08-21 LAB — CBC
HCT: 37 % (ref 36.0–46.0)
Hemoglobin: 12 g/dL (ref 12.0–15.0)
MCHC: 32.4 g/dL (ref 30.0–36.0)
MCV: 74.3 fl — ABNORMAL LOW (ref 78.0–100.0)
Platelets: 272 10*3/uL (ref 150.0–400.0)
RBC: 4.99 Mil/uL (ref 3.87–5.11)
RDW: 13.7 % (ref 11.5–15.5)
WBC: 8 10*3/uL (ref 4.0–10.5)

## 2020-08-21 LAB — TSH: TSH: 2.05 u[IU]/mL (ref 0.35–4.50)

## 2020-08-21 LAB — VITAMIN D 25 HYDROXY (VIT D DEFICIENCY, FRACTURES): VITD: 43.96 ng/mL (ref 30.00–100.00)

## 2020-08-21 LAB — VITAMIN B12: Vitamin B-12: 986 pg/mL — ABNORMAL HIGH (ref 211–911)

## 2020-08-21 LAB — HEMOGLOBIN A1C: Hgb A1c MFr Bld: 6.6 % — ABNORMAL HIGH (ref 4.6–6.5)

## 2020-08-21 NOTE — Telephone Encounter (Signed)
Error

## 2020-08-22 ENCOUNTER — Other Ambulatory Visit: Payer: Self-pay | Admitting: *Deleted

## 2020-08-22 MED ORDER — ROSUVASTATIN CALCIUM 5 MG PO TABS: 5.0000 mg | ORAL_TABLET | ORAL | 3 refills | Status: DC

## 2020-08-22 MED ORDER — METFORMIN HCL ER 500 MG PO TB24: 500.0000 mg | ORAL_TABLET | Freq: Every day | ORAL | 1 refills | Status: DC

## 2020-08-22 MED ORDER — FUROSEMIDE 20 MG PO TABS: 40.0000 mg | ORAL_TABLET | Freq: Every morning | ORAL | 3 refills | Status: DC

## 2020-09-03 ENCOUNTER — Ambulatory Visit: Payer: Medicare PPO | Admitting: Family Medicine

## 2020-09-04 DIAGNOSIS — I10 Essential (primary) hypertension: Secondary | ICD-10-CM | POA: Insufficient documentation

## 2020-09-05 ENCOUNTER — Encounter: Payer: Self-pay | Admitting: Cardiology

## 2020-09-05 ENCOUNTER — Ambulatory Visit: Payer: Medicare PPO | Admitting: Cardiology

## 2020-09-05 ENCOUNTER — Other Ambulatory Visit: Payer: Self-pay

## 2020-09-05 VITALS — BP 130/84 | HR 54 | Ht 68.0 in | Wt 197.0 lb

## 2020-09-05 DIAGNOSIS — E782 Mixed hyperlipidemia: Secondary | ICD-10-CM

## 2020-09-05 DIAGNOSIS — R0600 Dyspnea, unspecified: Secondary | ICD-10-CM | POA: Diagnosis not present

## 2020-09-05 DIAGNOSIS — I10 Essential (primary) hypertension: Secondary | ICD-10-CM

## 2020-09-05 DIAGNOSIS — R5383 Other fatigue: Secondary | ICD-10-CM | POA: Insufficient documentation

## 2020-09-05 DIAGNOSIS — R9431 Abnormal electrocardiogram [ECG] [EKG]: Secondary | ICD-10-CM | POA: Diagnosis not present

## 2020-09-05 DIAGNOSIS — R0602 Shortness of breath: Secondary | ICD-10-CM | POA: Diagnosis not present

## 2020-09-05 DIAGNOSIS — R600 Localized edema: Secondary | ICD-10-CM | POA: Insufficient documentation

## 2020-09-05 DIAGNOSIS — R001 Bradycardia, unspecified: Secondary | ICD-10-CM

## 2020-09-05 DIAGNOSIS — R0609 Other forms of dyspnea: Secondary | ICD-10-CM | POA: Insufficient documentation

## 2020-09-05 DIAGNOSIS — E1100 Type 2 diabetes mellitus with hyperosmolarity without nonketotic hyperglycemic-hyperosmolar coma (NKHHC): Secondary | ICD-10-CM

## 2020-09-05 MED ORDER — POTASSIUM CHLORIDE CRYS ER 20 MEQ PO TBCR: EXTENDED_RELEASE_TABLET | ORAL | 3 refills | Status: DC

## 2020-09-05 MED ORDER — FUROSEMIDE 40 MG PO TABS: ORAL_TABLET | ORAL | 3 refills | Status: DC

## 2020-09-05 NOTE — Patient Instructions (Signed)
Medication Instructions:  Your physician has recommended you make the following change in your medication:  START: Lasix 40 mg twice daily every other day. On the odd days 40 mg once daily.  START: Potassium 20 meq twice daily every other day. On the odd days 20 meq once daily. *If you need a refill on your cardiac medications before your next appointment, please call your pharmacy*   Lab Work: Your physician recommends that you return for lab work:  3-7 days before CT: BMET, Mag If you have labs (blood work) drawn today and your tests are completely normal, you will receive your results only by: Marland Kitchen MyChart Message (if you have MyChart) OR . A paper copy in the mail If you have any lab test that is abnormal or we need to change your treatment, we will call you to review the results.   Testing/Procedures: Your cardiac CT will be scheduled at:  [facility] [address] [place], [place] [address] [phone]   If scheduled at [facility], please arrive at the [facility] main entrance (entrance A) of [facility] 30 minutes prior to test start time. Proceed to the [facility] Radiology Department (first floor) to check-in and test prep.   Please follow these instructions carefully (unless otherwise directed):   On the Night Before the Test: . Be sure to Drink plenty of water. . Do not consume any caffeinated/decaffeinated beverages or chocolate 12 hours prior to your test. . Do not take any antihistamines 12 hours prior to your test.  On the Day of the Test: . Drink plenty of water until 1 hour prior to the test. . Do not eat any food 4 hours prior to the test. . You may take your regular medications prior to the test.  . HOLD Furosemide morning of the test. . FEMALES- please wear underwire-free bra if available       After the Test: . Drink plenty of water. . After receiving IV contrast, you may experience a mild flushed feeling. This is  normal. . On occasion, you may experience a mild rash up to 24 hours after the test. This is not dangerous. If this occurs, you can take Benadryl 25 mg and increase your fluid intake. . If you experience trouble breathing, this can be serious. If it is severe call 911 IMMEDIATELY. If it is mild, please call our office. . If you take any of these medications: Glipizide/Metformin, Avandament, Glucavance, please do not take 48 hours after completing test unless otherwise instructed.   Once we have confirmed authorization from your insurance company, we will call you to set up a date and time for your test. Based on how quickly your insurance processes prior authorizations requests, please allow up to 4 weeks to be contacted for scheduling your Cardiac CT appointment. Be advised that routine Cardiac CT appointments could be scheduled as many as 8 weeks after your provider has ordered it.  For non-scheduling related questions, please contact the cardiac imaging nurse navigator should you have any questions/concerns: Marchia Bond, Cardiac Imaging Nurse Navigator Gordy Clement, Cardiac Imaging Nurse Navigator [facility] Direct Office Dial: [phone]   For scheduling needs, including cancellations and rescheduling, please call [place], [phone].     Follow-Up: At [facility], you and your health needs are our priority.  As part of our continuing mission to provide you with exceptional heart care, we have created designated Provider Care Teams.  These Care  Teams include your primary Cardiologist (physician) and Advanced Practice Providers (APPs -  Physician Assistants and Nurse Practitioners) who all work together to provide you with the care you need, when you need it.  We recommend signing up for the patient portal called "MyChart".  Sign up information is provided on this After Visit Summary.  MyChart is used to connect with patients for Virtual Visits  (Telemedicine).  Patients are able to view lab/test results, encounter notes, upcoming appointments, etc.  Non-urgent messages can be sent to your provider as well.   To learn more about what you can do with MyChart, go to [email or web address].    Your next appointment:   8 week(s)  The format for your next appointment:   In Person  Provider:   Berniece Salines, DO   Other Instructions

## 2020-09-05 NOTE — Progress Notes (Signed)
Cardiology Office Note:    Date:  09/05/2020   ID:  Morgan Medina, DOB [birth year], MRN [id number]  PCP:  Mosie Lukes, MD  Cardiologist:  Berniece Salines, DO  Electrophysiologist:  None   Referring MD: Mosie Lukes, MD   I am having shortness of breath and fatigue.  History of Present Illness:    Morgan Medina is a 75 y.o. female with a hx of cerebral aneurysm reported in 2006, diabetes mellitus, hypertension, hyperlipidemia is here today at the request of her PCP.  The patient tells me that she is active and over the last several months she has noticed that she has been experiencing shortness of breath on exertion.  This was never the case.  She has steps in her home and was able to go up and down stairs without problem but recently at times when she go up to stair she has significant shortness of breath that she sometimes she has to rest.  She does admit that she has been experiencing some fatigue and this is also new.  She has not had any chest pain.  She has not had any dizziness or lightheadedness. The patient is here today with her daughter.  Past Medical History:  Diagnosis Date  . Anemia   . Cerebral aneurysm    h/o in 2006  . Dacrocystitis 07/19/2015   right  . Diabetes mellitus type 2 in obese ([place]) 11/05/2014  . GERD (gastroesophageal reflux disease) 04/17/2016  . Hyperlipidemia   . Hypertension   . Osteoarthritis of left hip 12/02/2015  . Osteopenia 11/22/2015  . Overweight     Past Surgical History:  Procedure Laterality Date  . ABDOMINAL HYSTERECTOMY    . CEREBRAL ANEURYSM REPAIR      Current Medications: Current Meds  Medication Sig  . amLODipine (NORVASC) 5 MG tablet TAKE 1 TABLET BY MOUTH EVERY DAY  . aspirin 81 MG tablet Takes every other day  . Blood Glucose Monitoring Suppl ([place]) W/DEVICE KIT Use to check blood sugar. DX. E11.9  . carvedilol (COREG) 25 MG tablet Take 1 tablet (25 mg total) by mouth 2 (two) times daily with a meal.  .  Cyanocobalamin (VITAMIN B 12) 250 MCG LOZG Take by mouth.  . ferrous sulfate 325 (65 FE) MG tablet TAKE 1 TABLET BY MOUTH EVERY DAY WITH BREAKFAST  . furosemide (LASIX) 40 MG tablet 40 mg twice daily every other day. On the odd days 40 mg once daily.  Marland Kitchen glucose blood (ONE TOUCH ULTRA TEST) test strip Use as idirected once daily.  DX E11.9 (Patient taking differently: Use as idirected once daily.  DX E11.9)  . losartan (COZAAR) 100 MG tablet Take 1 tablet (100 mg total) by mouth every evening.  . metFORMIN (GLUCOPHAGE-XR) 500 MG 24 hr tablet Take 1 tablet (500 mg total) by mouth daily.  . Multiple Vitamin (MULTIVITAMIN) capsule Take 1 capsule by mouth daily.  . ONE TOUCH LANCETS MISC Use as directed once daily to check blood sugar. DX: E11.9  . potassium chloride SA (KLOR-CON M20) 20 MEQ tablet 20 meq twice daily every other day. On the odd days 20 meq once daily.  . RESTASIS 0.05 % ophthalmic emulsion INSTILL 1 DROP INTO BOTH EYES TWICE A DAY  . rosuvastatin (CRESTOR) 5 MG tablet Take 1 tablet (5 mg total) by mouth every other day.  . [DISCONTINUED] furosemide (LASIX) 20 MG tablet Take 2 tablets (40 mg total) by mouth every morning.  . [DISCONTINUED] KLOR-CON  M20 20 MEQ tablet TAKE 1 TABLET BY MOUTH EVERY DAY     Allergies:   Patient has no known allergies.   Social History   Socioeconomic History  . Marital status: Widowed    Spouse name: Not on file  . Number of children: Not on file  . Years of education: Not on file  . Highest education level: Not on file  Occupational History  . Not on file  Tobacco Use  . Smoking status: Never Smoker  . Smokeless tobacco: Never Used  Substance and Sexual Activity  . Alcohol use: No  . Drug use: No  . Sexual activity: Never    Comment: lives with self, no dietary restrictions. works as a Theme park manager, retired from state employment  Other Topics Concern  . Not on file  Social History Narrative  . Not on file   Social Determinants of Health    Financial Resource Strain: Low Risk   . Difficulty of Paying Living Expenses: Not hard at all  Food Insecurity: No Food Insecurity  . Worried About Charity fundraiser in the Last Year: Never true  . Ran Out of Food in the Last Year: Never true  Transportation Needs: No Transportation Needs  . Lack of Transportation (Medical): No  . Lack of Transportation (Non-Medical): No  Physical Activity: Not on file  Stress: Not on file  Social Connections: Not on file     Family History: The patient's family history includes Alzheimer's disease in her mother and paternal grandmother; Cancer in her maternal aunt; Cerebral aneurysm in her father; Diabetes in her maternal grandmother; Stroke in her maternal grandfather and paternal grandfather.  ROS:   Review of Systems  Constitution: Negative for decreased appetite, fever and weight gain.  HENT: Negative for congestion, ear discharge, hoarse voice and sore throat.   Eyes: Negative for discharge, redness, vision loss in right eye and visual halos.  Cardiovascular: Negative for chest pain, dyspnea on exertion, leg swelling, orthopnea and palpitations.  Respiratory: Negative for cough, hemoptysis, shortness of breath and snoring.   Endocrine: Negative for heat intolerance and polyphagia.  Hematologic/Lymphatic: Negative for bleeding problem. Does not bruise/bleed easily.  Skin: Negative for flushing, nail changes, rash and suspicious lesions.  Musculoskeletal: Negative for arthritis, joint pain, muscle cramps, myalgias, neck pain and stiffness.  Gastrointestinal: Negative for abdominal pain, bowel incontinence, diarrhea and excessive appetite.  Genitourinary: Negative for decreased libido, genital sores and incomplete emptying.  Neurological: Negative for brief paralysis, focal weakness, headaches and loss of balance.  Psychiatric/Behavioral: Negative for altered mental status, depression and suicidal ideas.  Allergic/Immunologic: Negative for HIV  exposure and persistent infections.    EKGs/Labs/Other Studies Reviewed:    The following studies were reviewed today:   EKG:  The ekg ordered today demonstrates sinus bradycardia, mildly prolonged PR interval with ST segment changes suggesting anterior wall in possibly a lateral wall infarction.  Recent Labs: 09/23/2019: Pro B Natriuretic peptide (BNP) 44.0 08/20/2020: ALT 9; BUN 14; Creatinine, Ser 0.68; Hemoglobin 12.0; Platelets 272.0; Potassium 4.2; Sodium 142; TSH 2.05  Recent Lipid Panel    Component Value Date/Time   CHOL 264 (H) 08/20/2020 1627   TRIG 146.0 08/20/2020 1627   HDL 51.10 08/20/2020 1627   CHOLHDL 5 08/20/2020 1627   VLDL 29.2 08/20/2020 1627   LDLCALC 183 (H) 08/20/2020 1627   LDLDIRECT 181.0 01/03/2020 1156    Physical Exam:    VS:  BP 130/84 (BP Location: Right Arm)   Pulse (!) 54  Ht _0  (1.727 m)   Wt 197 lb (89.4 kg)   SpO2 94%   BMI 29.95 kg/m     Wt Readings from Last 3 Encounters:  09/05/20 197 lb (89.4 kg)  08/20/20 193 lb 9.6 oz (87.8 kg)  03/12/20 203 lb 9.6 oz (92.4 kg)     GEN: Well nourished, well developed in no acute distress HEENT: Normal NECK: No JVD; No carotid bruits LYMPHATICS: No lymphadenopathy CARDIAC: S1S2 noted,RRR, no murmurs, rubs, gallops RESPIRATORY:  Clear to auscultation without rales, wheezing or rhonchi  ABDOMEN: Soft, non-tender, non-distended, +bowel sounds, no guarding. EXTREMITIES: bilateral leg edema +2, No cyanosis, no clubbing MUSCULOSKELETAL:  No deformity  SKIN: Warm and dry NEUROLOGIC:  Alert and oriented x 3, non-focal PSYCHIATRIC:  Normal affect, good insight  ASSESSMENT:    1. Shortness of breath   2. DOE (dyspnea on exertion)   3. Bilateral leg edema   4. Abnormal EKG   5. Hypertension, unspecified type   6. Fatigue, unspecified type   7. Bradycardia, sinus   8. Type 2 diabetes mellitus with hyperosmolarity without coma, without long-term current use of insulin ([place])   9. Mixed  hyperlipidemia    PLAN:     Her shortness of breath and abnormal EKG is concerning patient does have intermediate risk for coronary artery disease would not like to do is pursue an ischemic evaluation in this patient.  Shared decision a coronary CTA at this time is appropriate.  I have discussed with the patient about the testing.  The patient has no IV contrast allergy and is agreeable to proceed with this test.  She also need an echocardiogram which has been ordered by her PCP to fully assess her fatigue as well as shortness of breath for any structural and valvular abnormalities.  We will follow up on these results.  In terms of the sinus bradycardia she is on beta-blocker.  This could be contributing to her fatigue but were going to get more information and if everything seems to be normal and patient is still fatigue we will cut back on her beta-blocker.  Blood pressure is acceptable, continue with current antihypertensive regimen. She has a [place] of renal artery pending which was ordered by her pcp.  Hyperlipidemia - continue with current statin medication.  This is being managed by his primary care doctor.  No adjustments for antidiabetic medications were made today.  She is got significant bilateral leg edema I like to do is increase her Lasix to 40 mg twice daily every other day and then Lasix 40 mg on other days.  Her potassium supplement will be increased accordingly as well.  The patient is in agreement with the above plan. The patient left the office in stable condition.  The patient will follow up in 8 weeks   Medication Adjustments/Labs and Tests Ordered: Current medicines are reviewed at length with the patient today.  Concerns regarding medicines are outlined above.  8 weeks.  Orders Placed This Encounter  Procedures  . CT CORONARY MORPH W/CTA COR W/SCORE W/CA W/CM &/OR WO/CM  . CT CORONARY FRACTIONAL FLOW RESERVE DATA PREP  . CT CORONARY FRACTIONAL FLOW RESERVE FLUID  ANALYSIS  . Basic metabolic panel  . Magnesium  . EKG 12-Lead   Meds ordered this encounter  Medications  . furosemide (LASIX) 40 MG tablet    Sig: 40 mg twice daily every other day. On the odd days 40 mg once daily.    Dispense:  90 tablet  Refill:  3  . potassium chloride SA (KLOR-CON M20) 20 MEQ tablet    Sig: 20 meq twice daily every other day. On the odd days 20 meq once daily.    Dispense:  90 tablet    Refill:  3    Patient Instructions  Medication Instructions:  Your physician has recommended you make the following change in your medication:  START: Lasix 40 mg twice daily every other day. On the odd days 40 mg once daily.  START: Potassium 20 meq twice daily every other day. On the odd days 20 meq once daily. *If you need a refill on your cardiac medications before your next appointment, please call your pharmacy*   Lab Work: Your physician recommends that you return for lab work:  3-7 days before CT: BMET, Mag If you have labs (blood work) drawn today and your tests are completely normal, you will receive your results only by: Marland Kitchen MyChart Message (if you have MyChart) OR . A paper copy in the mail If you have any lab test that is abnormal or we need to change your treatment, we will call you to review the results.   Testing/Procedures: Your cardiac CT will be scheduled at:  [facility] [address] [place], [place] [address] [phone]   If scheduled at [facility], please arrive at the [facility] main entrance (entrance A) of [facility] 30 minutes prior to test start time. Proceed to the [facility] Radiology Department (first floor) to check-in and test prep.   Please follow these instructions carefully (unless otherwise directed):   On the Night Before the Test: . Be sure to Drink plenty of water. . Do not consume any caffeinated/decaffeinated beverages or chocolate 12 hours prior to your test. . Do not take any  antihistamines 12 hours prior to your test.  On the Day of the Test: . Drink plenty of water until 1 hour prior to the test. . Do not eat any food 4 hours prior to the test. . You may take your regular medications prior to the test.  . HOLD Furosemide morning of the test. . FEMALES- please wear underwire-free bra if available       After the Test: . Drink plenty of water. . After receiving IV contrast, you may experience a mild flushed feeling. This is normal. . On occasion, you may experience a mild rash up to 24 hours after the test. This is not dangerous. If this occurs, you can take Benadryl 25 mg and increase your fluid intake. . If you experience trouble breathing, this can be serious. If it is severe call 911 IMMEDIATELY. If it is mild, please call our office. . If you take any of these medications: Glipizide/Metformin, Avandament, Glucavance, please do not take 48 hours after completing test unless otherwise instructed.   Once we have confirmed authorization from your insurance company, we will call you to set up a date and time for your test. Based on how quickly your insurance processes prior authorizations requests, please allow up to 4 weeks to be contacted for scheduling your Cardiac CT appointment. Be advised that routine Cardiac CT appointments could be scheduled as many as 8 weeks after your provider has ordered it.  For non-scheduling related questions, please contact the cardiac imaging nurse navigator should you have any questions/concerns: Marchia Bond, Cardiac Imaging Nurse Navigator Gordy Clement, Cardiac Imaging Nurse Navigator [place] [facility] Direct Office Dial: [phone]  For scheduling needs, including cancellations and rescheduling, please call [place], [phone].     Follow-Up: At [facility], you and your health needs are our priority.  As part of our continuing mission to provide you with exceptional heart care, we have  created designated Provider Care Teams.  These Care Teams include your primary Cardiologist (physician) and Advanced Practice Providers (APPs -  Physician Assistants and Nurse Practitioners) who all work together to provide you with the care you need, when you need it.  We recommend signing up for the patient portal called "MyChart".  Sign up information is provided on this After Visit Summary.  MyChart is used to connect with patients for Virtual Visits (Telemedicine).  Patients are able to view lab/test results, encounter notes, upcoming appointments, etc.  Non-urgent messages can be sent to your provider as well.   To learn more about what you can do with MyChart, go to [email or web address].    Your next appointment:   8 week(s)  The format for your next appointment:   In Person  Provider:   Berniece Salines, DO   Other Instructions      Adopting a Healthy Lifestyle.  Know what a healthy weight is for you (roughly BMI <25) and aim to maintain this   Aim for 7+ servings of fruits and vegetables daily   65-80+ fluid ounces of water or unsweet tea for healthy kidneys   Limit to max 1 drink of alcohol per day; avoid smoking/tobacco   Limit animal fats in diet for cholesterol and heart health - choose grass fed whenever available   Avoid highly processed foods, and foods high in saturated/trans fats   Aim for low stress - take time to unwind and care for your mental health   Aim for 150 min of moderate intensity exercise weekly for heart health, and weights twice weekly for bone health   Aim for 7-9 hours of sleep daily   When it comes to diets, agreement about the perfect plan isnt easy to find, even among the experts. Experts at the [employer] developed an idea known as the Healthy Eating Plate. Just imagine a plate divided into logical, healthy portions.   The emphasis is on diet quality:   Load up on vegetables and fruits - one-half of your plate:  Aim for color and variety, and remember that potatoes dont count.   Go for whole grains - one-quarter of your plate: Whole wheat, barley, wheat berries, quinoa, oats, brown rice, and foods made with them. If you want pasta, go with whole wheat pasta.   Protein power - one-quarter of your plate: Fish, chicken, beans, and nuts are all healthy, versatile protein sources. Limit red meat.   The diet, however, does go beyond the plate, offering a few other suggestions.   Use healthy plant oils, such as olive, canola, soy, corn, sunflower and peanut. Check the labels, and avoid partially hydrogenated oil, which have unhealthy trans fats.   If youre thirsty, drink water. Coffee and tea are good in moderation, but skip sugary drinks and limit milk and dairy products to one or two daily servings.   The type of carbohydrate in the diet is more important than the amount. Some sources of carbohydrates, such as vegetables, fruits, whole grains, and beans-are healthier than others.   Finally, stay active  Signed, Berniece Salines, DO  09/05/2020 7:14 PM    [facility]

## 2020-09-14 ENCOUNTER — Telehealth (HOSPITAL_COMMUNITY): Payer: Self-pay | Admitting: Emergency Medicine

## 2020-09-14 NOTE — Telephone Encounter (Signed)
Reaching out to patient to offer assistance regarding upcoming cardiac imaging study; pt verbalizes understanding of appt date/time, parking situation and where to check in, pre-test NPO status and medications ordered, and verified current allergies; name and call back number provided for further questions should they arise Leslieanne Cobarrubias RN Navigator Cardiac Imaging [facility] [phone] office [phone] cell 

## 2020-09-14 NOTE — Telephone Encounter (Signed)
Attempted to call patient regarding upcoming cardiac CT appointment. °Left message on voicemail with name and callback number °Sahir Tolson RN Navigator Cardiac Imaging °[facility] °[phone] Office °[phone] Cell ° °

## 2020-09-16 ENCOUNTER — Other Ambulatory Visit: Payer: Self-pay | Admitting: Family Medicine

## 2020-09-17 ENCOUNTER — Ambulatory Visit (HOSPITAL_COMMUNITY)
Admission: RE | Admit: 2020-09-17 | Discharge: 2020-09-17 | Disposition: A | Discharge status: Home / Self Care | Payer: Medicare PPO | Source: Ambulatory Visit | Attending: Cardiology | Admitting: Cardiology

## 2020-09-17 ENCOUNTER — Other Ambulatory Visit: Payer: Self-pay

## 2020-09-17 ENCOUNTER — Encounter (HOSPITAL_COMMUNITY): Payer: Self-pay

## 2020-09-17 DIAGNOSIS — R0600 Dyspnea, unspecified: Secondary | ICD-10-CM | POA: Diagnosis not present

## 2020-09-17 DIAGNOSIS — R9431 Abnormal electrocardiogram [ECG] [EKG]: Secondary | ICD-10-CM | POA: Insufficient documentation

## 2020-09-17 DIAGNOSIS — R0609 Other forms of dyspnea: Secondary | ICD-10-CM

## 2020-09-17 MED ORDER — NITROGLYCERIN 0.4 MG SL SUBL
0.8000 mg | SUBLINGUAL_TABLET | Freq: Once | SUBLINGUAL | Status: AC
  Administered 2020-09-17: 0.8 mg via SUBLINGUAL

## 2020-09-17 MED ORDER — NITROGLYCERIN 0.4 MG SL SUBL
SUBLINGUAL_TABLET | SUBLINGUAL | Status: AC
  Filled 2020-09-17: qty 2

## 2020-09-17 MED ORDER — IOHEXOL 350 MG/ML SOLN
80.0000 mL | Freq: Once | INTRAVENOUS | Status: AC | PRN
  Administered 2020-09-17: 80 mL via INTRAVENOUS

## 2020-09-20 ENCOUNTER — Telehealth: Payer: Self-pay

## 2020-09-20 NOTE — Telephone Encounter (Signed)
-----   Message from Berniece Salines, DO sent at 09/19/2020  9:11 AM EDT ----- Your coronary calcium score was 208 you have mild nonobstructive coronary artery disease.  For now we will just continue with medical management there is no need for any further procedures in terms of your coronary arteries.  Your noncardiac portion shows some dilatation of your pulmonary arteries.  Suggesting that you may have some pulmonary artery hypertension.  I explained to you what this means at your next visit.

## 2020-09-20 NOTE — Telephone Encounter (Signed)
Spoke with patient regarding results and recommendation.  Patient verbalizes understanding and is agreeable to plan of care. Advised patient to call back with any issues or concerns.  

## 2020-09-20 NOTE — Telephone Encounter (Signed)
Left message on patients voicemail to please return our call.   

## 2020-09-21 ENCOUNTER — Other Ambulatory Visit: Payer: Self-pay

## 2020-09-21 ENCOUNTER — Ambulatory Visit (HOSPITAL_BASED_OUTPATIENT_CLINIC_OR_DEPARTMENT_OTHER)
Admission: RE | Admit: 2020-09-21 | Discharge: 2020-09-21 | Disposition: A | Discharge status: Home / Self Care | Payer: Medicare PPO | Source: Ambulatory Visit | Attending: Family Medicine | Admitting: Family Medicine

## 2020-09-21 DIAGNOSIS — R0789 Other chest pain: Secondary | ICD-10-CM

## 2020-09-21 LAB — ECHOCARDIOGRAM COMPLETE
Area-P 1/2: 2.96 cm2
S' Lateral: 2.67 cm

## 2020-10-29 ENCOUNTER — Ambulatory Visit: Payer: Medicare PPO | Admitting: Cardiology

## 2020-11-15 ENCOUNTER — Other Ambulatory Visit: Payer: Self-pay | Admitting: Family Medicine

## 2020-11-19 ENCOUNTER — Ambulatory Visit (INDEPENDENT_AMBULATORY_CARE_PROVIDER_SITE_OTHER): Payer: Medicare PPO

## 2020-11-19 ENCOUNTER — Ambulatory Visit: Payer: Medicare PPO | Admitting: Cardiology

## 2020-11-19 ENCOUNTER — Encounter: Payer: Self-pay | Admitting: Cardiology

## 2020-11-19 ENCOUNTER — Other Ambulatory Visit: Payer: Self-pay

## 2020-11-19 VITALS — BP 136/70 | HR 64 | Temp 97.8°F | Resp 16 | Ht 68.0 in | Wt 198.6 lb

## 2020-11-19 VITALS — BP 114/70 | HR 64 | Ht 68.0 in | Wt 196.0 lb

## 2020-11-19 DIAGNOSIS — Z0000 Encounter for general adult medical examination without abnormal findings: Secondary | ICD-10-CM

## 2020-11-19 DIAGNOSIS — I10 Essential (primary) hypertension: Secondary | ICD-10-CM | POA: Diagnosis not present

## 2020-11-19 DIAGNOSIS — I2510 Atherosclerotic heart disease of native coronary artery without angina pectoris: Secondary | ICD-10-CM | POA: Diagnosis not present

## 2020-11-19 DIAGNOSIS — Z1211 Encounter for screening for malignant neoplasm of colon: Secondary | ICD-10-CM | POA: Diagnosis not present

## 2020-11-19 DIAGNOSIS — E1100 Type 2 diabetes mellitus with hyperosmolarity without nonketotic hyperglycemic-hyperosmolar coma (NKHHC): Secondary | ICD-10-CM | POA: Diagnosis not present

## 2020-11-19 DIAGNOSIS — R600 Localized edema: Secondary | ICD-10-CM

## 2020-11-19 DIAGNOSIS — I5189 Other ill-defined heart diseases: Secondary | ICD-10-CM

## 2020-11-19 MED ORDER — FUROSEMIDE 40 MG PO TABS: 40.0000 mg | ORAL_TABLET | Freq: Two times a day (BID) | ORAL | 3 refills | Status: DC

## 2020-11-19 NOTE — Progress Notes (Signed)
Subjective:   Kaniah Rizzolo is a 75 y.o. female who presents for Medicare Annual (Subsequent) preventive examination.  Review of Systems     Cardiac Risk Factors include: advanced age (>25mn, >>48women);hypertension;diabetes mellitus;dyslipidemia;obesity (BMI >30kg/m2);sedentary lifestyle     Objective:    Today's Vitals   11/19/20 1549  BP: 136/70  Pulse: 64  Resp: 16  Temp: 97.8 F (36.6 C)  TempSrc: Temporal  SpO2: 96%  Weight: 198 lb 9.6 oz (90.1 kg)  Height: _0  (1.727 m)   Body mass index is 30.2 kg/m.  Advanced Directives 11/19/2020 11/08/2019 10/26/2018 12/04/2017 10/22/2017 04/17/2016 07/04/2014  Does Patient Have a Medical Advance Directive? _1  No No  Does patient want to make changes to medical advance directive? - - No - Patient declined - - - -  Would patient like information on creating a medical advance directive? Yes (MAU/Ambulatory/Procedural Areas - Information given) No - Patient declined - - Yes (MAU/Ambulatory/Procedural Areas - Information given) No - patient declined information -    Current Medications (verified) Outpatient Encounter Medications as of 11/19/2020  Medication Sig  . amLODipine (NORVASC) 5 MG tablet TAKE 1 TABLET BY MOUTH EVERY DAY  . aspirin 81 MG tablet Takes every other day  . Blood Glucose Monitoring Suppl ([place] W/DEVICE KIT Use to check blood sugar. DX. E11.9  . carvedilol (COREG) 25 MG tablet TAKE 1 TABLET (25 MG TOTAL) BY MOUTH 2 (TWO) TIMES DAILY WITH A MEAL.  .Marland KitchenCyanocobalamin (VITAMIN B 12) 250 MCG LOZG Take by mouth.  . ferrous sulfate 325 (65 FE) MG tablet TAKE 1 TABLET BY MOUTH EVERY DAY WITH BREAKFAST  . furosemide (LASIX) 40 MG tablet Take 1 tablet (40 mg total) by mouth 2 (two) times daily. 40 mg twice daily every other day. On the odd days 40 mg once daily.  .Marland Kitchenlosartan (COZAAR) 100 MG tablet Take 1 tablet (100 mg total) by mouth every evening.  . metFORMIN (GLUCOPHAGE-XR) 500 MG 24 hr tablet  Take 1 tablet (500 mg total) by mouth daily.  . Multiple Vitamin (MULTIVITAMIN) capsule Take 1 capsule by mouth daily.  . ONE TOUCH LANCETS MISC Use as directed once daily to check blood sugar. DX: E11.9  . potassium chloride SA (KLOR-CON M20) 20 MEQ tablet 20 meq twice daily every other day. On the odd days 20 meq once daily.  . RESTASIS 0.05 % ophthalmic emulsion INSTILL 1 DROP INTO BOTH EYES TWICE A DAY  . rosuvastatin (CRESTOR) 5 MG tablet Take 1 tablet (5 mg total) by mouth every other day.  . [DISCONTINUED] furosemide (LASIX) 40 MG tablet 40 mg twice daily every other day. On the odd days 40 mg once daily. (Patient taking differently: Take 40 mg by mouth 2 (two) times daily. 40 mg twice daily every other day. On the odd days 40 mg once daily.)   No facility-administered encounter medications on file as of 11/19/2020.    Allergies (verified) Patient has no known allergies.   History: Past Medical History:  Diagnosis Date  . Anemia   . Cerebral aneurysm    h/o in 2006  . Dacrocystitis 07/19/2015   right  . Diabetes mellitus type 2 in obese ([place] 11/05/2014  . GERD (gastroesophageal reflux disease) 04/17/2016  . Hyperlipidemia   . Hypertension   . Osteoarthritis of left hip 12/02/2015  . Osteopenia 11/22/2015  . Overweight    Past Surgical History:  Procedure Laterality Date  . ABDOMINAL HYSTERECTOMY    .  CEREBRAL ANEURYSM REPAIR     Family History  Problem Relation Age of Onset  . Cerebral aneurysm Father   . Alzheimer's disease Mother   . Cancer Maternal Aunt        breast  . Diabetes Maternal Grandmother   . Stroke Maternal Grandfather   . Alzheimer's disease Paternal Grandmother   . Stroke Paternal Grandfather    Social History   Socioeconomic History  . Marital status: Widowed    Spouse name: Not on file  . Number of children: Not on file  . Years of education: Not on file  . Highest education level: Not on file  Occupational History  . Occupation: retired   Tobacco Use  . Smoking status: Never Smoker  . Smokeless tobacco: Never Used  Substance and Sexual Activity  . Alcohol use: No  . Drug use: No  . Sexual activity: Never    Comment: lives with self, no dietary restrictions. works as a Theme park manager, retired from state employment  Other Topics Concern  . Not on file  Social History Narrative  . Not on file   Social Determinants of Health   Financial Resource Strain: Low Risk   . Difficulty of Paying Living Expenses: Not hard at all  Food Insecurity: No Food Insecurity  . Worried About Charity fundraiser in the Last Year: Never true  . Ran Out of Food in the Last Year: Never true  Transportation Needs: No Transportation Needs  . Lack of Transportation (Medical): No  . Lack of Transportation (Non-Medical): No  Physical Activity: Inactive  . Days of Exercise per Week: 0 days  . Minutes of Exercise per Session: 0 min  Stress: No Stress Concern Present  . Feeling of Stress : Not at all  Social Connections: Moderately Isolated  . Frequency of Communication with Friends and Family: More than three times a week  . Frequency of Social Gatherings with Friends and Family: More than three times a week  . Attends Religious Services: More than 4 times per year  . Active Member of Clubs or Organizations: No  . Attends Archivist Meetings: Never  . Marital Status: Widowed    Tobacco Counseling Counseling given: Not Answered   Clinical Intake:  Pre-visit preparation completed: Yes  Pain : No/denies pain     Nutritional Status: BMI > 30  Obese Nutritional Risks: None Diabetes: Yes CBG done?: No Did pt. bring in CBG monitor from home?: No  How often do you need to have someone help you when you read instructions, pamphlets, or other written materials from your doctor or pharmacy?: 1 - Never Diabetes:  Is the patient diabetic?  Yes  If diabetic, was a CBG obtained today?  No  Did the patient bring in their glucometer from  home?  No  How often do you monitor your CBG's? occasionally.   Financial Strains and Diabetes Management:  Are you having any financial strains with the device, your supplies or your medication? No .  Does the patient want to be seen by Chronic Care Management for management of their diabetes?  No  Would the patient like to be referred to a Nutritionist or for Diabetic Management?  No   Diabetic Exams:  Diabetic Eye Exam:  Overdue for diabetic eye exam. Pt has been advised about the importance in completing this exam. Patient has an upcoming appt  Diabetic Foot Exam:  Pt has been advised about the importance in completing this exam. To be completed  Information entered by :: Caroleen Hamman LPN   Activities of Daily Living In your present state of health, do you have any difficulty performing the following activities: 11/19/2020  Hearing? N  Vision? N  Difficulty concentrating or making decisions? N  Walking or climbing stairs? N  Dressing or bathing? N  Doing errands, shopping? N  Preparing Food and eating ? N  Using the Toilet? N  In the past six months, have you accidently leaked urine? N  Do you have problems with loss of bowel control? N  Managing your Medications? N  Managing your Finances? N  Housekeeping or managing your Housekeeping? N  Some recent data might be hidden    Patient Care Team: Mosie Lukes, MD as PCP - General (Family Medicine) Berniece Salines, DO as PCP - Cardiology (Cardiology) Rolley Sims, OD as Consulting Physician (Optometry)  Indicate any recent Medical Services you may have received from other than [facility] providers in the past year (date may be approximate).     Assessment:   This is a routine wellness examination for Ellah.  Hearing/Vision screen  Hearing Screening   _0  _1  _2  _3  _4  _5  _6  _7  _8   Right ear:           Left ear:           Comments: No issues  Vision Screening Comments: Wears  glasses Last eye exam-2021-Dr. Atkins  Dietary issues and exercise activities discussed: Current Exercise Habits: The patient does not participate in regular exercise at present, Exercise limited by: None identified  Goals Addressed            This Visit's Progress   . Eat a healther diet   Not on track   . Increase physical activity   Not on track    5 min rule 2x/week      Depression Screen PHQ 2/9 Scores 11/19/2020 01/03/2020 11/08/2019 10/26/2018 02/02/2018 10/22/2017 09/03/2017  PHQ - 2 Score 0 0 0 0 0 0 1  PHQ- 9 Score - - - - - - 6    Fall Risk Fall Risk  11/19/2020 01/03/2020 11/08/2019 10/26/2018 02/02/2018  Falls in the past year? 0 0 0 0 No  Number falls in past yr: 0 - 0 - -  Comment - - - - -  Injury with Fall? 0 - 0 - -  Follow up Falls prevention discussed - Education provided;Falls prevention discussed - -    FALL RISK PREVENTION PERTAINING TO THE HOME:  Any stairs in or around the home? Yes  If so, are there any without handrails? Yes  Home free of loose throw rugs in walkways, pet beds, electrical cords, etc? Yes  Adequate lighting in your home to reduce risk of falls? Yes   ASSISTIVE DEVICES UTILIZED TO PREVENT FALLS:  Life alert? No  Use of a cane, walker or w/c? No  Grab bars in the bathroom? No  Shower chair or bench in shower? No  Elevated toilet seat or a handicapped toilet? No   TIMED UP AND GO:  Was the test performed? Yes .  Length of time to ambulate 10 feet: 10 sec.   Gait steady and fast without use of assistive device  Cognitive Function:Normal cognitive status assessed by direct observation by this Nurse Health Advisor. No abnormalities found.   MMSE - Mini Mental State Exam 04/17/2016  Orientation to time 5  Orientation to Place 5  Registration 3  Attention/ Calculation 5  Recall 3  Language-  name 2 objects 2  Language- repeat 1  Language- follow 3 step command 3  Language- read & follow direction 1  Write a sentence 1  Copy design 1   Total score 30        Immunizations Immunization History  Administered Date(s) Administered  . Influenza, High Dose Seasonal PF 04/17/2016, 06/04/2017, 07/09/2018, 03/06/2020  . Influenza,inj,Quad PF,6+ Mos 04/11/2014, 05/23/2015  . PFIZER(Purple Top)SARS-COV-2 Vaccination 08/09/2019, 08/30/2019, 05/16/2020    TDAP status: Due, Education has been provided regarding the importance of this vaccine. Advised may receive this vaccine at local pharmacy or Health Dept. Aware to provide a copy of the vaccination record if obtained from local pharmacy or Health Dept. Verbalized acceptance and understanding.  Flu Vaccine status: Up to date  Pneumococcal vaccine status: Due, Education has been provided regarding the importance of this vaccine. Advised may receive this vaccine at local pharmacy or Health Dept. Aware to provide a copy of the vaccination record if obtained from local pharmacy or Health Dept. Verbalized acceptance and understanding.  Covid-19 vaccine status: Completed vaccines  Qualifies for Shingles Vaccine? Yes   Zostavax completed No   Shingrix Completed?: No.    Education has been provided regarding the importance of this vaccine. Patient has been advised to call insurance company to determine out of pocket expense if they have not yet received this vaccine. Advised may also receive vaccine at local pharmacy or Health Dept. Verbalized acceptance and understanding.  Screening Tests Health Maintenance  Topic Date Due  . Pneumococcal Vaccine 88-87 Years old (1 of 2 - PPSV23) Never done  . FOOT EXAM  Never done  . Zoster Vaccines- Shingrix (1 of 2) Never done  . PNA vac Low Risk Adult (1 of 2 - PCV13) Never done  . OPHTHALMOLOGY EXAM  08/23/2016  . COLONOSCOPY (Pts 45-77yr Insurance coverage will need to be confirmed)  06/15/2020  . TETANUS/TDAP  06/15/2020  . MAMMOGRAM  01/11/2021  . INFLUENZA VACCINE  01/14/2021  . HEMOGLOBIN A1C  02/20/2021  . DEXA SCAN  Completed  .  COVID-19 Vaccine  Completed  . Hepatitis C Screening  Completed  . HPV VACCINES  Aged Out    Health Maintenance  Health Maintenance Due  Topic Date Due  . Pneumococcal Vaccine 060653Years old (1 of 2 - PPSV23) Never done  . FOOT EXAM  Never done  . Zoster Vaccines- Shingrix (1 of 2) Never done  . PNA vac Low Risk Adult (1 of 2 - PCV13) Never done  . OPHTHALMOLOGY EXAM  08/23/2016  . COLONOSCOPY (Pts 45-441yrInsurance coverage will need to be confirmed)  06/15/2020  . TETANUS/TDAP  06/15/2020    Colorectal cancer screening: Referral to GI placed today. Pt aware the office will call re: appt.  Mammogram status: Completed Bilateral 01/12/2020. Repeat every year  Bone Density status: Completed 01/12/2020. Results reflect: Bone density results: OSTEOPENIA. Repeat every 2 years.  Lung Cancer Screening: (Low Dose CT Chest recommended if Age 73-80ears, 30 pack-year currently smoking OR have quit w/in 15years.) does not qualify.     Additional Screening:  Hepatitis C Screening:Completed 02/02/2018  Vision Screening: Recommended annual ophthalmology exams for early detection of glaucoma and other disorders of the eye. Is the patient up to date with their annual eye exam?  No  Who is the provider or what is the name of the office in which the patient attends annual eye exams? Dr. AtOrvan Seenatient has an upcoming appt  Dental Screening: Recommended annual  dental exams for proper oral hygiene  Community Resource Referral / Chronic Care Management: CRR required this visit?  No   CCM required this visit?  No      Plan:     I have personally reviewed and noted the following in the patient's chart:   . Medical and social history . Use of alcohol, tobacco or illicit drugs  . Current medications and supplements including opioid prescriptions.  . Functional ability and status . Nutritional status . Physical activity . Advanced directives . List of other  physicians . Hospitalizations, surgeries, and ER visits in previous 12 months . Vitals . Screenings to include cognitive, depression, and falls . Referrals and appointments  In addition, I have reviewed and discussed with patient certain preventive protocols, quality metrics, and best practice recommendations. A written personalized care plan for preventive services as well as general preventive health recommendations were provided to patient.   Patient would like to access avs on mychart.  Marta Antu, LPN   07/23/621  Nurse Health Advisor  Nurse Notes: None

## 2020-11-19 NOTE — Patient Instructions (Signed)
Medication Instructions:  Your physician recommends that you continue on your current medications as directed. Please refer to the Current Medication list given to you today. Lasix refilled   *If you need a refill on your cardiac medications before your next appointment, please call your pharmacy*   Lab Work: Your physician recommends that you return for lab work: TODAY: BMET, [place] If you have labs (blood work) drawn today and your tests are completely normal, you will receive your results only by: Marland Kitchen MyChart Message (if you have MyChart) OR . A paper copy in the mail If you have any lab test that is abnormal or we need to change your treatment, we will call you to review the results.   Testing/Procedures: None   Follow-Up: At [facility], you and your health needs are our priority.  As part of our continuing mission to provide you with exceptional heart care, we have created designated Provider Care Teams.  These Care Teams include your primary Cardiologist (physician) and Advanced Practice Providers (APPs -  Physician Assistants and Nurse Practitioners) who all work together to provide you with the care you need, when you need it.  We recommend signing up for the patient portal called "MyChart".  Sign up information is provided on this After Visit Summary.  MyChart is used to connect with patients for Virtual Visits (Telemedicine).  Patients are able to view lab/test results, encounter notes, upcoming appointments, etc.  Non-urgent messages can be sent to your provider as well.   To learn more about what you can do with MyChart, go to [email or web address].    Your next appointment:   6 months  The format for your next appointment:   In Person  Provider:   Berniece Salines, DO   Other Instructions

## 2020-11-19 NOTE — Progress Notes (Signed)
Cardiology Office Note:    Date:  11/19/2020   ID:  Morgan Medina, DOB 03-21-1946, MRN [id number]  PCP:  Mosie Lukes, MD  Cardiologist:  Berniece Salines, DO  Electrophysiologist:  None   Referring MD: Mosie Lukes, MD   Still experiencing leg swelling  History of Present Illness:    Morgan Medina is a 75 y.o. female with a hx of coronary artery disease with mild stenosis in the LAD and circumflex arteries, diabetes mellitus, hypertension, hyperlipidemia.  Patient is here today for follow-up visit.  Initially saw the patient September 05, 2020 at that time she reported she had been experiencing some shortness of breath on exertion.  Given her risk factors I recommend she undergo a coronary CTA.  She already had an echocardiogram pending.  Interim the patient was able to get her echocardiogram as well as a coronary CTA.  Today she tells me that she has had some relief from her Lasix she is now taking that as prescribed but her prescription is running out.  She denies any shortness of breath, but also she is got some leg swelling.   Past Medical History:  Diagnosis Date  . Anemia   . Cerebral aneurysm    h/o in 2006  . Dacrocystitis 07/19/2015   right  . Diabetes mellitus type 2 in obese ([place]) 11/05/2014  . GERD (gastroesophageal reflux disease) 04/17/2016  . Hyperlipidemia   . Hypertension   . Osteoarthritis of left hip 12/02/2015  . Osteopenia 11/22/2015  . Overweight     Past Surgical History:  Procedure Laterality Date  . ABDOMINAL HYSTERECTOMY    . CEREBRAL ANEURYSM REPAIR      Current Medications: Current Meds  Medication Sig  . amLODipine (NORVASC) 5 MG tablet TAKE 1 TABLET BY MOUTH EVERY DAY  . aspirin 81 MG tablet Takes every other day  . Blood Glucose Monitoring Suppl ([place]) W/DEVICE KIT Use to check blood sugar. DX. E11.9  . carvedilol (COREG) 25 MG tablet TAKE 1 TABLET (25 MG TOTAL) BY MOUTH 2 (TWO) TIMES DAILY WITH A MEAL.  Marland Kitchen Cyanocobalamin (VITAMIN  B 12) 250 MCG LOZG Take by mouth.  . ferrous sulfate 325 (65 FE) MG tablet TAKE 1 TABLET BY MOUTH EVERY DAY WITH BREAKFAST  . losartan (COZAAR) 100 MG tablet Take 1 tablet (100 mg total) by mouth every evening.  . metFORMIN (GLUCOPHAGE-XR) 500 MG 24 hr tablet Take 1 tablet (500 mg total) by mouth daily.  . Multiple Vitamin (MULTIVITAMIN) capsule Take 1 capsule by mouth daily.  . ONE TOUCH LANCETS MISC Use as directed once daily to check blood sugar. DX: E11.9  . potassium chloride SA (KLOR-CON M20) 20 MEQ tablet 20 meq twice daily every other day. On the odd days 20 meq once daily.  . RESTASIS 0.05 % ophthalmic emulsion INSTILL 1 DROP INTO BOTH EYES TWICE A DAY  . rosuvastatin (CRESTOR) 5 MG tablet Take 1 tablet (5 mg total) by mouth every other day.  . [DISCONTINUED] furosemide (LASIX) 40 MG tablet 40 mg twice daily every other day. On the odd days 40 mg once daily. (Patient taking differently: Take 40 mg by mouth 2 (two) times daily. 40 mg twice daily every other day. On the odd days 40 mg once daily.)  . [DISCONTINUED] glucose blood (ONE TOUCH ULTRA TEST) test strip Use as idirected once daily.  DX E11.9 (Patient taking differently: Use as idirected once daily.  DX E11.9)     Allergies:  Patient has no known allergies.   Social History   Socioeconomic History  . Marital status: Widowed    Spouse name: Not on file  . Number of children: Not on file  . Years of education: Not on file  . Highest education level: Not on file  Occupational History  . Not on file  Tobacco Use  . Smoking status: Never Smoker  . Smokeless tobacco: Never Used  Substance and Sexual Activity  . Alcohol use: No  . Drug use: No  . Sexual activity: Never    Comment: lives with self, no dietary restrictions. works as a Theme park manager, retired from state employment  Other Topics Concern  . Not on file  Social History Narrative  . Not on file   Social Determinants of Health   Financial Resource Strain: Not on file   Food Insecurity: Not on file  Transportation Needs: Not on file  Physical Activity: Not on file  Stress: Not on file  Social Connections: Not on file     Family History: The patient's family history includes Alzheimer's disease in her mother and paternal grandmother; Cancer in her maternal aunt; Cerebral aneurysm in her father; Diabetes in her maternal grandmother; Stroke in her maternal grandfather and paternal grandfather.  ROS:   Review of Systems  Constitution: Negative for decreased appetite, fever and weight gain.  HENT: Negative for congestion, ear discharge, hoarse voice and sore throat.   Eyes: Negative for discharge, redness, vision loss in right eye and visual halos.  Cardiovascular: Negative for chest pain, dyspnea on exertion, leg swelling, orthopnea and palpitations.  Respiratory: Negative for cough, hemoptysis, shortness of breath and snoring.   Endocrine: Negative for heat intolerance and polyphagia.  Hematologic/Lymphatic: Negative for bleeding problem. Does not bruise/bleed easily.  Skin: Negative for flushing, nail changes, rash and suspicious lesions.  Musculoskeletal: Negative for arthritis, joint pain, muscle cramps, myalgias, neck pain and stiffness.  Gastrointestinal: Negative for abdominal pain, bowel incontinence, diarrhea and excessive appetite.  Genitourinary: Negative for decreased libido, genital sores and incomplete emptying.  Neurological: Negative for brief paralysis, focal weakness, headaches and loss of balance.  Psychiatric/Behavioral: Negative for altered mental status, depression and suicidal ideas.  Allergic/Immunologic: Negative for HIV exposure and persistent infections.    EKGs/Labs/Other Studies Reviewed:    The following studies were reviewed today:   EKG: None today   Coronary CTA done in September 17, 2020.  FINDINGS: Non-cardiac: See separate report from [facility].  Pulmonary veins drained normally to the left atrium.  No LA appendage thrombus.  Calcium Score: 208 Agatston units.  Coronary Arteries: Right dominant with no anomalies  LM: Mixed plaque distal left main, no significant stenosis.  LAD system: Mixed plaque ostial and proximal LAD, mild (<50%) stenosis. There was a small area of myocardial bridging in the mid LAD.  Circumflex system: Moderate ramus, calcified plaque mid ramus with mild (<50%) stenosis. No plaque or stenosis in the AV LCx.  RCA system: No plaque or stenosis.  IMPRESSION: 1. Coronary artery calcium score 208 Agatston units. This places the patient in the 76th percentile for age and gender, suggesting intermediate to high risk for future cardiac events.  2.  Nonobstructive CAD.  Dalton Mclean   Electronically Signed   By: Loralie Champagne M.D.   On: 09/17/2020 16:46   Addended by Larey Dresser, MD on 09/17/2020 4:48 PM    Study Result  Narrative & Impression  EXAM: OVER-READ INTERPRETATION  CT CHEST  The following report is  an over-read performed by radiologist Dr. Aletta Edouard of [facility], [place] on 09/17/2020. This over-read does not include interpretation of cardiac or coronary anatomy or pathology. The coronary CTA interpretation by the cardiologist is attached.  COMPARISON:  None.  FINDINGS: Vascular: Central pulmonary artery dilatation with the main pulmonary artery measuring up to 3.3 cm.  Mediastinum/Nodes: Visualized mediastinum and hilar regions demonstrate no lymphadenopathy or masses.  Lungs/Pleura: Visualized lungs show no evidence of pulmonary edema, consolidation, pneumothorax, nodule or pleural fluid.  Upper Abdomen: No acute abnormality.  Musculoskeletal: No chest wall mass or suspicious bone lesions identified.  IMPRESSION: Central pulmonary artery dilatation with the main pulmonary artery measuring up to 3.3 cm. This may implicate some degree of underlying pulmonary hypertension.    Transthoracic echocardiogram February 2022 IMPRESSIONS  1. Sigmoid septum noted. Left ventricular ejection fraction, by estimation, is 60 to 65%. The left ventricle has normal function. The left ventricle has no regional wall motion abnormalities. There is mild left ventricular hypertrophy. Left ventricular  diastolic parameters are consistent with Grade I diastolic dysfunction (impaired relaxation).  2. Right ventricular systolic function is normal. The right ventricular size is normal.  3. The mitral valve is normal in structure. No evidence of mitral valve regurgitation. No evidence of mitral stenosis.  4. The aortic valve is normal in structure. Aortic valve regurgitation is not visualized. No aortic stenosis is present.  5. The inferior vena cava is normal in size with greater than 50% respiratory variability, suggesting right atrial pressure of 3 mmHg.   FINDINGS  Left Ventricle: Sigmoid septum noted. Left ventricular ejection fraction, by estimation, is 60 to 65%. The left ventricle has normal function. The left ventricle has no regional wall motion abnormalities. The left  ventricular internal cavity size was  normal in size. There is mild left ventricular hypertrophy. Left ventricular diastolic parameters are consistent with Grade I diastolic  dysfunction (impaired relaxation).   Right Ventricle: The right ventricular size is normal. No increase in right ventricular wall thickness. Right ventricular systolic function is  normal.   Left Atrium: Left atrial size was normal in size.   Right Atrium: Right atrial size was normal in size.   Pericardium: There is no evidence of pericardial effusion.   Mitral Valve: The mitral valve is normal in structure. No evidence of mitral valve regurgitation. No evidence of mitral valve stenosis.   Tricuspid Valve: The tricuspid valve is normal in structure. Tricuspid valve regurgitation is not demonstrated. No evidence of tricuspid   stenosis.   Aortic Valve: The aortic valve is normal in structure. Aortic valve regurgitation is not visualized. No aortic stenosis is present.   Pulmonic Valve: The pulmonic valve was normal in structure. Pulmonic valve regurgitation is not visualized. No evidence of pulmonic stenosis.   Aorta: The aortic root is normal in size and structure.   Venous: The inferior vena cava is normal in size with greater than 50% respiratory variability, suggesting right atrial pressure of 3 mmHg.   IAS/Shunts: No atrial level shunt detected by color flow Doppler.   Recent Labs: 08/20/2020: ALT 9; BUN 14; Creatinine, Ser 0.68; Hemoglobin 12.0; Platelets 272.0; Potassium 4.2; Sodium 142; TSH 2.05  Recent Lipid Panel    Component Value Date/Time   CHOL 264 (H) 08/20/2020 1627   TRIG 146.0 08/20/2020 1627   HDL 51.10 08/20/2020 1627   CHOLHDL 5 08/20/2020 1627   VLDL 29.2 08/20/2020 1627   LDLCALC 183 (H) 08/20/2020 1627   LDLDIRECT 181.0 01/03/2020 1156  Physical Exam:    VS:  BP 114/70   Pulse 64   Ht '5\' 8"'  (1.727 m)   Wt 196 lb (88.9 kg)   SpO2 98%   BMI 29.80 kg/m     Wt Readings from Last 3 Encounters:  11/19/20 196 lb (88.9 kg)  09/05/20 197 lb (89.4 kg)  08/20/20 193 lb 9.6 oz (87.8 kg)     GEN: Well nourished, well developed in no acute distress HEENT: Normal NECK: No JVD; No carotid bruits LYMPHATICS: No lymphadenopathy CARDIAC: S1S2 noted,RRR, no murmurs, rubs, gallops RESPIRATORY:  Clear to auscultation without rales, wheezing or rhonchi  ABDOMEN: Soft, non-tender, non-distended, +bowel sounds, no guarding. EXTREMITIES: No edema, No cyanosis, no clubbing MUSCULOSKELETAL:  No deformity  SKIN: Warm and dry NEUROLOGIC:  Alert and oriented x 3, non-focal PSYCHIATRIC:  Normal affect, good insight  ASSESSMENT:    1. Coronary artery disease involving native coronary artery of native heart without angina pectoris   2. Hypertension, unspecified type   3. Type 2 diabetes  mellitus with hyperosmolarity without coma, without long-term current use of insulin ([place])   4. Bilateral leg edema   5. Grade I diastolic dysfunction    PLAN:     We talked about coronary CTA results-since the patient was admitted to have mild coronary artery disease.  She is on aspirin and statin we will continue this for now.  We will plan to repeat lipid profile at her next visit.  Her goal LDL should be less than 70 given her coronary artery disease.  She understands this.  This is being managed by his primary care doctor.  No adjustments for antidiabetic medications were made today.  She still does have some bilateral leg edema, shortness of breath has improved we will continue the Lasix.  Blood work will be done today to have BMP and mag.  Hyperlipidemia - continue with current statin medication.  The patient is in agreement with the above plan. The patient left the office in stable condition.  The patient will follow up in 6 months or sooner if needed.   Medication Adjustments/Labs and Tests Ordered: Current medicines are reviewed at length with the patient today.  Concerns regarding medicines are outlined above.  Orders Placed This Encounter  Procedures  . Basic metabolic panel  . Magnesium   Meds ordered this encounter  Medications  . furosemide (LASIX) 40 MG tablet    Sig: Take 1 tablet (40 mg total) by mouth 2 (two) times daily. 40 mg twice daily every other day. On the odd days 40 mg once daily.    Dispense:  200 tablet    Refill:  3    Patient Instructions  Medication Instructions:  Your physician recommends that you continue on your current medications as directed. Please refer to the Current Medication list given to you today. Lasix refilled   *If you need a refill on your cardiac medications before your next appointment, please call your pharmacy*   Lab Work: Your physician recommends that you return for lab work: TODAY: BMET, Ashley If you have labs (blood  work) drawn today and your tests are completely normal, you will receive your results only by: Marland Kitchen MyChart Message (if you have MyChart) OR . A paper copy in the mail If you have any lab test that is abnormal or we need to change your treatment, we will call you to review the results.   Testing/Procedures: None   Follow-Up: At [facility], you and your  health needs are our priority.  As part of our continuing mission to provide you with exceptional heart care, we have created designated Provider Care Teams.  These Care Teams include your primary Cardiologist (physician) and Advanced Practice Providers (APPs -  Physician Assistants and Nurse Practitioners) who all work together to provide you with the care you need, when you need it.  We recommend signing up for the patient portal called "MyChart".  Sign up information is provided on this After Visit Summary.  MyChart is used to connect with patients for Virtual Visits (Telemedicine).  Patients are able to view lab/test results, encounter notes, upcoming appointments, etc.  Non-urgent messages can be sent to your provider as well.   To learn more about what you can do with MyChart, go to [email or web address].    Your next appointment:   6 months  The format for your next appointment:   In Person  Provider:   Berniece Salines, DO   Other Instructions      Adopting a Healthy Lifestyle.  Know what a healthy weight is for you (roughly BMI <25) and aim to maintain this   Aim for 7+ servings of fruits and vegetables daily   65-80+ fluid ounces of water or unsweet tea for healthy kidneys   Limit to max 1 drink of alcohol per day; avoid smoking/tobacco   Limit animal fats in diet for cholesterol and heart health - choose grass fed whenever available   Avoid highly processed foods, and foods high in saturated/trans fats   Aim for low stress - take time to unwind and care for your mental health   Aim for 150 min of moderate  intensity exercise weekly for heart health, and weights twice weekly for bone health   Aim for 7-9 hours of sleep daily   When it comes to diets, agreement about the perfect plan isnt easy to find, even among the experts. Experts at the [employer] developed an idea known as the Healthy Eating Plate. Just imagine a plate divided into logical, healthy portions.   The emphasis is on diet quality:   Load up on vegetables and fruits - one-half of your plate: Aim for color and variety, and remember that potatoes dont count.   Go for whole grains - one-quarter of your plate: Whole wheat, barley, wheat berries, quinoa, oats, brown rice, and foods made with them. If you want pasta, go with whole wheat pasta.   Protein power - one-quarter of your plate: Fish, chicken, beans, and nuts are all healthy, versatile protein sources. Limit red meat.   The diet, however, does go beyond the plate, offering a few other suggestions.   Use healthy plant oils, such as olive, canola, soy, corn, sunflower and peanut. Check the labels, and avoid partially hydrogenated oil, which have unhealthy trans fats.   If youre thirsty, drink water. Coffee and tea are good in moderation, but skip sugary drinks and limit milk and dairy products to one or two daily servings.   The type of carbohydrate in the diet is more important than the amount. Some sources of carbohydrates, such as vegetables, fruits, whole grains, and beans-are healthier than others.   Finally, stay active  Signed, Berniece Salines, DO  11/19/2020 12:13 PM    [place]

## 2020-11-19 NOTE — Patient Instructions (Signed)
Morgan Medina , Thank you for taking time to come for your Medicare Wellness Visit. I appreciate your ongoing commitment to your health goals. Please review the following plan we discussed and let me know if I can assist you in the future.   Screening recommendations/referrals: Colonoscopy: Referral ordered today. Someone will be calling you to schedule. Mammogram: Completed 01/12/2020-Due 01/11/2021 Bone Density: Completed 01/12/2020-Due 01/11/2022 Recommended yearly ophthalmology/optometry visit for glaucoma screening and checkup Recommended yearly dental visit for hygiene and checkup  Vaccinations: Influenza vaccine: Up to date Pneumococcal vaccine: Declined Tdap vaccine: Discuss with pharmacy Shingles vaccine: Discuss with pharmacy   Covid-19:Up to date  Advanced directives: Information given today  Conditions/risks identified: See problem list  Next appointment: Follow up in one year for your annual wellness visit 11/21/2021 @ 3:00   Preventive Care 65 Years and Older, Female Preventive care refers to lifestyle choices and visits with your health care provider that can promote health and wellness. What does preventive care include?  A yearly physical exam. This is also called an annual well check.  Dental exams once or twice a year.  Routine eye exams. Ask your health care provider how often you should have your eyes checked.  Personal lifestyle choices, including:  Daily care of your teeth and gums.  Regular physical activity.  Eating a healthy diet.  Avoiding tobacco and drug use.  Limiting alcohol use.  Practicing safe sex.  Taking low-dose aspirin every day.  Taking vitamin and mineral supplements as recommended by your health care provider. What happens during an annual well check? The services and screenings done by your health care provider during your annual well check will depend on your age, overall health, lifestyle risk factors, and family history of  disease. Counseling  Your health care provider may ask you questions about your:  Alcohol use.  Tobacco use.  Drug use.  Emotional well-being.  Home and relationship well-being.  Sexual activity.  Eating habits.  History of falls.  Memory and ability to understand (cognition).  Work and work Statistician.  Reproductive health. Screening  You may have the following tests or measurements:  Height, weight, and BMI.  Blood pressure.  Lipid and cholesterol levels. These may be checked every 5 years, or more frequently if you are over 66 years old.  Skin check.  Lung cancer screening. You may have this screening every year starting at age 73 if you have a 30-pack-year history of smoking and currently smoke or have quit within the past 15 years.  Fecal occult blood test (FOBT) of the stool. You may have this test every year starting at age 73  Flexible sigmoidoscopy or colonoscopy. You may have a sigmoidoscopy every 5 years or a colonoscopy every 10 years starting at age 73.  Hepatitis C blood test.  Hepatitis B blood test.  Sexually transmitted disease (STD) testing.  Diabetes screening. This is done by checking your blood sugar (glucose) after you have not eaten for a while (fasting). You may have this done every 1-3 years.  Bone density scan. This is done to screen for osteoporosis. You may have this done starting at age 73.  Mammogram. This may be done every 1-2 years. Talk to your health care provider about how often you should have regular mammograms. Talk with your health care provider about your test results, treatment options, and if necessary, the need for more tests. Vaccines  Your health care provider may recommend certain vaccines, such as:  Influenza vaccine. This is  recommended every year.  Tetanus, diphtheria, and acellular pertussis (Tdap, Td) vaccine. You may need a Td booster every 10 years.  Zoster vaccine. You may need this after age  73.  Pneumococcal 13-valent conjugate (PCV13) vaccine. One dose is recommended after age 73.  Pneumococcal polysaccharide (PPSV23) vaccine. One dose is recommended after age 73. Talk to your health care provider about which screenings and vaccines you need and how often you need them. This information is not intended to replace advice given to you by your health care provider. Make sure you discuss any questions you have with your health care provider. Document Released: 06/29/2015 Document Revised: 02/20/2016 Document Reviewed: 04/03/2015 Elsevier Interactive Patient Education  2017 [place] Prevention in the Home Falls can cause injuries. They can happen to people of all ages. There are many things you can do to make your home safe and to help prevent falls. What can I do on the outside of my home?  Regularly fix the edges of walkways and driveways and fix any cracks.  Remove anything that might make you trip as you walk through a door, such as a raised step or threshold.  Trim any bushes or trees on the path to your home.  Use bright outdoor lighting.  Clear any walking paths of anything that might make someone trip, such as rocks or tools.  Regularly check to see if handrails are loose or broken. Make sure that both sides of any steps have handrails.  Any raised decks and porches should have guardrails on the edges.  Have any leaves, snow, or ice cleared regularly.  Use sand or salt on walking paths during winter.  Clean up any spills in your garage right away. This includes oil or grease spills. What can I do in the bathroom?  Use night lights.  Install grab bars by the toilet and in the tub and shower. Do not use towel bars as grab bars.  Use non-skid mats or decals in the tub or shower.  If you need to sit down in the shower, use a plastic, non-slip stool.  Keep the floor dry. Clean up any water that spills on the floor as soon as it happens.  Remove  soap buildup in the tub or shower regularly.  Attach bath mats securely with double-sided non-slip rug tape.  Do not have throw rugs and other things on the floor that can make you trip. What can I do in the bedroom?  Use night lights.  Make sure that you have a light by your bed that is easy to reach.  Do not use any sheets or blankets that are too big for your bed. They should not hang down onto the floor.  Have a firm chair that has side arms. You can use this for support while you get dressed.  Do not have throw rugs and other things on the floor that can make you trip. What can I do in the kitchen?  Clean up any spills right away.  Avoid walking on wet floors.  Keep items that you use a lot in easy-to-reach places.  If you need to reach something above you, use a strong step stool that has a grab bar.  Keep electrical cords out of the way.  Do not use floor polish or wax that makes floors slippery. If you must use wax, use non-skid floor wax.  Do not have throw rugs and other things on the floor that can make you trip.  What can I do with my stairs?  Do not leave any items on the stairs.  Make sure that there are handrails on both sides of the stairs and use them. Fix handrails that are broken or loose. Make sure that handrails are as long as the stairways.  Check any carpeting to make sure that it is firmly attached to the stairs. Fix any carpet that is loose or worn.  Avoid having throw rugs at the top or bottom of the stairs. If you do have throw rugs, attach them to the floor with carpet tape.  Make sure that you have a light switch at the top of the stairs and the bottom of the stairs. If you do not have them, ask someone to add them for you. What else can I do to help prevent falls?  Wear shoes that:  Do not have high heels.  Have rubber bottoms.  Are comfortable and fit you well.  Are closed at the toe. Do not wear sandals.  If you use a  stepladder:  Make sure that it is fully opened. Do not climb a closed stepladder.  Make sure that both sides of the stepladder are locked into place.  Ask someone to hold it for you, if possible.  Clearly mark and make sure that you can see:  Any grab bars or handrails.  First and last steps.  Where the edge of each step is.  Use tools that help you move around (mobility aids) if they are needed. These include:  Canes.  Walkers.  Scooters.  Crutches.  Turn on the lights when you go into a dark area. Replace any light bulbs as soon as they burn out.  Set up your furniture so you have a clear path. Avoid moving your furniture around.  If any of your floors are uneven, fix them.  If there are any pets around you, be aware of where they are.  Review your medicines with your doctor. Some medicines can make you feel dizzy. This can increase your chance of falling. Ask your doctor what other things that you can do to help prevent falls. This information is not intended to replace advice given to you by your health care provider. Make sure you discuss any questions you have with your health care provider. Document Released: 03/29/2009 Document Revised: 11/08/2015 Document Reviewed: 07/07/2014 Elsevier Interactive Patient Education  2017 [employer].

## 2020-11-20 LAB — BASIC METABOLIC PANEL
BUN/Creatinine Ratio: 17 (ref 12–28)
BUN: 13 mg/dL (ref 8–27)
CO2: 25 mmol/L (ref 20–29)
Calcium: 9.8 mg/dL (ref 8.7–10.3)
Chloride: 105 mmol/L (ref 96–106)
Creatinine, Ser: 0.78 mg/dL (ref 0.57–1.00)
Glucose: 130 mg/dL — ABNORMAL HIGH (ref 65–99)
Potassium: 4.5 mmol/L (ref 3.5–5.2)
Sodium: 146 mmol/L — ABNORMAL HIGH (ref 134–144)
eGFR: 80 mL/min/{1.73_m2} (ref >59)

## 2020-11-20 LAB — MAGNESIUM: Magnesium: 2.2 mg/dL (ref 1.6–2.3)

## 2020-11-22 ENCOUNTER — Telehealth: Payer: Self-pay

## 2020-11-22 NOTE — Telephone Encounter (Signed)
-----   Message from Berniece Salines, DO sent at 11/20/2020  3:53 PM EDT ----- Sodium slightly elevated, will continue to monitor.  Could be slightly dehydrated

## 2020-11-22 NOTE — Telephone Encounter (Signed)
Left message on patients voicemail to please return our call.   Letter mailed to patient at this time.  

## 2020-11-29 ENCOUNTER — Other Ambulatory Visit: Payer: Self-pay

## 2020-11-29 ENCOUNTER — Ambulatory Visit: Payer: Medicare PPO | Admitting: Family Medicine

## 2020-11-29 VITALS — BP 130/84 | HR 66 | Temp 98.3°F | Resp 16 | Wt 197.0 lb

## 2020-11-29 DIAGNOSIS — M8580 Other specified disorders of bone density and structure, unspecified site: Secondary | ICD-10-CM

## 2020-11-29 DIAGNOSIS — E1169 Type 2 diabetes mellitus with other specified complication: Secondary | ICD-10-CM

## 2020-11-29 DIAGNOSIS — K635 Polyp of colon: Secondary | ICD-10-CM

## 2020-11-29 DIAGNOSIS — R194 Change in bowel habit: Secondary | ICD-10-CM

## 2020-11-29 DIAGNOSIS — I10 Essential (primary) hypertension: Secondary | ICD-10-CM | POA: Diagnosis not present

## 2020-11-29 DIAGNOSIS — E782 Mixed hyperlipidemia: Secondary | ICD-10-CM | POA: Diagnosis not present

## 2020-11-29 DIAGNOSIS — R600 Localized edema: Secondary | ICD-10-CM

## 2020-11-29 DIAGNOSIS — E669 Obesity, unspecified: Secondary | ICD-10-CM

## 2020-11-29 NOTE — Progress Notes (Signed)
Patient ID: Morgan Medina, female    DOB: 09/20/1945  Age: 75 y.o. MRN: [id number]    Subjective:  Subjective  HPI Morgan Medina presents for office visit today for follow up on HTN and osteopenia. She reports that she is feeling ok today, but has concerns regarding her LE's edema and trouble with her BM's. She reports that her feet swelling has been consistent and getting progressively worse. She states that she attempted to elevate the feet, but reports that she is not doing it as frequently as she should. She states that the swelling in the morning is better. She denies any chest pain, SOB, fever, abdominal pain, cough, chills, sore throat, dysuria, urinary incontinence, back pain or V.   She reports struggling with leaky BM's, but states that it is not to the point of experiencing diarrhea and only more loose than baseline. She states that it has been happening for a month now and reports that the symptoms come and go. She denies having any GI symptoms, HA, nausea, or body aches. She suspects that it might have been a bug infection. She states that the frequency of her symptoms are random, sometimes will last for a couple of days. She denies any blood or oily sensation in stool. She denies any appetite changes or urinary issues. She states that she has had polyps in the past. She states that she has some earwax buildup that she would like to have flushed.   Review of Systems  Constitutional:  Negative for chills, fatigue and fever.  HENT:  Negative for congestion, rhinorrhea, sinus pressure, sinus pain and sore throat.   Eyes:  Negative for pain.  Respiratory:  Negative for cough and shortness of breath.   Cardiovascular:  Negative for chest pain, palpitations and leg swelling.  Gastrointestinal:  Negative for abdominal pain, blood in stool, nausea and vomiting.  Genitourinary:  Negative for decreased urine volume, difficulty urinating, dysuria, flank pain, frequency, urgency, vaginal bleeding  and vaginal discharge.  Musculoskeletal:  Negative for back pain.  Neurological:  Negative for headaches.   History Past Medical History:  Diagnosis Date   Anemia    Cerebral aneurysm    h/o in 2006   Dacrocystitis 07/19/2015   right   Diabetes mellitus type 2 in obese ([place]) 11/05/2014   GERD (gastroesophageal reflux disease) 04/17/2016   Hyperlipidemia    Hypertension    Osteoarthritis of left hip 12/02/2015   Osteopenia 11/22/2015   Overweight     She has a past surgical history that includes Abdominal hysterectomy and Cerebral aneurysm repair.   Her family history includes Alzheimer's disease in her mother and paternal grandmother; Cancer in her maternal aunt; Cerebral aneurysm in her father; Diabetes in her maternal grandmother; Stroke in her maternal grandfather and paternal grandfather.She reports that she has never smoked. She has never used smokeless tobacco. She reports that she does not drink alcohol and does not use drugs.  Current Outpatient Medications on File Prior to Visit  Medication Sig Dispense Refill   amLODipine (NORVASC) 5 MG tablet TAKE 1 TABLET BY MOUTH EVERY DAY 90 tablet 0   aspirin 81 MG tablet Takes every other day     Blood Glucose Monitoring Suppl ([place]) W/DEVICE KIT Use to check blood sugar. DX. E11.9 1 each 0   carvedilol (COREG) 25 MG tablet TAKE 1 TABLET (25 MG TOTAL) BY MOUTH 2 (TWO) TIMES DAILY WITH A MEAL. 180 tablet 1   Cyanocobalamin (VITAMIN B 12) 250 MCG  LOZG Take by mouth.     ferrous sulfate 325 (65 FE) MG tablet TAKE 1 TABLET BY MOUTH EVERY DAY WITH BREAKFAST 90 tablet 1   furosemide (LASIX) 40 MG tablet Take 1 tablet (40 mg total) by mouth 2 (two) times daily. 40 mg twice daily every other day. On the odd days 40 mg once daily. 200 tablet 3   losartan (COZAAR) 100 MG tablet Take 1 tablet (100 mg total) by mouth every evening. 90 tablet 1   metFORMIN (GLUCOPHAGE-XR) 500 MG 24 hr tablet Take 1 tablet (500 mg total) by mouth  daily. 90 tablet 1   Multiple Vitamin (MULTIVITAMIN) capsule Take 1 capsule by mouth daily.     ONE TOUCH LANCETS MISC Use as directed once daily to check blood sugar. DX: E11.9 100 each 0   potassium chloride SA (KLOR-CON M20) 20 MEQ tablet 20 meq twice daily every other day. On the odd days 20 meq once daily. 90 tablet 3   RESTASIS 0.05 % ophthalmic emulsion INSTILL 1 DROP INTO BOTH EYES TWICE A DAY  3   rosuvastatin (CRESTOR) 5 MG tablet Take 1 tablet (5 mg total) by mouth every other day. 16 tablet 3   No current facility-administered medications on file prior to visit.     Objective:  Objective  Physical Exam Constitutional:      General: She is not in acute distress.    Appearance: Normal appearance. She is not ill-appearing or toxic-appearing.  HENT:     Head: Normocephalic and atraumatic.     Right Ear: Tympanic membrane, ear canal and external ear normal.     Left Ear: Tympanic membrane, ear canal and external ear normal.     Nose: No congestion or rhinorrhea.  Eyes:     Extraocular Movements: Extraocular movements intact.     Right eye: No nystagmus.     Left eye: No nystagmus.     Pupils: Pupils are equal, round, and reactive to light.  Cardiovascular:     Rate and Rhythm: Normal rate and regular rhythm.     Pulses: Normal pulses.     Heart sounds: Normal heart sounds. No murmur heard. Pulmonary:     Effort: Pulmonary effort is normal. No respiratory distress.     Breath sounds: Normal breath sounds. No wheezing, rhonchi or rales.  Abdominal:     General: Bowel sounds are normal.     Palpations: Abdomen is soft. There is no mass.     Tenderness: no abdominal tenderness There is no guarding.     Hernia: No hernia is present.  Musculoskeletal:        General: Normal range of motion.     Cervical back: Normal range of motion and neck supple.  Skin:    General: Skin is warm and dry.  Neurological:     Mental Status: She is alert and oriented to person, place, and  time.  Psychiatric:        Behavior: Behavior normal.   BP 130/84   Pulse 66   Temp 98.3 F (36.8 C)   Resp 16   Wt 197 lb (89.4 kg)   SpO2 93%   BMI 29.95 kg/m  Wt Readings from Last 3 Encounters:  11/29/20 197 lb (89.4 kg)  11/19/20 198 lb 9.6 oz (90.1 kg)  11/19/20 196 lb (88.9 kg)     Lab Results  Component Value Date   WBC 8.0 08/20/2020   HGB 12.0 08/20/2020   HCT 37.0 08/20/2020  PLT 272.0 08/20/2020   GLUCOSE 130 (H) 11/19/2020   CHOL 264 (H) 08/20/2020   TRIG 146.0 08/20/2020   HDL 51.10 08/20/2020   LDLDIRECT 181.0 01/03/2020   LDLCALC 183 (H) 08/20/2020   ALT 9 08/20/2020   AST 12 08/20/2020   NA 146 (H) 11/19/2020   K 4.5 11/19/2020   CL 105 11/19/2020   CREATININE 0.78 11/19/2020   BUN 13 11/19/2020   CO2 25 11/19/2020   TSH 2.05 08/20/2020   HGBA1C 6.6 (H) 08/20/2020    ECHOCARDIOGRAM COMPLETE  Result Date: 09/21/2020    ECHOCARDIOGRAM REPORT   Patient Name:   TIASIA WEBERG Date of Exam: 09/21/2020 Medical Rec #:  [id number]     Height:       68.0 in Accession #:    [id number]    Weight:       197.0 lb Date of Birth:  1945-09-04     BSA:          2.031 m Patient Age:    6 years      BP:           160/64 mmHg Patient Gender: F             HR:           55 bpm. Exam Location:  [facility] Procedure: 2D Echo, 3D Echo, Cardiac Doppler and Color Doppler Indications:    R07.9* Chest pain, unspecified; R60.0 Lower extremity edema  History:        Patient has no prior history of Echocardiogram examinations.                 Risk Factors:Hypertension, Diabetes and Dyslipidemia.  Sonographer:    Geradine Girt Referring Phys: [place]  1. Sigmoid septum noted. Left ventricular ejection fraction, by estimation, is 60 to 65%. The left ventricle has normal function. The left ventricle has no regional wall motion abnormalities. There is mild left ventricular hypertrophy. Left ventricular diastolic parameters are consistent with Grade I diastolic  dysfunction (impaired relaxation).  2. Right ventricular systolic function is normal. The right ventricular size is normal.  3. The mitral valve is normal in structure. No evidence of mitral valve regurgitation. No evidence of mitral stenosis.  4. The aortic valve is normal in structure. Aortic valve regurgitation is not visualized. No aortic stenosis is present.  5. The inferior vena cava is normal in size with greater than 50% respiratory variability, suggesting right atrial pressure of 3 mmHg. FINDINGS  Left Ventricle: Sigmoid septum noted. Left ventricular ejection fraction, by estimation, is 60 to 65%. The left ventricle has normal function. The left ventricle has no regional wall motion abnormalities. The left ventricular internal cavity size was normal in size. There is mild left ventricular hypertrophy. Left ventricular diastolic parameters are consistent with Grade I diastolic dysfunction (impaired relaxation). Right Ventricle: The right ventricular size is normal. No increase in right ventricular wall thickness. Right ventricular systolic function is normal. Left Atrium: Left atrial size was normal in size. Right Atrium: Right atrial size was normal in size. Pericardium: There is no evidence of pericardial effusion. Mitral Valve: The mitral valve is normal in structure. No evidence of mitral valve regurgitation. No evidence of mitral valve stenosis. Tricuspid Valve: The tricuspid valve is normal in structure. Tricuspid valve regurgitation is not demonstrated. No evidence of tricuspid stenosis. Aortic Valve: The aortic valve is normal in structure. Aortic valve regurgitation is not visualized. No aortic stenosis is present. Pulmonic  Valve: The pulmonic valve was normal in structure. Pulmonic valve regurgitation is not visualized. No evidence of pulmonic stenosis. Aorta: The aortic root is normal in size and structure. Venous: The inferior vena cava is normal in size with greater than 50% respiratory  variability, suggesting right atrial pressure of 3 mmHg. IAS/Shunts: No atrial level shunt detected by color flow Doppler.  LEFT VENTRICLE PLAX 2D LVIDd:         4.15 cm  Diastology LVIDs:         2.67 cm  LV e' medial:    4.46 cm/s LV PW:         1.01 cm  LV E/e' medial:  18.5 LV IVS:        1.28 cm  LV e' lateral:   9.25 cm/s LVOT diam:     2.00 cm  LV E/e' lateral: 8.9 LV SV:         74 LV SV Index:   37 LVOT Area:     3.14 cm                          3D Volume EF:                         3D EF:        69 %                         LV EDV:       114 ml                         LV ESV:       35 ml                         LV SV:        79 ml RIGHT VENTRICLE RV S prime:     10.90 cm/s TAPSE (M-mode): 3.3 cm LEFT ATRIUM             Index       RIGHT ATRIUM           Index LA Vol (A2C):   27.1 ml 13.35 ml/m RA Area:     11.00 cm LA Vol (A4C):   37.2 ml 18.32 ml/m RA Volume:   19.70 ml  9.70 ml/m LA Biplane Vol: 34.2 ml 16.84 ml/m  AORTIC VALVE LVOT Vmax:   96.60 cm/s LVOT Vmean:  56.200 cm/s LVOT VTI:    0.237 m  AORTA Ao Root diam: 2.70 cm Ao Asc diam:  2.90 cm MITRAL VALVE MV Area (PHT): 2.96 cm     SHUNTS MV Decel Time: 256 msec     Systemic VTI:  0.24 m MV E velocity: 82.70 cm/s   Systemic Diam: 2.00 cm MV A velocity: 108.00 cm/s MV E/A ratio:  0.77 Jenne Campus MD Electronically signed by Jenne Campus MD Signature Date/Time: 09/21/2020/12:01:54 PM    Final      Assessment & Plan:  Plan    No orders of the defined types were placed in this encounter.   Problem List Items Addressed This Visit     HTN (hypertension)    Well controlled, no changes to meds. Encouraged heart healthy diet such as the DASH diet and exercise as tolerated.        Hyperlipidemia  Tolerating statin, encouraged heart healthy diet, avoid trans fats, minimize simple carbs and saturated fats. Increase exercise as tolerated       Colon polyps    Is referred to [facility] gastroenterology for futher evaluation        Diabetes mellitus type 2 in obese ([facility])    hgba1c acceptable, minimize simple carbs. Increase exercise as tolerated. Continue current meds       Osteopenia    Encouraged to get adequate exercise, calcium and vitamin d intake       Bilateral leg edema    Cardiac evaluation shows no major concerns. Discussed need to try light weight compression hose on in am off in pm, knee highs. Minimize sodium, elevate feet above heart tid and continue furosemide.        Change in bowel habit - Primary    Struggling with changes and looser more frequent stools and when they are the most loose she suffers some rectal leakage. She is referred to gastroenterology for evaluation and asked to start Benefiber twice daily and a probiotic for now. Eat a diet low in processed foods, high fats and simple carbs.       Relevant Orders   Ambulatory referral to Gastroenterology    Follow-up: No follow-ups on file.   I,David Hanna,acting as a scribe for Penni Homans, MD.,have documented all relevant documentation on the behalf of Penni Homans, MD,as directed by  Penni Homans, MD while in the presence of Penni Homans, MD.  I, Mosie Lukes, MD personally performed the services described in this documentation. All medical record entries made by the scribe were at my direction and in my presence. I have reviewed the chart and agree that the record reflects my personal performance and is accurate and complete

## 2020-11-29 NOTE — Patient Instructions (Addendum)
Add Benefiber twice daily, add a probiotic [place] or any multistrain probiotic  Shingrix is the new shingles shot, 2 shots over 2-6 months, confirm coverage with insurance and document, then can return here for shots with nurse appt or at pharmacy   Prevnar 20 is the one time pneumonia shot  Take 4th covid shot any time  Call opthamology for next appointment

## 2020-11-30 DIAGNOSIS — R194 Change in bowel habit: Secondary | ICD-10-CM | POA: Insufficient documentation

## 2020-11-30 NOTE — Assessment & Plan Note (Signed)
Struggling with changes and looser more frequent stools and when they are the most loose she suffers some rectal leakage. She is referred to gastroenterology for evaluation and asked to start Benefiber twice daily and a probiotic for now. Eat a diet low in processed foods, high fats and simple carbs.

## 2020-11-30 NOTE — Assessment & Plan Note (Signed)
hgba1c acceptable, minimize simple carbs. Increase exercise as tolerated. Continue current meds 

## 2020-11-30 NOTE — Assessment & Plan Note (Signed)
Tolerating statin, encouraged heart healthy diet, avoid trans fats, minimize simple carbs and saturated fats. Increase exercise as tolerated 

## 2020-11-30 NOTE — Assessment & Plan Note (Signed)
Is referred to [facility] gastroenterology for futher evaluation

## 2020-11-30 NOTE — Assessment & Plan Note (Signed)
Well controlled, no changes to meds. Encouraged heart healthy diet such as the DASH diet and exercise as tolerated.  °

## 2020-11-30 NOTE — Assessment & Plan Note (Signed)
Encouraged to get adequate exercise, calcium and vitamin d intake 

## 2020-11-30 NOTE — Assessment & Plan Note (Signed)
Cardiac evaluation shows no major concerns. Discussed need to try light weight compression hose on in am off in pm, knee highs. Minimize sodium, elevate feet above heart tid and continue furosemide.

## 2021-01-04 DIAGNOSIS — R059 Cough, unspecified: Secondary | ICD-10-CM | POA: Diagnosis not present

## 2021-01-04 DIAGNOSIS — U071 COVID-19: Secondary | ICD-10-CM | POA: Diagnosis not present

## 2021-01-07 DIAGNOSIS — U071 COVID-19: Secondary | ICD-10-CM | POA: Diagnosis not present

## 2021-01-30 DIAGNOSIS — Z1211 Encounter for screening for malignant neoplasm of colon: Secondary | ICD-10-CM | POA: Diagnosis not present

## 2021-02-08 DIAGNOSIS — E119 Type 2 diabetes mellitus without complications: Secondary | ICD-10-CM | POA: Diagnosis not present

## 2021-02-08 DIAGNOSIS — Z1211 Encounter for screening for malignant neoplasm of colon: Secondary | ICD-10-CM | POA: Diagnosis not present

## 2021-02-08 DIAGNOSIS — Z01818 Encounter for other preprocedural examination: Secondary | ICD-10-CM | POA: Diagnosis not present

## 2021-02-08 LAB — HM COLONOSCOPY

## 2021-02-14 DIAGNOSIS — K635 Polyp of colon: Secondary | ICD-10-CM | POA: Diagnosis not present

## 2021-03-01 ENCOUNTER — Telehealth: Payer: Self-pay | Admitting: Family Medicine

## 2021-03-01 MED ORDER — METFORMIN HCL ER 500 MG PO TB24: 500.0000 mg | ORAL_TABLET | Freq: Every day | ORAL | 1 refills | Status: DC

## 2021-03-01 NOTE — Telephone Encounter (Signed)
Patient notified that rx has been sent to her pharmacy.

## 2021-03-01 NOTE — Telephone Encounter (Signed)
Medication: metFORMIN (GLUCOPHAGE-XR) 500 MG 24 hr tablet  Has the patient contacted their pharmacy? Yes.   (If no, request that the patient contact the pharmacy for the refill.) (If yes, when and what did the pharmacy advise?)  Preferred Pharmacy (with phone number or street name):  [id number] [place] [address] [place] [address] Phone:  [phone] Fax:  [phone] Agent: Please be advised that RX refills may take up to 3 business days. We ask that you follow-up with your pharmacy.

## 2021-03-04 ENCOUNTER — Other Ambulatory Visit: Payer: Self-pay | Admitting: *Deleted

## 2021-03-04 DIAGNOSIS — D508 Other iron deficiency anemias: Secondary | ICD-10-CM

## 2021-03-04 DIAGNOSIS — I10 Essential (primary) hypertension: Secondary | ICD-10-CM

## 2021-03-04 DIAGNOSIS — E1169 Type 2 diabetes mellitus with other specified complication: Secondary | ICD-10-CM

## 2021-03-04 DIAGNOSIS — E559 Vitamin D deficiency, unspecified: Secondary | ICD-10-CM

## 2021-03-04 DIAGNOSIS — R7989 Other specified abnormal findings of blood chemistry: Secondary | ICD-10-CM

## 2021-03-04 DIAGNOSIS — E782 Mixed hyperlipidemia: Secondary | ICD-10-CM

## 2021-03-07 ENCOUNTER — Other Ambulatory Visit: Payer: Medicare PPO

## 2021-03-12 ENCOUNTER — Other Ambulatory Visit (INDEPENDENT_AMBULATORY_CARE_PROVIDER_SITE_OTHER): Payer: Medicare PPO

## 2021-03-12 ENCOUNTER — Other Ambulatory Visit: Payer: Self-pay

## 2021-03-12 DIAGNOSIS — E1169 Type 2 diabetes mellitus with other specified complication: Secondary | ICD-10-CM | POA: Diagnosis not present

## 2021-03-12 DIAGNOSIS — E669 Obesity, unspecified: Secondary | ICD-10-CM | POA: Diagnosis not present

## 2021-03-12 DIAGNOSIS — E782 Mixed hyperlipidemia: Secondary | ICD-10-CM | POA: Diagnosis not present

## 2021-03-12 DIAGNOSIS — D508 Other iron deficiency anemias: Secondary | ICD-10-CM

## 2021-03-12 DIAGNOSIS — I10 Essential (primary) hypertension: Secondary | ICD-10-CM | POA: Diagnosis not present

## 2021-03-12 DIAGNOSIS — R7989 Other specified abnormal findings of blood chemistry: Secondary | ICD-10-CM

## 2021-03-12 DIAGNOSIS — E559 Vitamin D deficiency, unspecified: Secondary | ICD-10-CM | POA: Diagnosis not present

## 2021-03-12 LAB — TSH: TSH: 3.94 u[IU]/mL (ref 0.35–5.50)

## 2021-03-12 LAB — COMPREHENSIVE METABOLIC PANEL
ALT: 10 U/L (ref 0–35)
AST: 10 U/L (ref 0–37)
Albumin: 4.4 g/dL (ref 3.5–5.2)
Alkaline Phosphatase: 61 U/L (ref 39–117)
BUN: 13 mg/dL (ref 6–23)
CO2: 32 mEq/L (ref 19–32)
Calcium: 9.7 mg/dL (ref 8.4–10.5)
Chloride: 101 mEq/L (ref 96–112)
Creatinine, Ser: 0.76 mg/dL (ref 0.40–1.20)
GFR: 77.68 mL/min (ref >60.00)
Glucose, Bld: 132 mg/dL — ABNORMAL HIGH (ref 70–99)
Potassium: 3.9 mEq/L (ref 3.5–5.1)
Sodium: 143 mEq/L (ref 135–145)
Total Bilirubin: 0.4 mg/dL (ref 0.2–1.2)
Total Protein: 7.3 g/dL (ref 6.0–8.3)

## 2021-03-12 LAB — CBC
HCT: 35.7 % — ABNORMAL LOW (ref 36.0–46.0)
Hemoglobin: 11.4 g/dL — ABNORMAL LOW (ref 12.0–15.0)
MCHC: 31.9 g/dL (ref 30.0–36.0)
MCV: 74.5 fl — ABNORMAL LOW (ref 78.0–100.0)
Platelets: 255 10*3/uL (ref 150.0–400.0)
RBC: 4.79 Mil/uL (ref 3.87–5.11)
RDW: 13.8 % (ref 11.5–15.5)
WBC: 7.9 10*3/uL (ref 4.0–10.5)

## 2021-03-12 LAB — HEMOGLOBIN A1C: Hgb A1c MFr Bld: 7.3 % — ABNORMAL HIGH (ref 4.6–6.5)

## 2021-03-12 LAB — VITAMIN B12: Vitamin B-12: 1046 pg/mL — ABNORMAL HIGH (ref 211–911)

## 2021-03-12 LAB — LIPID PANEL
Cholesterol: 170 mg/dL (ref 0–200)
HDL: 50.3 mg/dL (ref >39.00)
LDL Cholesterol: 95 mg/dL (ref 0–99)
NonHDL: 119.65
Total CHOL/HDL Ratio: 3
Triglycerides: 125 mg/dL (ref 0.0–149.0)
VLDL: 25 mg/dL (ref 0.0–40.0)

## 2021-03-12 LAB — VITAMIN D 25 HYDROXY (VIT D DEFICIENCY, FRACTURES): VITD: 31.29 ng/mL (ref 30.00–100.00)

## 2021-03-14 ENCOUNTER — Ambulatory Visit (INDEPENDENT_AMBULATORY_CARE_PROVIDER_SITE_OTHER): Payer: Medicare PPO | Admitting: Family Medicine

## 2021-03-14 ENCOUNTER — Encounter: Payer: Self-pay | Admitting: Family Medicine

## 2021-03-14 ENCOUNTER — Other Ambulatory Visit: Payer: Self-pay

## 2021-03-14 VITALS — BP 122/70 | HR 70 | Temp 98.4°F | Resp 16 | Ht 68.0 in | Wt 201.2 lb

## 2021-03-14 DIAGNOSIS — Z23 Encounter for immunization: Secondary | ICD-10-CM | POA: Diagnosis not present

## 2021-03-14 DIAGNOSIS — E1100 Type 2 diabetes mellitus with hyperosmolarity without nonketotic hyperglycemic-hyperosmolar coma (NKHHC): Secondary | ICD-10-CM

## 2021-03-14 DIAGNOSIS — I10 Essential (primary) hypertension: Secondary | ICD-10-CM

## 2021-03-14 DIAGNOSIS — R7989 Other specified abnormal findings of blood chemistry: Secondary | ICD-10-CM | POA: Diagnosis not present

## 2021-03-14 DIAGNOSIS — E559 Vitamin D deficiency, unspecified: Secondary | ICD-10-CM

## 2021-03-14 DIAGNOSIS — Z0000 Encounter for general adult medical examination without abnormal findings: Secondary | ICD-10-CM | POA: Diagnosis not present

## 2021-03-14 DIAGNOSIS — I2510 Atherosclerotic heart disease of native coronary artery without angina pectoris: Secondary | ICD-10-CM

## 2021-03-14 DIAGNOSIS — M8580 Other specified disorders of bone density and structure, unspecified site: Secondary | ICD-10-CM

## 2021-03-14 DIAGNOSIS — E669 Obesity, unspecified: Secondary | ICD-10-CM

## 2021-03-14 DIAGNOSIS — E1169 Type 2 diabetes mellitus with other specified complication: Secondary | ICD-10-CM | POA: Diagnosis not present

## 2021-03-14 DIAGNOSIS — E782 Mixed hyperlipidemia: Secondary | ICD-10-CM | POA: Diagnosis not present

## 2021-03-14 DIAGNOSIS — H269 Unspecified cataract: Secondary | ICD-10-CM

## 2021-03-14 DIAGNOSIS — D649 Anemia, unspecified: Secondary | ICD-10-CM | POA: Diagnosis not present

## 2021-03-14 NOTE — Patient Instructions (Addendum)
Call Dr Orvan Seen for an eye exam and have him send [place] the note.  Check with pharmacy regarding coverage of Tdap (tetanus) and if you suffer an injury let us know and we can get you a shot  Shingrix is the new shingles shot, 2 shots over 2-6 months, confirm coverage with insurance and document, then can return here for shots with nurse appt or at pharmacy    Paxlovid or Molnupiravir is the new COVID medication we can give you if you get COVID so make sure you test if you have symptoms because we have to treat by day 5 of symptoms for it to be effective. If you are positive let us know so we can treat. If a home test is negative and your symptoms are persistent get a PCR test. Can check testing locations at [email or web address] If you are positive we will make an appointment with [place] and we will send in Paxlovid or Molnupiravir if you would like it. Check with your pharmacy before we meet to confirm they have it in stock, if they do not then we can get the prescription at the [place] 73 Years and Older, Female Preventive care refers to lifestyle choices and visits with your health care provider that can promote health and wellness. This includes: A yearly physical exam. This is also called an annual wellness visit. Regular dental and eye exams. Immunizations. Screening for certain conditions. Healthy lifestyle choices, such as: Eating a healthy diet. Getting regular exercise. Not using drugs or products that contain nicotine and tobacco. Limiting alcohol use. What can I expect for my preventive care visit? Physical exam Your health care provider will check your: Height and weight. These may be used to calculate your BMI (body mass index). BMI is a measurement that tells if you are at a healthy weight. Heart rate and blood pressure. Body temperature. Skin for abnormal spots. Counseling Your health care provider may ask you questions about your: Past medical  problems. Family's medical history. Alcohol, tobacco, and drug use. Emotional well-being. Home life and relationship well-being. Sexual activity. Diet, exercise, and sleep habits. History of falls. Memory and ability to understand (cognition). Work and work Statistician. Pregnancy and menstrual history. Access to firearms. What immunizations do I need? Vaccines are usually given at various ages, according to a schedule. Your health care provider will recommend vaccines for you based on your age, medical history, and lifestyle or other factors, such as travel or where you work. What tests do I need? Blood tests Lipid and cholesterol levels. These may be checked every 5 years, or more often depending on your overall health. Hepatitis C test. Hepatitis B test. Screening Lung cancer screening. You may have this screening every year starting at age 73 if you have a 30-pack-year history of smoking and currently smoke or have quit within the past 15 years. Colorectal cancer screening. All adults should have this screening starting at age 73 and continuing until age 45. Your health care provider may recommend screening at age 8 if you are at increased risk. You will have tests every 1-10 years, depending on your results and the type of screening test. Diabetes screening. This is done by checking your blood sugar (glucose) after you have not eaten for a while (fasting). You may have this done every 1-3 years. Mammogram. This may be done every 1-2 years. Talk with your health care provider about how often you should have regular mammograms. Abdominal aortic  aneurysm (AAA) screening. You may need this if you are a current or former smoker. BRCA-related cancer screening. This may be done if you have a family history of breast, ovarian, tubal, or peritoneal cancers. Other tests STD (sexually transmitted disease) testing, if you are at risk. Bone density scan. This is done to screen for  osteoporosis. You may have this done starting at age 73. Talk with your health care provider about your test results, treatment options, and if necessary, the need for more tests. Follow these instructions at home: Eating and drinking  Eat a diet that includes fresh fruits and vegetables, whole grains, lean protein, and low-fat dairy products. Limit your intake of foods with high amounts of sugar, saturated fats, and salt. Take vitamin and mineral supplements as recommended by your health care provider. Do not drink alcohol if your health care provider tells you not to drink. If you drink alcohol: Limit how much you have to 0-1 drink a day. Be aware of how much alcohol is in your drink. In the [place] one drink equals one 12 oz bottle of beer (355 mL), one 5 oz glass of wine (148 mL), or one 1 oz glass of hard liquor (44 mL). Lifestyle Take daily care of your teeth and gums. Brush your teeth every morning and night with fluoride toothpaste. Floss one time each day. Stay active. Exercise for at least 30 minutes 5 or more days each week. Do not use any products that contain nicotine or tobacco, such as cigarettes, e-cigarettes, and chewing tobacco. If you need help quitting, ask your health care provider. Do not use drugs. If you are sexually active, practice safe sex. Use a condom or other form of protection in order to prevent STIs (sexually transmitted infections). Talk with your health care provider about taking a low-dose aspirin or statin. Find healthy ways to cope with stress, such as: Meditation, yoga, or listening to music. Journaling. Talking to a trusted person. Spending time with friends and family. Safety Always wear your seat belt while driving or riding in a vehicle. Do not drive: If you have been drinking alcohol. Do not ride with someone who has been drinking. When you are tired or distracted. While texting. Wear a helmet and other protective equipment during sports  activities. If you have firearms in your house, make sure you follow all gun safety procedures. What's next? Visit your health care provider once a year for an annual wellness visit. Ask your health care provider how often you should have your eyes and teeth checked. Stay up to date on all vaccines. This information is not intended to replace advice given to you by your health care provider. Make sure you discuss any questions you have with your health care provider. Document Revised: 08/10/2020 Document Reviewed: 05/27/2018 Elsevier Patient Education  2022 [employer].

## 2021-03-14 NOTE — Progress Notes (Signed)
Patient ID: Morgan Medina, female    DOB: 16-Aug-1945  Age: 75 y.o. MRN: [id number]    Subjective:   Chief Complaint  Patient presents with   Annual Exam   Subjective  HPI Morgan Medina presents for office visit today for comprehensive physical exam today and follow up on management of chronic concerns. She has no recent febrile illnesses or ER visits to report. She is planning on seeing Dr. Orvan Seen at the eye clinic soon. Denies CP/palp/SOB/HA/congestion/fevers/GI or GU c/o. Taking meds as prescribed.  She has been feeling fatigue more than normal. When she runs out of iron supplements she feels more tired.  Review of Systems  Constitutional:  Positive for fatigue. Negative for chills and fever.  HENT:  Negative for congestion, rhinorrhea, sinus pressure, sinus pain and sore throat.   Eyes:  Negative for pain.  Respiratory:  Negative for cough and shortness of breath.   Cardiovascular:  Negative for chest pain, palpitations and leg swelling.  Gastrointestinal:  Negative for abdominal pain, blood in stool, diarrhea, nausea and vomiting.  Genitourinary:  Negative for decreased urine volume, flank pain, frequency, vaginal bleeding and vaginal discharge.  Musculoskeletal:  Negative for back pain.  Neurological:  Negative for headaches.   History Past Medical History:  Diagnosis Date   Anemia    Cerebral aneurysm    h/o in 2006   Dacrocystitis 07/19/2015   right   Diabetes mellitus type 2 in obese ([place]) 11/05/2014   GERD (gastroesophageal reflux disease) 04/17/2016   Hyperlipidemia    Hypertension    Osteoarthritis of left hip 12/02/2015   Osteopenia 11/22/2015   Overweight     She has a past surgical history that includes Abdominal hysterectomy and Cerebral aneurysm repair.   Her family history includes Alzheimer's disease in her mother and paternal grandmother; Cancer in her maternal aunt; Cerebral aneurysm in her father; Diabetes in her maternal grandmother; Stroke in her maternal  grandfather and paternal grandfather.She reports that she has never smoked. She has never used smokeless tobacco. She reports that she does not drink alcohol and does not use drugs.  Current Outpatient Medications on File Prior to Visit  Medication Sig Dispense Refill   amLODipine (NORVASC) 5 MG tablet TAKE 1 TABLET BY MOUTH EVERY DAY 90 tablet 0   aspirin 81 MG tablet Takes every other day     Blood Glucose Monitoring Suppl ([place]) W/DEVICE KIT Use to check blood sugar. DX. E11.9 1 each 0   carvedilol (COREG) 25 MG tablet TAKE 1 TABLET (25 MG TOTAL) BY MOUTH 2 (TWO) TIMES DAILY WITH A MEAL. 180 tablet 1   Cyanocobalamin (VITAMIN B 12) 250 MCG LOZG Take by mouth.     ferrous sulfate 325 (65 FE) MG tablet TAKE 1 TABLET BY MOUTH EVERY DAY WITH BREAKFAST 90 tablet 1   furosemide (LASIX) 40 MG tablet Take 1 tablet (40 mg total) by mouth 2 (two) times daily. 40 mg twice daily every other day. On the odd days 40 mg once daily. 200 tablet 3   losartan (COZAAR) 100 MG tablet Take 1 tablet (100 mg total) by mouth every evening. 90 tablet 1   metFORMIN (GLUCOPHAGE-XR) 500 MG 24 hr tablet Take 1 tablet (500 mg total) by mouth daily. 90 tablet 1   Multiple Vitamin (MULTIVITAMIN) capsule Take 1 capsule by mouth daily.     ONE TOUCH LANCETS MISC Use as directed once daily to check blood sugar. DX: E11.9 100 each 0   potassium  chloride SA (KLOR-CON M20) 20 MEQ tablet 20 meq twice daily every other day. On the odd days 20 meq once daily. 90 tablet 3   RESTASIS 0.05 % ophthalmic emulsion INSTILL 1 DROP INTO BOTH EYES TWICE A DAY  3   rosuvastatin (CRESTOR) 5 MG tablet Take 1 tablet (5 mg total) by mouth every other day. 16 tablet 3   No current facility-administered medications on file prior to visit.     Objective:  Objective  Physical Exam Constitutional:      General: She is not in acute distress.    Appearance: Normal appearance. She is not ill-appearing or toxic-appearing.  HENT:      Head: Normocephalic and atraumatic.     Right Ear: Tympanic membrane, ear canal and external ear normal.     Left Ear: Tympanic membrane, ear canal and external ear normal.     Nose: No congestion or rhinorrhea.  Eyes:     Extraocular Movements: Extraocular movements intact.     Right eye: No nystagmus.     Left eye: No nystagmus.     Pupils: Pupils are equal, round, and reactive to light.  Cardiovascular:     Rate and Rhythm: Normal rate and regular rhythm.     Pulses: Normal pulses.     Heart sounds: Normal heart sounds. No murmur heard. Pulmonary:     Effort: Pulmonary effort is normal. No respiratory distress.     Breath sounds: Normal breath sounds. No wheezing, rhonchi or rales.  Abdominal:     General: Bowel sounds are normal.     Palpations: Abdomen is soft. There is no mass.     Tenderness: There is no abdominal tenderness. There is no guarding.     Hernia: No hernia is present.  Musculoskeletal:        General: Normal range of motion.     Cervical back: Normal range of motion and neck supple.  Skin:    General: Skin is warm and dry.  Neurological:     Mental Status: She is alert and oriented to person, place, and time.     Cranial Nerves: No facial asymmetry.     Motor: Motor function is intact. No weakness.     Deep Tendon Reflexes:     Reflex Scores:      Patellar reflexes are 2+ on the right side and 2+ on the left side. Psychiatric:        Behavior: Behavior normal.   BP 122/70   Pulse 70   Temp 98.4 F (36.9 C)   Resp 16   Ht _0  (1.727 m)   Wt 201 lb 3.2 oz (91.3 kg)   SpO2 98%   BMI 30.59 kg/m  Wt Readings from Last 3 Encounters:  03/14/21 201 lb 3.2 oz (91.3 kg)  11/29/20 197 lb (89.4 kg)  11/19/20 198 lb 9.6 oz (90.1 kg)     Lab Results  Component Value Date   WBC 7.9 03/12/2021   HGB 11.4 (L) 03/12/2021   HCT 35.7 (L) 03/12/2021   PLT 255.0 03/12/2021   GLUCOSE 132 (H) 03/12/2021   CHOL 170 03/12/2021   TRIG 125.0 03/12/2021   HDL  50.30 03/12/2021   LDLDIRECT 181.0 01/03/2020   LDLCALC 95 03/12/2021   ALT 10 03/12/2021   AST 10 03/12/2021   NA 143 03/12/2021   K 3.9 03/12/2021   CL 101 03/12/2021   CREATININE 0.76 03/12/2021   BUN 13 03/12/2021   CO2 32 03/12/2021  TSH 3.94 03/12/2021   HGBA1C 7.3 (H) 03/12/2021    ECHOCARDIOGRAM COMPLETE  Result Date: 09/21/2020    ECHOCARDIOGRAM REPORT   Patient Name:   KAYLIE RITTER Date of Exam: 09/21/2020 Medical Rec #:  [id number]     Height:       68.0 in Accession #:    [id number]    Weight:       197.0 lb Date of Birth:  1945/10/03     BSA:          2.031 m Patient Age:    41 years      BP:           160/64 mmHg Patient Gender: F             HR:           55 bpm. Exam Location:  [place] Procedure: 2D Echo, 3D Echo, Cardiac Doppler and Color Doppler Indications:    R07.9* Chest pain, unspecified; R60.0 Lower extremity edema  History:        Patient has no prior history of Echocardiogram examinations.                 Risk Factors:Hypertension, Diabetes and Dyslipidemia.  Sonographer:    Geradine Girt Referring Phys: [place]  1. Sigmoid septum noted. Left ventricular ejection fraction, by estimation, is 60 to 65%. The left ventricle has normal function. The left ventricle has no regional wall motion abnormalities. There is mild left ventricular hypertrophy. Left ventricular diastolic parameters are consistent with Grade I diastolic dysfunction (impaired relaxation).  2. Right ventricular systolic function is normal. The right ventricular size is normal.  3. The mitral valve is normal in structure. No evidence of mitral valve regurgitation. No evidence of mitral stenosis.  4. The aortic valve is normal in structure. Aortic valve regurgitation is not visualized. No aortic stenosis is present.  5. The inferior vena cava is normal in size with greater than 50% respiratory variability, suggesting right atrial pressure of 3 mmHg. FINDINGS  Left Ventricle: Sigmoid septum  noted. Left ventricular ejection fraction, by estimation, is 60 to 65%. The left ventricle has normal function. The left ventricle has no regional wall motion abnormalities. The left ventricular internal cavity size was normal in size. There is mild left ventricular hypertrophy. Left ventricular diastolic parameters are consistent with Grade I diastolic dysfunction (impaired relaxation). Right Ventricle: The right ventricular size is normal. No increase in right ventricular wall thickness. Right ventricular systolic function is normal. Left Atrium: Left atrial size was normal in size. Right Atrium: Right atrial size was normal in size. Pericardium: There is no evidence of pericardial effusion. Mitral Valve: The mitral valve is normal in structure. No evidence of mitral valve regurgitation. No evidence of mitral valve stenosis. Tricuspid Valve: The tricuspid valve is normal in structure. Tricuspid valve regurgitation is not demonstrated. No evidence of tricuspid stenosis. Aortic Valve: The aortic valve is normal in structure. Aortic valve regurgitation is not visualized. No aortic stenosis is present. Pulmonic Valve: The pulmonic valve was normal in structure. Pulmonic valve regurgitation is not visualized. No evidence of pulmonic stenosis. Aorta: The aortic root is normal in size and structure. Venous: The inferior vena cava is normal in size with greater than 50% respiratory variability, suggesting right atrial pressure of 3 mmHg. IAS/Shunts: No atrial level shunt detected by color flow Doppler.  LEFT VENTRICLE PLAX 2D LVIDd:         4.15 cm  Diastology  LVIDs:         2.67 cm  LV e' medial:    4.46 cm/s LV PW:         1.01 cm  LV E/e' medial:  18.5 LV IVS:        1.28 cm  LV e' lateral:   9.25 cm/s LVOT diam:     2.00 cm  LV E/e' lateral: 8.9 LV SV:         74 LV SV Index:   37 LVOT Area:     3.14 cm                          3D Volume EF:                         3D EF:        69 %                         LV EDV:        114 ml                         LV ESV:       35 ml                         LV SV:        79 ml RIGHT VENTRICLE RV S prime:     10.90 cm/s TAPSE (M-mode): 3.3 cm LEFT ATRIUM             Index       RIGHT ATRIUM           Index LA Vol (A2C):   27.1 ml 13.35 ml/m RA Area:     11.00 cm LA Vol (A4C):   37.2 ml 18.32 ml/m RA Volume:   19.70 ml  9.70 ml/m LA Biplane Vol: 34.2 ml 16.84 ml/m  AORTIC VALVE LVOT Vmax:   96.60 cm/s LVOT Vmean:  56.200 cm/s LVOT VTI:    0.237 m  AORTA Ao Root diam: 2.70 cm Ao Asc diam:  2.90 cm MITRAL VALVE MV Area (PHT): 2.96 cm     SHUNTS MV Decel Time: 256 msec     Systemic VTI:  0.24 m MV E velocity: 82.70 cm/s   Systemic Diam: 2.00 cm MV A velocity: 108.00 cm/s MV E/A ratio:  0.77 Jenne Campus MD Electronically signed by Jenne Campus MD Signature Date/Time: 09/21/2020/12:01:54 PM    Final      Assessment & Plan:  Plan    No orders of the defined types were placed in this encounter.   Problem List Items Addressed This Visit     Anemia    Increase leafy greens, consider increased lean red meat and using cast iron cookware. Continue to monitor, report any concerns. Continue to monitor cbc and iron studies      Relevant Orders   CBC   Iron, TIBC and Ferritin Panel   HTN (hypertension)    Well controlled, no changes to meds. Encouraged heart healthy diet such as the DASH diet and exercise as tolerated.       Relevant Orders   CBC   TSH   T4, free   Hyperlipidemia    Tolerating statin, encouraged heart healthy diet, avoid trans fats, minimize simple carbs and saturated fats. Increase  exercise as tolerated      Relevant Orders   Lipid panel   Diabetes mellitus type 2 in obese (HCC)    hgba1c acceptable, minimize simple carbs. Increase exercise as tolerated. Continue current meds      Relevant Orders   Comprehensive metabolic panel   Preventative health care    Patient encouraged to maintain heart healthy diet, regular exercise, adequate  sleep. Consider daily probiotics. Take medications as prescribed. Labs ordered and reviewed. Given flu shot, had her colon[facility] last month will request records. Mgm 01/12/20 repeat by summer of 2023. Discussed need for tdap, shingrix and covid booster, given flu shot today       Osteopenia    Encouraged to get adequate exercise, calcium and vitamin d intake      Cataracts, bilateral   Vitamin D deficiency    Supplement and monitor      Relevant Orders   VITAMIN D 25 Hydroxy (Vit-D Deficiency, Fractures)   High serum vitamin B12    monitor      Relevant Orders   Vitamin B12   Type 2 diabetes mellitus with hyperosmolarity without coma, without long-term current use of insulin (HCC)   Relevant Orders   TSH   T4, free   Hemoglobin A1c   Coronary artery disease involving native coronary artery of native heart without angina pectoris   Relevant Orders   CBC   TSH   T4, free   Other Visit Diagnoses     Need for influenza vaccination    -  Primary   Relevant Orders   Flu Vaccine QUAD High Dose(Fluad) (Completed)       Follow-up: Return in about 1 month (around 04/16/2021), or lab appt 04/16/21, lab appt then 07/04/2021, f/u after that.  I, Suezanne Jacquet, acting as a scribe for Penni Homans, MD, have documented all relevent documentation on behalf of Penni Homans, MD, as directed by Penni Homans, MD while in the presence of Penni Homans, MD. DO:03/15/21.  I, Mosie Lukes, MD personally performed the services described in this documentation. All medical record entries made by the scribe were at my direction and in my presence. I have reviewed the chart and agree that the record reflects my personal performance and is accurate and complete

## 2021-03-15 NOTE — Assessment & Plan Note (Signed)
Encouraged to get adequate exercise, calcium and vitamin d intake 

## 2021-03-15 NOTE — Assessment & Plan Note (Signed)
monitor

## 2021-03-15 NOTE — Assessment & Plan Note (Signed)
Patient encouraged to maintain heart healthy diet, regular exercise, adequate sleep. Consider daily probiotics. Take medications as prescribed. Labs ordered and reviewed. Given flu shot, had her colonoscopy at [facility] last month will request records. Mgm 01/12/20 repeat by summer of 2023. Discussed need for tdap, shingrix and covid booster, given flu shot today

## 2021-03-15 NOTE — Assessment & Plan Note (Signed)
Supplement and monitor 

## 2021-03-15 NOTE — Assessment & Plan Note (Signed)
Increase leafy greens, consider increased lean red meat and using cast iron cookware. Continue to monitor, report any concerns. Continue to monitor cbc and iron studies

## 2021-03-15 NOTE — Assessment & Plan Note (Signed)
Tolerating statin, encouraged heart healthy diet, avoid trans fats, minimize simple carbs and saturated fats. Increase exercise as tolerated 

## 2021-03-15 NOTE — Assessment & Plan Note (Signed)
hgba1c acceptable, minimize simple carbs. Increase exercise as tolerated. Continue current meds 

## 2021-03-15 NOTE — Assessment & Plan Note (Signed)
Well controlled, no changes to meds. Encouraged heart healthy diet such as the DASH diet and exercise as tolerated.  °

## 2021-03-27 DIAGNOSIS — D126 Benign neoplasm of colon, unspecified: Secondary | ICD-10-CM | POA: Diagnosis not present

## 2021-03-27 DIAGNOSIS — K648 Other hemorrhoids: Secondary | ICD-10-CM | POA: Diagnosis not present

## 2021-03-27 LAB — HM COLONOSCOPY

## 2021-03-29 ENCOUNTER — Other Ambulatory Visit: Payer: Self-pay | Admitting: Cardiology

## 2021-04-09 DIAGNOSIS — Z961 Presence of intraocular lens: Secondary | ICD-10-CM | POA: Diagnosis not present

## 2021-04-09 DIAGNOSIS — H04123 Dry eye syndrome of bilateral lacrimal glands: Secondary | ICD-10-CM | POA: Diagnosis not present

## 2021-04-09 DIAGNOSIS — H5213 Myopia, bilateral: Secondary | ICD-10-CM | POA: Diagnosis not present

## 2021-04-09 DIAGNOSIS — E119 Type 2 diabetes mellitus without complications: Secondary | ICD-10-CM | POA: Diagnosis not present

## 2021-04-09 DIAGNOSIS — H52223 Regular astigmatism, bilateral: Secondary | ICD-10-CM | POA: Diagnosis not present

## 2021-04-09 LAB — HM DIABETES EYE EXAM

## 2021-04-17 ENCOUNTER — Other Ambulatory Visit (INDEPENDENT_AMBULATORY_CARE_PROVIDER_SITE_OTHER): Payer: Medicare PPO

## 2021-04-17 ENCOUNTER — Encounter: Payer: Self-pay | Admitting: *Deleted

## 2021-04-17 ENCOUNTER — Telehealth: Payer: Self-pay | Admitting: Family Medicine

## 2021-04-17 ENCOUNTER — Other Ambulatory Visit: Payer: Self-pay

## 2021-04-17 ENCOUNTER — Other Ambulatory Visit: Payer: Self-pay | Admitting: *Deleted

## 2021-04-17 DIAGNOSIS — E782 Mixed hyperlipidemia: Secondary | ICD-10-CM | POA: Diagnosis not present

## 2021-04-17 DIAGNOSIS — E1100 Type 2 diabetes mellitus with hyperosmolarity without nonketotic hyperglycemic-hyperosmolar coma (NKHHC): Secondary | ICD-10-CM

## 2021-04-17 DIAGNOSIS — E559 Vitamin D deficiency, unspecified: Secondary | ICD-10-CM

## 2021-04-17 DIAGNOSIS — I10 Essential (primary) hypertension: Secondary | ICD-10-CM

## 2021-04-17 DIAGNOSIS — R7989 Other specified abnormal findings of blood chemistry: Secondary | ICD-10-CM

## 2021-04-17 DIAGNOSIS — E1169 Type 2 diabetes mellitus with other specified complication: Secondary | ICD-10-CM

## 2021-04-17 DIAGNOSIS — D649 Anemia, unspecified: Secondary | ICD-10-CM

## 2021-04-17 DIAGNOSIS — I2510 Atherosclerotic heart disease of native coronary artery without angina pectoris: Secondary | ICD-10-CM | POA: Diagnosis not present

## 2021-04-17 DIAGNOSIS — E669 Obesity, unspecified: Secondary | ICD-10-CM

## 2021-04-17 LAB — COMPREHENSIVE METABOLIC PANEL
ALT: 9 U/L (ref 0–35)
AST: 11 U/L (ref 0–37)
Albumin: 4.4 g/dL (ref 3.5–5.2)
Alkaline Phosphatase: 60 U/L (ref 39–117)
BUN: 14 mg/dL (ref 6–23)
CO2: 32 mEq/L (ref 19–32)
Calcium: 9.5 mg/dL (ref 8.4–10.5)
Chloride: 103 mEq/L (ref 96–112)
Creatinine, Ser: 0.75 mg/dL (ref 0.40–1.20)
GFR: 78.87 mL/min (ref >60.00)
Glucose, Bld: 138 mg/dL — ABNORMAL HIGH (ref 70–99)
Potassium: 3.8 mEq/L (ref 3.5–5.1)
Sodium: 142 mEq/L (ref 135–145)
Total Bilirubin: 0.4 mg/dL (ref 0.2–1.2)
Total Protein: 7.2 g/dL (ref 6.0–8.3)

## 2021-04-17 LAB — TSH: TSH: 2.59 u[IU]/mL (ref 0.35–5.50)

## 2021-04-17 LAB — LIPID PANEL
Cholesterol: 191 mg/dL (ref 0–200)
HDL: 51.9 mg/dL (ref >39.00)
LDL Cholesterol: 117 mg/dL — ABNORMAL HIGH (ref 0–99)
NonHDL: 139.34
Total CHOL/HDL Ratio: 4
Triglycerides: 112 mg/dL (ref 0.0–149.0)
VLDL: 22.4 mg/dL (ref 0.0–40.0)

## 2021-04-17 LAB — CBC
HCT: 37 % (ref 36.0–46.0)
Hemoglobin: 11.5 g/dL — ABNORMAL LOW (ref 12.0–15.0)
MCHC: 31 g/dL (ref 30.0–36.0)
MCV: 74.7 fl — ABNORMAL LOW (ref 78.0–100.0)
Platelets: 254 10*3/uL (ref 150.0–400.0)
RBC: 4.95 Mil/uL (ref 3.87–5.11)
RDW: 13.7 % (ref 11.5–15.5)
WBC: 6.9 10*3/uL (ref 4.0–10.5)

## 2021-04-17 LAB — HEMOGLOBIN A1C: Hgb A1c MFr Bld: 7.2 % — ABNORMAL HIGH (ref 4.6–6.5)

## 2021-04-17 LAB — T4, FREE: Free T4: 0.7 ng/dL (ref 0.60–1.60)

## 2021-04-17 LAB — VITAMIN B12: Vitamin B-12: 926 pg/mL — ABNORMAL HIGH (ref 211–911)

## 2021-04-17 LAB — VITAMIN D 25 HYDROXY (VIT D DEFICIENCY, FRACTURES): VITD: 30.88 ng/mL (ref 30.00–100.00)

## 2021-04-17 NOTE — Telephone Encounter (Signed)
Abstracted into chart.

## 2021-04-17 NOTE — Telephone Encounter (Signed)
Pt dropped off Diabetes Communication Report (1 page in envelope) for provider to see and have on pt's chart. Document put at front office tray under providers name.

## 2021-04-18 LAB — IRON,TIBC AND FERRITIN PANEL
%SAT: 34 % (calc) (ref 16–45)
Ferritin: 543 ng/mL — ABNORMAL HIGH (ref 16–288)
Iron: 120 ug/dL (ref 45–160)
TIBC: 354 mcg/dL (calc) (ref 250–450)

## 2021-05-20 ENCOUNTER — Other Ambulatory Visit: Payer: Self-pay | Admitting: Family Medicine

## 2021-06-11 ENCOUNTER — Other Ambulatory Visit: Payer: Self-pay | Admitting: Family Medicine

## 2021-06-11 MED ORDER — AMLODIPINE BESYLATE 5 MG PO TABS: 5.0000 mg | ORAL_TABLET | Freq: Every day | ORAL | 1 refills | Status: DC

## 2021-07-19 ENCOUNTER — Telehealth: Payer: Self-pay | Admitting: Family Medicine

## 2021-07-19 NOTE — Telephone Encounter (Signed)
Had refills on script needs to call pharmacy

## 2021-07-19 NOTE — Telephone Encounter (Signed)
Advised pt to contact pharmacy

## 2021-07-19 NOTE — Telephone Encounter (Signed)
Medication:  carvedilol (COREG) 25 MG tablet [[id number]]     Has the patient contacted their pharmacy? No. (If no, request that the patient contact the pharmacy for the refill.) (If yes, when and what did the pharmacy advise?)     Preferred Pharmacy (with phone number or street name):  [id number] [place], [address]  [place], [place] [address]  Phone:  [phone]  Fax:  [phone]   Agent: Please be advised that RX refills may take up to 3 business days. We ask that you follow-up with your pharmacy.

## 2021-07-25 ENCOUNTER — Ambulatory Visit: Payer: Medicare PPO | Admitting: Family Medicine

## 2021-08-12 ENCOUNTER — Ambulatory Visit: Payer: Medicare PPO | Admitting: Family Medicine

## 2021-09-10 ENCOUNTER — Emergency Department (HOSPITAL_BASED_OUTPATIENT_CLINIC_OR_DEPARTMENT_OTHER)
Admission: EM | Admit: 2021-09-10 | Discharge: 2021-09-10 | Disposition: A | Discharge status: Home / Self Care | Payer: Medicare PPO | Attending: Emergency Medicine | Admitting: Emergency Medicine

## 2021-09-10 ENCOUNTER — Other Ambulatory Visit: Payer: Self-pay

## 2021-09-10 ENCOUNTER — Encounter (HOSPITAL_BASED_OUTPATIENT_CLINIC_OR_DEPARTMENT_OTHER): Payer: Self-pay

## 2021-09-10 DIAGNOSIS — I10 Essential (primary) hypertension: Secondary | ICD-10-CM | POA: Diagnosis not present

## 2021-09-10 DIAGNOSIS — J069 Acute upper respiratory infection, unspecified: Secondary | ICD-10-CM | POA: Insufficient documentation

## 2021-09-10 DIAGNOSIS — Z20822 Contact with and (suspected) exposure to covid-19: Secondary | ICD-10-CM | POA: Insufficient documentation

## 2021-09-10 DIAGNOSIS — E119 Type 2 diabetes mellitus without complications: Secondary | ICD-10-CM | POA: Insufficient documentation

## 2021-09-10 DIAGNOSIS — R059 Cough, unspecified: Secondary | ICD-10-CM | POA: Diagnosis present

## 2021-09-10 LAB — RESP PANEL BY RT-PCR (FLU A&B, COVID) ARPGX2
Influenza A by PCR: NEGATIVE
Influenza B by PCR: NEGATIVE
SARS Coronavirus 2 by RT PCR: NEGATIVE

## 2021-09-10 MED ORDER — AZITHROMYCIN 250 MG PO TABS: 250.0000 mg | ORAL_TABLET | Freq: Every day | ORAL | 0 refills | Status: DC

## 2021-09-10 NOTE — Discharge Instructions (Addendum)

## 2021-09-10 NOTE — ED Triage Notes (Addendum)
Pt c/o flu like sx day 3-NAD-steady gait ?

## 2021-09-10 NOTE — ED Provider Notes (Signed)
? ?Emergency Department Provider Note ? ? ?I have reviewed the triage vital signs and the nursing notes. ? ? ?HISTORY ? ?Chief Complaint ?Cough ? ? ?HPI ?Stephani Verne is a 76 y.o. female past medical history reviewed below presents emergency department with flulike symptoms over the past 3 days.  Patient denies any chest pain or shortness of breath.  She has had congestion in her nose and chest.  She has been coughing.  She describes body aches along with some subjective fever.  No abdominal pain, vomiting, diarrhea.  No known sick contacts. ? ? ?Past Medical History:  ?Diagnosis Date  ? Anemia   ? Cerebral aneurysm   ? h/o in 2006  ? Dacrocystitis 07/19/2015  ? right  ? Diabetes mellitus type 2 in obese ([place]) 11/05/2014  ? GERD (gastroesophageal reflux disease) 04/17/2016  ? Hyperlipidemia   ? Hypertension   ? Osteoarthritis of left hip 12/02/2015  ? Osteopenia 11/22/2015  ? Overweight   ? ? ?Review of Systems ? ?Constitutional: Positive chills and body aches ?Eyes: No visual changes. ?ENT: Mild sore throat. ?Cardiovascular: Denies chest pain. ?Respiratory: Denies shortness of breath. Positive cough.  ?Gastrointestinal: No abdominal pain.  No nausea, no vomiting.  No diarrhea.  No constipation. ?Genitourinary: Negative for dysuria. ?Musculoskeletal: Negative for back pain. ?Skin: Negative for rash. ?Neurological: Negative for focal weakness or numbness. Positive HA.  ? ? ?____________________________________________ ? ? ?PHYSICAL EXAM: ? ?VITAL SIGNS: ?ED Triage Vitals  ?Enc Vitals Group  ?   BP 09/10/21 2128 (!) 177/84  ?   Pulse Rate 09/10/21 2128 80  ?   Resp 09/10/21 2128 16  ?   Temp 09/10/21 2128 98.5 ?F (36.9 ?C)  ?   Temp Source 09/10/21 2128 Oral  ?   SpO2 09/10/21 2128 92 %  ? ?Constitutional: Alert and oriented. Well appearing and in no acute distress. ?Eyes: Conjunctivae are normal.  ?Head: Atraumatic. ?Nose: Mild congestion/rhinnorhea. ?Mouth/Throat: Mucous membranes are moist.  Oropharynx  non-erythematous. ?Neck: No stridor.   ?Cardiovascular: Normal rate, regular rhythm. Good peripheral circulation. Grossly normal heart sounds.   ?Respiratory: Normal respiratory effort.  No retractions. Lungs CTAB. ?Gastrointestinal: No distention.  ?Musculoskeletal: No gross deformities of extremities. ?Neurologic:  Normal speech and language.  ?Skin:  Skin is warm, dry and intact. No rash noted. ? ? ?____________________________________________ ?  ?LABS ?(all labs ordered are listed, but only abnormal results are displayed) ? ?Labs Reviewed  ?RESP PANEL BY RT-PCR (FLU A&B, COVID) [id number]  ? ?____________________________________________ ? ? ?PROCEDURES ? ?Procedure(s) performed:  ? ?Procedures ? ?None  ?____________________________________________ ? ? ?INITIAL IMPRESSION / ASSESSMENT AND PLAN / ED COURSE ? ?Pertinent labs & imaging results that were available during my care of the patient were reviewed by me and considered in my medical decision making (see chart for details). ?  ?This patient is Presenting for Evaluation of URI, which does require a range of treatment options, and is a complaint that involves a high risk of morbidity and mortality. ? ?The Differential Diagnoses include CAP, COVID, Flu, sepsis. ? ?  ?Clinical Laboratory Tests Ordered, included COVID and Flu PCR negative.  ? ?Radiologic Tests: Considered chest x-ray but patient is very well-appearing with normal vital signs including oxygen saturation on room air.  She is not having increased work of breathing and lungs are clear.  Defer chest imaging for now. ? ?Medical Decision Making: Summary:  ?Patient presents to the emergency department for evaluation of upper respiratory infection symptoms.  COVID and flu  are negative.  Plan for treatment of likely bronchitis.  We will start a Z-Pak and discussed supportive care measures at home along with PCP follow-up. ? ?Disposition: discharge ? ?____________________________________________ ? ?FINAL  CLINICAL IMPRESSION(S) / ED DIAGNOSES ? ?Final diagnoses:  ?Upper respiratory tract infection, unspecified type  ? ? ? ?NEW OUTPATIENT MEDICATIONS STARTED DURING THIS VISIT: ? ?New Prescriptions  ? AZITHROMYCIN (ZITHROMAX) 250 MG TABLET    Take 1 tablet (250 mg total) by mouth daily. Take first 2 tablets together, then 1 every day until finished.  ? ? ?Note:  This document was prepared using Dragon voice recognition software and may include unintentional dictation errors. ? ?Nanda Quinton, MD, FACEP ?Emergency Medicine ? ?  ?Margette Fast, MD ?09/13/21 2243 ? ?

## 2021-09-11 ENCOUNTER — Telehealth: Payer: Self-pay

## 2021-09-11 NOTE — Telephone Encounter (Signed)
Nurse Assessment ?Nurse: Idolina Primer, RN, Tiffani Date/Time Eilene Ghazi Time): 09/10/2021 8:11:44 PM ?Confirm and document reason for call. If ?symptomatic, describe symptoms. ?---Caller is calling for a appt. to be scheduled for ?tomorrow. The caller is having symptoms like ?Coughing, shortness of breath, headache and ?stuffiness. Symptoms started 3 days ago. Fever at ?times but does not know what it is. states she thinks it ?is a cold. has chills. ?Does the patient have any new or worsening ?symptoms? ---Yes ?Will a triage be completed? ---Yes ?Related visit to physician within the last 2 weeks? ---No ?Does the PT have any chronic conditions? (i.e. ?diabetes, asthma, this includes High risk factors for ?pregnancy, etc.) ?---No ?Is this a behavioral health or substance abuse call? ---No ?Guidelines ?Guideline Title Affirmed Question Affirmed Notes Nurse Date/Time (Eastern ?Time) ?COVID-19 - ?Diagnosed or ?Suspected ?MILD difficulty ?breathing (e.g., ?minimal/no SOB ?at rest, SOB with ?walking, pulse <100) ?Idolina Primer, RN, Tiffani 09/10/2021 8:13:18 ?PM ?PLEASE NOTE: All timestamps contained within this report are represented as [place] Standard Time. ?CONFIDENTIALTY NOTICE: This fax transmission is intended only for the addressee. It contains information that is legally privileged, confidential or ?otherwise protected from use or disclosure. If you are not the intended recipient, you are strictly prohibited from reviewing, disclosing, copying using ?or disseminating any of this information or taking any action in reliance on or regarding this information. If you have received this fax in error, please ?notify us immediately by telephone so that we can arrange for its return to [place]. Phone: [phone], Toll-Free: [phone], Fax: [phone] ?Page: 2 of 2 ?Call Id: [id number] ?Disp. Time (Eastern ?Time) Disposition Final User ?09/10/2021 8:10:42 PM Send to Urgent Queue Algie Coffer ?09/10/2021 8:19:34 PM See HCP within 4 Hours  (or ?PCP triage) ?Yes Dunn, RN, Tiffani ?Caller Disagree/Comply Comply ?Caller Understands Yes ?PreDisposition Call Doctor ?Care Advice Given Per Guideline ?SEE HCP (OR PCP TRIAGE) WITHIN 4 HOURS: * IF OFFICE WILL BE CLOSED AND NO PCP (PRIMARY CARE ?PROVIDER) SECOND-LEVEL TRIAGE: You need to be seen within the next 3 or 4 hours. A nearby Urgent [facility]) ?is often a good source of care. Another choice is to go to the ED. Go sooner if you become worse. CALL BACK IF: * You become ?worse CARE ADVICE given per COVID-19 - DIAGNOSED OR SUSPECTED (Adult) guideline. ?Comments ?User: Brantley Fling, RN Date/Time Eilene Ghazi Time): 09/10/2021 8:12:49 PM ?Caller requesting covid test, was on a plane on friday ?Referrals ?[place] - ED ?

## 2021-09-12 DIAGNOSIS — E119 Type 2 diabetes mellitus without complications: Secondary | ICD-10-CM | POA: Diagnosis not present

## 2021-09-12 DIAGNOSIS — H04123 Dry eye syndrome of bilateral lacrimal glands: Secondary | ICD-10-CM | POA: Diagnosis not present

## 2021-09-12 DIAGNOSIS — H52223 Regular astigmatism, bilateral: Secondary | ICD-10-CM | POA: Diagnosis not present

## 2021-09-12 DIAGNOSIS — Z961 Presence of intraocular lens: Secondary | ICD-10-CM | POA: Diagnosis not present

## 2021-09-12 DIAGNOSIS — H5213 Myopia, bilateral: Secondary | ICD-10-CM | POA: Diagnosis not present

## 2021-09-12 DIAGNOSIS — H16203 Unspecified keratoconjunctivitis, bilateral: Secondary | ICD-10-CM | POA: Diagnosis not present

## 2021-09-16 DIAGNOSIS — Z961 Presence of intraocular lens: Secondary | ICD-10-CM | POA: Diagnosis not present

## 2021-09-16 DIAGNOSIS — H5213 Myopia, bilateral: Secondary | ICD-10-CM | POA: Diagnosis not present

## 2021-09-16 DIAGNOSIS — H16203 Unspecified keratoconjunctivitis, bilateral: Secondary | ICD-10-CM | POA: Diagnosis not present

## 2021-09-16 DIAGNOSIS — H52223 Regular astigmatism, bilateral: Secondary | ICD-10-CM | POA: Diagnosis not present

## 2021-09-16 DIAGNOSIS — E119 Type 2 diabetes mellitus without complications: Secondary | ICD-10-CM | POA: Diagnosis not present

## 2021-09-16 DIAGNOSIS — H04123 Dry eye syndrome of bilateral lacrimal glands: Secondary | ICD-10-CM | POA: Diagnosis not present

## 2021-09-19 ENCOUNTER — Other Ambulatory Visit: Payer: Self-pay | Admitting: Family Medicine

## 2021-10-07 ENCOUNTER — Other Ambulatory Visit: Payer: Self-pay | Admitting: Cardiology

## 2021-10-09 NOTE — Progress Notes (Deleted)
? ?Subjective:  ? ? Patient ID: Morgan Medina, female    DOB: 08-03-1945, 76 y.o.   MRN: [id number] ? ?No chief complaint on file. ? ? ?HPI ?Patient is in today for a follow up. ? ?Past Medical History:  ?Diagnosis Date  ? Anemia   ? Cerebral aneurysm   ? h/o in 2006  ? Dacrocystitis 07/19/2015  ? right  ? Diabetes mellitus type 2 in obese ([place]) 11/05/2014  ? GERD (gastroesophageal reflux disease) 04/17/2016  ? Hyperlipidemia   ? Hypertension   ? Osteoarthritis of left hip 12/02/2015  ? Osteopenia 11/22/2015  ? Overweight   ? ? ?Past Surgical History:  ?Procedure Laterality Date  ? ABDOMINAL HYSTERECTOMY    ? CEREBRAL ANEURYSM REPAIR    ? ? ?Family History  ?Problem Relation Age of Onset  ? Cerebral aneurysm Father   ? Alzheimer's disease Mother   ? Cancer Maternal Aunt   ?     breast  ? Diabetes Maternal Grandmother   ? Stroke Maternal Grandfather   ? Alzheimer's disease Paternal Grandmother   ? Stroke Paternal Grandfather   ? ? ?Social History  ? ?Socioeconomic History  ? Marital status: Widowed  ?  Spouse name: Not on file  ? Number of children: Not on file  ? Years of education: Not on file  ? Highest education level: Not on file  ?Occupational History  ? Occupation: retired  ?Tobacco Use  ? Smoking status: Never  ? Smokeless tobacco: Never  ?Vaping Use  ? Vaping Use: Never used  ?Substance and Sexual Activity  ? Alcohol use: No  ? Drug use: No  ? Sexual activity: Never  ?  Comment: lives with self, no dietary restrictions. works as a Theme park manager, retired from state employment  ?Other Topics Concern  ? Not on file  ?Social History Narrative  ? Not on file  ? ?Social Determinants of Health  ? ?Financial Resource Strain: Low Risk   ? Difficulty of Paying Living Expenses: Not hard at all  ?Food Insecurity: No Food Insecurity  ? Worried About Charity fundraiser in the Last Year: Never true  ? Ran Out of Food in the Last Year: Never true  ?Transportation Needs: No Transportation Needs  ? Lack of Transportation (Medical): No  ?  Lack of Transportation (Non-Medical): No  ?Physical Activity: Inactive  ? Days of Exercise per Week: 0 days  ? Minutes of Exercise per Session: 0 min  ?Stress: No Stress Concern Present  ? Feeling of Stress : Not at all  ?Social Connections: Moderately Isolated  ? Frequency of Communication with Friends and Family: More than three times a week  ? Frequency of Social Gatherings with Friends and Family: More than three times a week  ? Attends Religious Services: More than 4 times per year  ? Active Member of Clubs or Organizations: No  ? Attends Archivist Meetings: Never  ? Marital Status: Widowed  ?Intimate Partner Violence: Not At Risk  ? Fear of Current or Ex-Partner: No  ? Emotionally Abused: No  ? Physically Abused: No  ? Sexually Abused: No  ? ? ?Outpatient Medications Prior to Visit  ?Medication Sig Dispense Refill  ? amLODipine (NORVASC) 5 MG tablet Take 1 tablet (5 mg total) by mouth daily. 90 tablet 1  ? aspirin 81 MG tablet Takes every other day    ? azithromycin (ZITHROMAX) 250 MG tablet Take 1 tablet (250 mg total) by mouth daily. Take first 2 tablets together,  then 1 every day until finished. 6 tablet 0  ? Blood Glucose Monitoring Suppl ([place]) W/DEVICE KIT Use to check blood sugar. DX. E11.9 1 each 0  ? carvedilol (COREG) 25 MG tablet TAKE 1 TABLET (25 MG TOTAL) BY MOUTH 2 (TWO) TIMES DAILY WITH A MEAL. 180 tablet 1  ? Cyanocobalamin (VITAMIN B 12) 250 MCG LOZG Take by mouth.    ? ferrous sulfate 325 (65 FE) MG tablet TAKE 1 TABLET BY MOUTH EVERY DAY WITH BREAKFAST 90 tablet 1  ? furosemide (LASIX) 40 MG tablet Take 1 tablet (40 mg total) by mouth 2 (two) times daily. 40 mg twice daily every other day. On the odd days 40 mg once daily. 200 tablet 3  ? losartan (COZAAR) 100 MG tablet TAKE 1 TABLET BY MOUTH EVERY EVENING. 90 tablet 1  ? metFORMIN (GLUCOPHAGE-XR) 500 MG 24 hr tablet Take 1 tablet (500 mg total) by mouth daily. 90 tablet 1  ? Multiple Vitamin (MULTIVITAMIN)  capsule Take 1 capsule by mouth daily.    ? ONE TOUCH LANCETS MISC Use as directed once daily to check blood sugar. DX: E11.9 100 each 0  ? potassium chloride SA (KLOR-CON M20) 20 MEQ tablet TAKE 1 TABLET BY MOUTH EVERY OTHER DAY 45 tablet 0  ? RESTASIS 0.05 % ophthalmic emulsion INSTILL 1 DROP INTO BOTH EYES TWICE A DAY  3  ? rosuvastatin (CRESTOR) 5 MG tablet Take 1 tablet (5 mg total) by mouth every other day. 16 tablet 3  ? ?No facility-administered medications prior to visit.  ? ? ?No Known Allergies ? ?ROS ? ?   ?Objective:  ?  ?Physical Exam ? ?There were no vitals taken for this visit. ?Wt Readings from Last 3 Encounters:  ?03/14/21 201 lb 3.2 oz (91.3 kg)  ?11/29/20 197 lb (89.4 kg)  ?11/19/20 198 lb 9.6 oz (90.1 kg)  ? ? ?Diabetic Foot Exam - Simple   ?No data filed ?  ? ?Lab Results  ?Component Value Date  ? WBC 6.9 04/17/2021  ? HGB 11.5 (L) 04/17/2021  ? HCT 37.0 04/17/2021  ? PLT 254.0 04/17/2021  ? GLUCOSE 138 (H) 04/17/2021  ? CHOL 191 04/17/2021  ? TRIG 112.0 04/17/2021  ? HDL 51.90 04/17/2021  ? LDLDIRECT 181.0 01/03/2020  ? LDLCALC 117 (H) 04/17/2021  ? ALT 9 04/17/2021  ? AST 11 04/17/2021  ? NA 142 04/17/2021  ? K 3.8 04/17/2021  ? CL 103 04/17/2021  ? CREATININE 0.75 04/17/2021  ? BUN 14 04/17/2021  ? CO2 32 04/17/2021  ? TSH 2.59 04/17/2021  ? HGBA1C 7.2 (H) 04/17/2021  ? ? ?Lab Results  ?Component Value Date  ? TSH 2.59 04/17/2021  ? ?Lab Results  ?Component Value Date  ? WBC 6.9 04/17/2021  ? HGB 11.5 (L) 04/17/2021  ? HCT 37.0 04/17/2021  ? MCV 74.7 (L) 04/17/2021  ? PLT 254.0 04/17/2021  ? ?Lab Results  ?Component Value Date  ? NA 142 04/17/2021  ? K 3.8 04/17/2021  ? CO2 32 04/17/2021  ? GLUCOSE 138 (H) 04/17/2021  ? BUN 14 04/17/2021  ? CREATININE 0.75 04/17/2021  ? BILITOT 0.4 04/17/2021  ? ALKPHOS 60 04/17/2021  ? AST 11 04/17/2021  ? ALT 9 04/17/2021  ? PROT 7.2 04/17/2021  ? ALBUMIN 4.4 04/17/2021  ? CALCIUM 9.5 04/17/2021  ? ANIONGAP 9 12/04/2017  ? EGFR 80 11/19/2020  ? GFR 78.87  04/17/2021  ? ?Lab Results  ?Component Value Date  ? CHOL  191 04/17/2021  ? ?Lab Results  ?Component Value Date  ? HDL 51.90 04/17/2021  ? ?Lab Results  ?Component Value Date  ? LDLCALC 117 (H) 04/17/2021  ? ?Lab Results  ?Component Value Date  ? TRIG 112.0 04/17/2021  ? ?Lab Results  ?Component Value Date  ? CHOLHDL 4 04/17/2021  ? ?Lab Results  ?Component Value Date  ? HGBA1C 7.2 (H) 04/17/2021  ? ? ?   ?Assessment & Plan:  ? ?Problem List Items Addressed This Visit   ?None ? ? ?I am having Syanna Barrasso maintain her multivitamin, Vitamin B 12, aspirin, ONE TOUCH LANCETS, ONE TOUCH BASIC SYSTEM, ferrous sulfate, Restasis, rosuvastatin, furosemide, metFORMIN, carvedilol, amLODipine, azithromycin, losartan, and potassium chloride SA. ? ?No orders of the defined types were placed in this encounter. ? ? ? ? ?

## 2021-10-10 ENCOUNTER — Ambulatory Visit (HOSPITAL_BASED_OUTPATIENT_CLINIC_OR_DEPARTMENT_OTHER)
Admission: RE | Admit: 2021-10-10 | Discharge: 2021-10-10 | Disposition: A | Discharge status: Home / Self Care | Payer: Medicare PPO | Source: Ambulatory Visit | Attending: Family Medicine | Admitting: Family Medicine

## 2021-10-10 ENCOUNTER — Encounter: Payer: Self-pay | Admitting: Family Medicine

## 2021-10-10 ENCOUNTER — Ambulatory Visit: Payer: Medicare PPO | Admitting: Family Medicine

## 2021-10-10 VITALS — BP 118/70 | HR 60 | Resp 20 | Ht 68.0 in | Wt 198.0 lb

## 2021-10-10 DIAGNOSIS — K635 Polyp of colon: Secondary | ICD-10-CM | POA: Diagnosis not present

## 2021-10-10 DIAGNOSIS — R059 Cough, unspecified: Secondary | ICD-10-CM

## 2021-10-10 DIAGNOSIS — E559 Vitamin D deficiency, unspecified: Secondary | ICD-10-CM | POA: Diagnosis not present

## 2021-10-10 DIAGNOSIS — E669 Obesity, unspecified: Secondary | ICD-10-CM | POA: Diagnosis not present

## 2021-10-10 DIAGNOSIS — I10 Essential (primary) hypertension: Secondary | ICD-10-CM | POA: Diagnosis not present

## 2021-10-10 DIAGNOSIS — M8580 Other specified disorders of bone density and structure, unspecified site: Secondary | ICD-10-CM | POA: Diagnosis not present

## 2021-10-10 DIAGNOSIS — E1169 Type 2 diabetes mellitus with other specified complication: Secondary | ICD-10-CM

## 2021-10-10 DIAGNOSIS — I2510 Atherosclerotic heart disease of native coronary artery without angina pectoris: Secondary | ICD-10-CM | POA: Diagnosis not present

## 2021-10-10 DIAGNOSIS — E782 Mixed hyperlipidemia: Secondary | ICD-10-CM

## 2021-10-10 DIAGNOSIS — R0602 Shortness of breath: Secondary | ICD-10-CM | POA: Diagnosis not present

## 2021-10-10 DIAGNOSIS — E1100 Type 2 diabetes mellitus with hyperosmolarity without nonketotic hyperglycemic-hyperosmolar coma (NKHHC): Secondary | ICD-10-CM | POA: Diagnosis not present

## 2021-10-10 LAB — COMPREHENSIVE METABOLIC PANEL
ALT: 12 U/L (ref 0–35)
AST: 13 U/L (ref 0–37)
Albumin: 4.2 g/dL (ref 3.5–5.2)
Alkaline Phosphatase: 58 U/L (ref 39–117)
BUN: 17 mg/dL (ref 6–23)
CO2: 31 mEq/L (ref 19–32)
Calcium: 9.6 mg/dL (ref 8.4–10.5)
Chloride: 102 mEq/L (ref 96–112)
Creatinine, Ser: 0.79 mg/dL (ref 0.40–1.20)
GFR: 73.85 mL/min (ref >60.00)
Glucose, Bld: 145 mg/dL — ABNORMAL HIGH (ref 70–99)
Potassium: 3.8 mEq/L (ref 3.5–5.1)
Sodium: 143 mEq/L (ref 135–145)
Total Bilirubin: 0.4 mg/dL (ref 0.2–1.2)
Total Protein: 7.2 g/dL (ref 6.0–8.3)

## 2021-10-10 LAB — HEMOGLOBIN A1C: Hgb A1c MFr Bld: 7.2 % — ABNORMAL HIGH (ref 4.6–6.5)

## 2021-10-10 LAB — CBC
HCT: 36.1 % (ref 36.0–46.0)
Hemoglobin: 11.4 g/dL — ABNORMAL LOW (ref 12.0–15.0)
MCHC: 31.6 g/dL (ref 30.0–36.0)
MCV: 75.3 fl — ABNORMAL LOW (ref 78.0–100.0)
Platelets: 257 10*3/uL (ref 150.0–400.0)
RBC: 4.79 Mil/uL (ref 3.87–5.11)
RDW: 14.2 % (ref 11.5–15.5)
WBC: 7.9 10*3/uL (ref 4.0–10.5)

## 2021-10-10 LAB — LIPID PANEL
Cholesterol: 192 mg/dL (ref 0–200)
HDL: 56.6 mg/dL (ref >39.00)
LDL Cholesterol: 111 mg/dL — ABNORMAL HIGH (ref 0–99)
NonHDL: 135.1
Total CHOL/HDL Ratio: 3
Triglycerides: 123 mg/dL (ref 0.0–149.0)
VLDL: 24.6 mg/dL (ref 0.0–40.0)

## 2021-10-10 LAB — TSH: TSH: 2.03 u[IU]/mL (ref 0.35–5.50)

## 2021-10-10 LAB — VITAMIN D 25 HYDROXY (VIT D DEFICIENCY, FRACTURES): VITD: 27.34 ng/mL — ABNORMAL LOW (ref 30.00–100.00)

## 2021-10-10 LAB — MICROALBUMIN / CREATININE URINE RATIO
Creatinine,U: 49.5 mg/dL
Microalb Creat Ratio: 1.5 mg/g (ref 0.0–30.0)
Microalb, Ur: 0.7 mg/dL (ref 0.0–1.9)

## 2021-10-10 MED ORDER — HYOSCYAMINE SULFATE 0.125 MG PO TBDP: 0.1250 mg | ORAL_TABLET | ORAL | 1 refills | Status: DC | PRN

## 2021-10-10 MED ORDER — BENZONATATE 100 MG PO CAPS: 100.0000 mg | ORAL_CAPSULE | Freq: Two times a day (BID) | ORAL | 0 refills | Status: DC | PRN

## 2021-10-10 MED ORDER — FAMOTIDINE 40 MG PO TABS: 40.0000 mg | ORAL_TABLET | Freq: Every day | ORAL | 3 refills | Status: DC

## 2021-10-10 NOTE — Progress Notes (Signed)
? ?Subjective:  ? ?By signing my name below, I, Zite Okoli, attest that this documentation has been prepared under the direction and in the presence of Mosie Lukes, MD. 10/10/2021  ? ? Patient ID: Morgan Medina, female    DOB: 14-Sep-1945, 76 y.o.   MRN: [id number] ? ?Chief Complaint  ?Patient presents with  ? Follow-up  ? Cough  ? GI Problem  ? ? ?HPI ?Patient is in today for an office visit and f/u ? ?She was diagnosed with a URI in March and after medication, she was feeling better. However, she still has a persistent dry cough with occasional sputum production consistent with loud and persistent talking. She is drinking a lot of water. Denies chest pain and shortness of breath.  ? ?She complains of stomach pain that has been present for a few months. She cannot decide what is causing it and episodes occur at random times followed with diarrhea. They last for a day maximum and don't occur every week. Describes the pain as "griping". Denies blood in stool and melena ? ?Past Medical History:  ?Diagnosis Date  ? Anemia   ? Cerebral aneurysm   ? h/o in 2006  ? Dacrocystitis 07/19/2015  ? right  ? Diabetes mellitus type 2 in obese ([place]) 11/05/2014  ? GERD (gastroesophageal reflux disease) 04/17/2016  ? Hyperlipidemia   ? Hypertension   ? Osteoarthritis of left hip 12/02/2015  ? Osteopenia 11/22/2015  ? Overweight   ? ? ?Past Surgical History:  ?Procedure Laterality Date  ? ABDOMINAL HYSTERECTOMY    ? CEREBRAL ANEURYSM REPAIR    ? ? ?Family History  ?Problem Relation Age of Onset  ? Cerebral aneurysm Father   ? Alzheimer's disease Mother   ? Cancer Maternal Aunt   ?     breast  ? Diabetes Maternal Grandmother   ? Stroke Maternal Grandfather   ? Alzheimer's disease Paternal Grandmother   ? Stroke Paternal Grandfather   ? ? ?Social History  ? ?Socioeconomic History  ? Marital status: Widowed  ?  Spouse name: Not on file  ? Number of children: Not on file  ? Years of education: Not on file  ? Highest education level: Not on  file  ?Occupational History  ? Occupation: retired  ?Tobacco Use  ? Smoking status: Never  ? Smokeless tobacco: Never  ?Vaping Use  ? Vaping Use: Never used  ?Substance and Sexual Activity  ? Alcohol use: No  ? Drug use: No  ? Sexual activity: Never  ?  Comment: lives with self, no dietary restrictions. works as a Theme park manager, retired from state employment  ?Other Topics Concern  ? Not on file  ?Social History Narrative  ? Not on file  ? ?Social Determinants of Health  ? ?Financial Resource Strain: Low Risk   ? Difficulty of Paying Living Expenses: Not hard at all  ?Food Insecurity: No Food Insecurity  ? Worried About Charity fundraiser in the Last Year: Never true  ? Ran Out of Food in the Last Year: Never true  ?Transportation Needs: No Transportation Needs  ? Lack of Transportation (Medical): No  ? Lack of Transportation (Non-Medical): No  ?Physical Activity: Inactive  ? Days of Exercise per Week: 0 days  ? Minutes of Exercise per Session: 0 min  ?Stress: No Stress Concern Present  ? Feeling of Stress : Not at all  ?Social Connections: Moderately Isolated  ? Frequency of Communication with Friends and Family: More than three times a  week  ? Frequency of Social Gatherings with Friends and Family: More than three times a week  ? Attends Religious Services: More than 4 times per year  ? Active Member of Clubs or Organizations: No  ? Attends Archivist Meetings: Never  ? Marital Status: Widowed  ?Intimate Partner Violence: Not At Risk  ? Fear of Current or Ex-Partner: No  ? Emotionally Abused: No  ? Physically Abused: No  ? Sexually Abused: No  ? ? ?Outpatient Medications Prior to Visit  ?Medication Sig Dispense Refill  ? amLODipine (NORVASC) 5 MG tablet Take 1 tablet (5 mg total) by mouth daily. 90 tablet 1  ? aspirin 81 MG tablet Takes every other day    ? Blood Glucose Monitoring Suppl ([place]) W/DEVICE KIT Use to check blood sugar. DX. E11.9 1 each 0  ? carvedilol (COREG) 25 MG tablet TAKE 1  TABLET (25 MG TOTAL) BY MOUTH 2 (TWO) TIMES DAILY WITH A MEAL. 180 tablet 1  ? Cyanocobalamin (VITAMIN B 12) 250 MCG LOZG Take by mouth.    ? ferrous sulfate 325 (65 FE) MG tablet TAKE 1 TABLET BY MOUTH EVERY DAY WITH BREAKFAST 90 tablet 1  ? furosemide (LASIX) 40 MG tablet Take 1 tablet (40 mg total) by mouth 2 (two) times daily. 40 mg twice daily every other day. On the odd days 40 mg once daily. 200 tablet 3  ? losartan (COZAAR) 100 MG tablet TAKE 1 TABLET BY MOUTH EVERY EVENING. 90 tablet 1  ? metFORMIN (GLUCOPHAGE-XR) 500 MG 24 hr tablet Take 1 tablet (500 mg total) by mouth daily. 90 tablet 1  ? Multiple Vitamin (MULTIVITAMIN) capsule Take 1 capsule by mouth daily.    ? ONE TOUCH LANCETS MISC Use as directed once daily to check blood sugar. DX: E11.9 100 each 0  ? potassium chloride SA (KLOR-CON M20) 20 MEQ tablet TAKE 1 TABLET BY MOUTH EVERY OTHER DAY 45 tablet 0  ? RESTASIS 0.05 % ophthalmic emulsion INSTILL 1 DROP INTO BOTH EYES TWICE A DAY  3  ? rosuvastatin (CRESTOR) 5 MG tablet Take 1 tablet (5 mg total) by mouth every other day. 16 tablet 3  ? azithromycin (ZITHROMAX) 250 MG tablet Take 1 tablet (250 mg total) by mouth daily. Take first 2 tablets together, then 1 every day until finished. 6 tablet 0  ? ?No facility-administered medications prior to visit.  ? ? ?No Known Allergies ? ?Review of Systems  ?Constitutional:  Positive for malaise/fatigue. Negative for fever.  ?HENT:  Negative for congestion.   ?Eyes:  Negative for redness.  ?Respiratory:  Positive for cough and sputum production. Negative for shortness of breath.   ?Cardiovascular:  Negative for chest pain, palpitations and leg swelling.  ?Gastrointestinal:  Positive for abdominal pain and diarrhea. Negative for blood in stool and nausea.  ?Genitourinary:  Negative for dysuria and frequency.  ?Musculoskeletal:  Negative for falls.  ?Skin:  Negative for rash.  ?Neurological:  Negative for dizziness, loss of consciousness and headaches.   ?Endo/Heme/Allergies:  Negative for polydipsia.  ?Psychiatric/Behavioral:  Negative for depression. The patient is not nervous/anxious.   ? ?   ?Objective:  ?  ?Physical Exam ?Constitutional:   ?   General: She is not in acute distress. ?   Appearance: She is well-developed.  ?HENT:  ?   Head: Normocephalic and atraumatic.  ?   Mouth/Throat:  ?   Pharynx: Posterior oropharyngeal erythema present.  ?Eyes:  ?   Conjunctiva/sclera:  Conjunctivae normal.  ?Neck:  ?   Thyroid: No thyromegaly.  ?Cardiovascular:  ?   Rate and Rhythm: Normal rate and regular rhythm.  ?   Heart sounds: Normal heart sounds. No murmur heard. ?Pulmonary:  ?   Effort: Pulmonary effort is normal. No respiratory distress.  ?   Breath sounds: Normal breath sounds.  ?Abdominal:  ?   General: Bowel sounds are normal. There is no distension.  ?   Palpations: Abdomen is soft. There is no mass.  ?   Tenderness: There is no abdominal tenderness.  ?Musculoskeletal:  ?   Cervical back: Neck supple.  ?Lymphadenopathy:  ?   Cervical: No cervical adenopathy.  ?Skin: ?   General: Skin is warm and dry.  ?Neurological:  ?   Mental Status: She is alert and oriented to person, place, and time.  ?Psychiatric:     ?   Behavior: Behavior normal.  ? ? ?BP 118/70 (BP Location: Right Arm, Patient Position: Sitting, Cuff Size: Normal)   Pulse 60   Resp 20   Ht _0  (1.727 m)   Wt 198 lb (89.8 kg)   SpO2 99%   BMI 30.11 kg/m?  ?Wt Readings from Last 3 Encounters:  ?10/10/21 198 lb (89.8 kg)  ?03/14/21 201 lb 3.2 oz (91.3 kg)  ?11/29/20 197 lb (89.4 kg)  ? ? ?Diabetic Foot Exam - Simple   ?No data filed ?  ? ?Lab Results  ?Component Value Date  ? WBC 7.9 10/10/2021  ? HGB 11.4 (L) 10/10/2021  ? HCT 36.1 10/10/2021  ? PLT 257.0 10/10/2021  ? GLUCOSE 145 (H) 10/10/2021  ? CHOL 192 10/10/2021  ? TRIG 123.0 10/10/2021  ? HDL 56.60 10/10/2021  ? LDLDIRECT 181.0 01/03/2020  ? LDLCALC 111 (H) 10/10/2021  ? ALT 12 10/10/2021  ? AST 13 10/10/2021  ? NA 143 10/10/2021  ? K  3.8 10/10/2021  ? CL 102 10/10/2021  ? CREATININE 0.79 10/10/2021  ? BUN 17 10/10/2021  ? CO2 31 10/10/2021  ? TSH 2.03 10/10/2021  ? HGBA1C 7.2 (H) 10/10/2021  ? MICROALBUR 0.7 10/10/2021  ? ? ?Lab Results  ?[place]

## 2021-10-10 NOTE — Patient Instructions (Signed)

## 2021-10-13 DIAGNOSIS — R059 Cough, unspecified: Secondary | ICD-10-CM | POA: Insufficient documentation

## 2021-10-13 NOTE — Assessment & Plan Note (Signed)
Encouraged to get adequate exercise, calcium and vitamin d intake 

## 2021-10-13 NOTE — Assessment & Plan Note (Signed)
Well controlled, no changes to meds. Encouraged heart healthy diet such as the DASH diet and exercise as tolerated.  °

## 2021-10-13 NOTE — Assessment & Plan Note (Signed)
Has intermittent abdominal pains, had colonoscopy last year. No bloody or tarry stool. Given Hyoscyamine to use prn. Started on Pepcid.  ?

## 2021-10-13 NOTE — Assessment & Plan Note (Addendum)
Check CXR, consider Pepcid and Zyrtec. If persists she will let us know so we can consider ENT referral. Given Tessalon Perles.  ?

## 2021-10-13 NOTE — Assessment & Plan Note (Signed)
Tolerating statin, encouraged heart healthy diet, avoid trans fats, minimize simple carbs and saturated fats. Increase exercise as tolerated 

## 2021-10-13 NOTE — Assessment & Plan Note (Signed)
Supplement and monitor 

## 2021-10-13 NOTE — Assessment & Plan Note (Signed)
hgba1c acceptable, minimize simple carbs. Increase exercise as tolerated. Continue current meds 

## 2021-11-04 ENCOUNTER — Telehealth: Payer: Self-pay | Admitting: Family Medicine

## 2021-11-04 NOTE — Telephone Encounter (Signed)
Medication:   losartan (COZAAR) 100 MG tablet [[id number]]   metFORMIN (GLUCOPHAGE-XR) 500 MG 24 hr tablet [[id number]]   Has the patient contacted their pharmacy? No. (If no, request that the patient contact the pharmacy for the refill.) (If yes, when and what did the pharmacy advise?)  Preferred Pharmacy (with phone number or street name):   [id number] [place] [address] [place] [address] Phone:  [phone] Fax:  [phone]  Agent: Please be advised that RX refills may take up to 3 business days. We ask that you follow-up with your pharmacy.

## 2021-11-05 MED ORDER — LOSARTAN POTASSIUM 100 MG PO TABS: 100.0000 mg | ORAL_TABLET | Freq: Every evening | ORAL | 1 refills | Status: DC

## 2021-11-05 MED ORDER — METFORMIN HCL ER 500 MG PO TB24: 500.0000 mg | ORAL_TABLET | Freq: Every day | ORAL | 1 refills | Status: DC

## 2021-11-05 NOTE — Telephone Encounter (Signed)
Refill sent.

## 2021-11-19 ENCOUNTER — Ambulatory Visit: Payer: Medicare PPO | Admitting: Family Medicine

## 2021-11-21 ENCOUNTER — Ambulatory Visit: Payer: Medicare PPO

## 2021-12-05 ENCOUNTER — Other Ambulatory Visit: Payer: Self-pay | Admitting: Cardiology

## 2021-12-05 ENCOUNTER — Other Ambulatory Visit: Payer: Self-pay

## 2021-12-05 MED ORDER — FUROSEMIDE 40 MG PO TABS: 40.0000 mg | ORAL_TABLET | Freq: Two times a day (BID) | ORAL | 3 refills | Status: DC

## 2021-12-06 ENCOUNTER — Other Ambulatory Visit: Payer: Self-pay | Admitting: Family Medicine

## 2022-01-04 ENCOUNTER — Other Ambulatory Visit: Payer: Self-pay | Admitting: Cardiology

## 2022-01-05 ENCOUNTER — Other Ambulatory Visit: Payer: Self-pay | Admitting: Family Medicine

## 2022-01-06 ENCOUNTER — Ambulatory Visit (INDEPENDENT_AMBULATORY_CARE_PROVIDER_SITE_OTHER): Payer: Medicare PPO

## 2022-01-06 VITALS — Ht 68.0 in | Wt 198.0 lb

## 2022-01-06 DIAGNOSIS — Z780 Asymptomatic menopausal state: Secondary | ICD-10-CM

## 2022-01-06 DIAGNOSIS — Z1231 Encounter for screening mammogram for malignant neoplasm of breast: Secondary | ICD-10-CM | POA: Diagnosis not present

## 2022-01-06 DIAGNOSIS — Z0000 Encounter for general adult medical examination without abnormal findings: Secondary | ICD-10-CM | POA: Diagnosis not present

## 2022-01-06 NOTE — Patient Instructions (Signed)
Morgan Medina , Thank you for taking time to complete your Medicare Wellness Visit. I appreciate your ongoing commitment to your health goals. Please review the following plan we discussed and let me know if I can assist you in the future.   Screening recommendations/referrals: Colonoscopy: Completed 03/27/2021 Mammogram: Ordered today. Someone will call you to schedule. Bone Density: Ordered today. Someone will call you to schedule. Recommended yearly ophthalmology/optometry visit for glaucoma screening and checkup Recommended yearly dental visit for hygiene and checkup  Vaccinations: Influenza vaccine: Up to date Pneumococcal vaccine: Due-May obtain vaccine at our office or your local pharmacy. Tdap vaccine: Due-May obtain vaccine at your local pharmacy. Shingles vaccine: Due-May obtain vaccine at your local pharmacy. Covid-19:Booster available at your local pharmacy  Advanced directives: May pick up information at our office.  Conditions/risks identified: See problem list  Next appointment: Follow up in one year for your annual wellness visit    Preventive Care 74 Years and Older, Female Preventive care refers to lifestyle choices and visits with your health care provider that can promote health and wellness. What does preventive care include? A yearly physical exam. This is also called an annual well check. Dental exams once or twice a year. Routine eye exams. Ask your health care provider how often you should have your eyes checked. Personal lifestyle choices, including: Daily care of your teeth and gums. Regular physical activity. Eating a healthy diet. Avoiding tobacco and drug use. Limiting alcohol use. Practicing safe sex. Taking low-dose aspirin every day. Taking vitamin and mineral supplements as recommended by your health care provider. What happens during an annual well check? The services and screenings done by your health care provider during your annual well check  will depend on your age, overall health, lifestyle risk factors, and family history of disease. Counseling  Your health care provider may ask you questions about your: Alcohol use. Tobacco use. Drug use. Emotional well-being. Home and relationship well-being. Sexual activity. Eating habits. History of falls. Memory and ability to understand (cognition). Work and work Statistician. Reproductive health. Screening  You may have the following tests or measurements: Height, weight, and BMI. Blood pressure. Lipid and cholesterol levels. These may be checked every 5 years, or more frequently if you are over 21 years old. Skin check. Lung cancer screening. You may have this screening every year starting at age 49 if you have a 30-pack-year history of smoking and currently smoke or have quit within the past 15 years. Fecal occult blood test (FOBT) of the stool. You may have this test every year starting at age 15. Flexible sigmoidoscopy or colonoscopy. You may have a sigmoidoscopy every 5 years or a colonoscopy every 10 years starting at age 43. Hepatitis C blood test. Hepatitis B blood test. Sexually transmitted disease (STD) testing. Diabetes screening. This is done by checking your blood sugar (glucose) after you have not eaten for a while (fasting). You may have this done every 1-3 years. Bone density scan. This is done to screen for osteoporosis. You may have this done starting at age 20. Mammogram. This may be done every 1-2 years. Talk to your health care provider about how often you should have regular mammograms. Talk with your health care provider about your test results, treatment options, and if necessary, the need for more tests. Vaccines  Your health care provider may recommend certain vaccines, such as: Influenza vaccine. This is recommended every year. Tetanus, diphtheria, and acellular pertussis (Tdap, Td) vaccine. You may need a Td  booster every 10 years. Zoster vaccine. You  may need this after age 37. Pneumococcal 13-valent conjugate (PCV13) vaccine. One dose is recommended after age 52. Pneumococcal polysaccharide (PPSV23) vaccine. One dose is recommended after age 48. Talk to your health care provider about which screenings and vaccines you need and how often you need them. This information is not intended to replace advice given to you by your health care provider. Make sure you discuss any questions you have with your health care provider. Document Released: 06/29/2015 Document Revised: 02/20/2016 Document Reviewed: 04/03/2015 Elsevier Interactive Patient Education  2017 [place] Prevention in the Home Falls can cause injuries. They can happen to people of all ages. There are many things you can do to make your home safe and to help prevent falls. What can I do on the outside of my home? Regularly fix the edges of walkways and driveways and fix any cracks. Remove anything that might make you trip as you walk through a door, such as a raised step or threshold. Trim any bushes or trees on the path to your home. Use bright outdoor lighting. Clear any walking paths of anything that might make someone trip, such as rocks or tools. Regularly check to see if handrails are loose or broken. Make sure that both sides of any steps have handrails. Any raised decks and porches should have guardrails on the edges. Have any leaves, snow, or ice cleared regularly. Use sand or salt on walking paths during winter. Clean up any spills in your garage right away. This includes oil or grease spills. What can I do in the bathroom? Use night lights. Install grab bars by the toilet and in the tub and shower. Do not use towel bars as grab bars. Use non-skid mats or decals in the tub or shower. If you need to sit down in the shower, use a plastic, non-slip stool. Keep the floor dry. Clean up any water that spills on the floor as soon as it happens. Remove soap buildup  in the tub or shower regularly. Attach bath mats securely with double-sided non-slip rug tape. Do not have throw rugs and other things on the floor that can make you trip. What can I do in the bedroom? Use night lights. Make sure that you have a light by your bed that is easy to reach. Do not use any sheets or blankets that are too big for your bed. They should not hang down onto the floor. Have a firm chair that has side arms. You can use this for support while you get dressed. Do not have throw rugs and other things on the floor that can make you trip. What can I do in the kitchen? Clean up any spills right away. Avoid walking on wet floors. Keep items that you use a lot in easy-to-reach places. If you need to reach something above you, use a strong step stool that has a grab bar. Keep electrical cords out of the way. Do not use floor polish or wax that makes floors slippery. If you must use wax, use non-skid floor wax. Do not have throw rugs and other things on the floor that can make you trip. What can I do with my stairs? Do not leave any items on the stairs. Make sure that there are handrails on both sides of the stairs and use them. Fix handrails that are broken or loose. Make sure that handrails are as long as the stairways. Check any carpeting  to make sure that it is firmly attached to the stairs. Fix any carpet that is loose or worn. Avoid having throw rugs at the top or bottom of the stairs. If you do have throw rugs, attach them to the floor with carpet tape. Make sure that you have a light switch at the top of the stairs and the bottom of the stairs. If you do not have them, ask someone to add them for you. What else can I do to help prevent falls? Wear shoes that: Do not have high heels. Have rubber bottoms. Are comfortable and fit you well. Are closed at the toe. Do not wear sandals. If you use a stepladder: Make sure that it is fully opened. Do not climb a closed  stepladder. Make sure that both sides of the stepladder are locked into place. Ask someone to hold it for you, if possible. Clearly mark and make sure that you can see: Any grab bars or handrails. First and last steps. Where the edge of each step is. Use tools that help you move around (mobility aids) if they are needed. These include: Canes. Walkers. Scooters. Crutches. Turn on the lights when you go into a dark area. Replace any light bulbs as soon as they burn out. Set up your furniture so you have a clear path. Avoid moving your furniture around. If any of your floors are uneven, fix them. If there are any pets around you, be aware of where they are. Review your medicines with your doctor. Some medicines can make you feel dizzy. This can increase your chance of falling. Ask your doctor what other things that you can do to help prevent falls. This information is not intended to replace advice given to you by your health care provider. Make sure you discuss any questions you have with your health care provider. Document Released: 03/29/2009 Document Revised: 11/08/2015 Document Reviewed: 07/07/2014 Elsevier Interactive Patient Education  2017 [employer].

## 2022-01-06 NOTE — Progress Notes (Signed)
Subjective:   Morgan Medina is a 76 y.o. female who presents for Medicare Annual (Subsequent) preventive examination.  I connected with Darcee today by telephone and verified that I am speaking with the correct person using two identifiers. Location patient: home Location provider: work Persons participating in the virtual visit: patient, Marine scientist.    I discussed the limitations, risks, security and privacy concerns of performing an evaluation and management service by telephone and the availability of in person appointments. I also discussed with the patient that there may be a patient responsible charge related to this service. The patient expressed understanding and verbally consented to this telephonic visit.    Interactive audio and video telecommunications were attempted between this provider and patient, however failed, due to patient having technical difficulties OR patient did not have access to video capability.  We continued and completed visit with audio only.  Some vital signs may be absent or patient reported.   Time Spent with patient on telephone encounter: 20 minutes   Review of Systems     Cardiac Risk Factors include: advanced age (>45mn, >>55women);hypertension;diabetes mellitus;dyslipidemia;obesity (BMI >30kg/m2)     Objective:    Today's Vitals   01/06/22 1520  Weight: 198 lb (89.8 kg)  Height: '5\' 8"'  (1.727 m)   Body mass index is 30.11 kg/m.     01/06/2022    3:23 PM 09/10/2021    9:28 PM 11/19/2020    3:54 PM 11/08/2019    9:40 AM 10/26/2018    9:09 AM 12/04/2017    5:47 PM 10/22/2017    9:28 AM  Advanced Directives  Does Patient Have a Medical Advance Directive? No No No No No No No  Does patient want to make changes to medical advance directive?     No - Patient declined    Would patient like information on creating a medical advance directive?   Yes (MAU/Ambulatory/Procedural Areas - Information given) No - Patient declined   Yes  (MAU/Ambulatory/Procedural Areas - Information given)    Current Medications (verified) Outpatient Encounter Medications as of 01/06/2022  Medication Sig   amLODipine (NORVASC) 5 MG tablet TAKE 1 TABLET (5 MG TOTAL) BY MOUTH DAILY.   aspirin 81 MG tablet Takes every other day   benzonatate (TESSALON) 100 MG capsule Take 1 capsule (100 mg total) by mouth 2 (two) times daily as needed for cough.   Blood Glucose Monitoring Suppl ([place] W/DEVICE KIT Use to check blood sugar. DX. E11.9   carvedilol (COREG) 25 MG tablet TAKE 1 TABLET (25 MG TOTAL) BY MOUTH 2 (TWO) TIMES DAILY WITH A MEAL.   Cyanocobalamin (VITAMIN B 12) 250 MCG LOZG Take by mouth.   famotidine (PEPCID) 40 MG tablet TAKE 1 TABLET BY MOUTH EVERY DAY   ferrous sulfate 325 (65 FE) MG tablet TAKE 1 TABLET BY MOUTH EVERY DAY WITH BREAKFAST   furosemide (LASIX) 40 MG tablet Take 1 tablet (40 mg total) by mouth 2 (two) times daily. 40 mg twice daily every other day. On the odd days 40 mg once daily.   hyoscyamine (ANASPAZ) 0.125 MG TBDP disintergrating tablet Place 1 tablet (0.125 mg total) under the tongue every 4 (four) hours as needed.   losartan (COZAAR) 100 MG tablet Take 1 tablet (100 mg total) by mouth every evening.   metFORMIN (GLUCOPHAGE-XR) 500 MG 24 hr tablet Take 1 tablet (500 mg total) by mouth daily.   Multiple Vitamin (MULTIVITAMIN) capsule Take 1 capsule by mouth daily.  ONE TOUCH LANCETS MISC Use as directed once daily to check blood sugar. DX: E11.9   potassium chloride SA (KLOR-CON M20) 20 MEQ tablet TAKE 1 TABLET BY MOUTH EVERY OTHER DAY   RESTASIS 0.05 % ophthalmic emulsion INSTILL 1 DROP INTO BOTH EYES TWICE A DAY   rosuvastatin (CRESTOR) 5 MG tablet Take 1 tablet (5 mg total) by mouth every other day.   No facility-administered encounter medications on file as of 01/06/2022.    Allergies (verified) Patient has no known allergies.   History: Past Medical History:  Diagnosis Date   Anemia     Cerebral aneurysm    h/o in 2006   Dacrocystitis 07/19/2015   right   Diabetes mellitus type 2 in obese ([place]) 11/05/2014   GERD (gastroesophageal reflux disease) 04/17/2016   Hyperlipidemia    Hypertension    Osteoarthritis of left hip 12/02/2015   Osteopenia 11/22/2015   Overweight    Past Surgical History:  Procedure Laterality Date   ABDOMINAL HYSTERECTOMY     CEREBRAL ANEURYSM REPAIR     Family History  Problem Relation Age of Onset   Cerebral aneurysm Father    Alzheimer's disease Mother    Cancer Maternal Aunt        breast   Diabetes Maternal Grandmother    Stroke Maternal Grandfather    Alzheimer's disease Paternal Grandmother    Stroke Paternal Grandfather    Social History   Socioeconomic History   Marital status: Widowed    Spouse name: Not on file   Number of children: Not on file   Years of education: Not on file   Highest education level: Not on file  Occupational History   Occupation: retired  Tobacco Use   Smoking status: Never   Smokeless tobacco: Never  Vaping Use   Vaping Use: Never used  Substance and Sexual Activity   Alcohol use: No   Drug use: No   Sexual activity: Never    Comment: lives with self, no dietary restrictions. works as a Theme park manager, retired from state employment  Other Topics Concern   Not on file  Social History Narrative   Not on file   Social Determinants of [place] Strain: [place]  (01/06/2022)   Overall Financial Resource Strain (CARDIA)    Difficulty of Paying Living Expenses: Not hard at all  Food Insecurity: No Food Insecurity (01/06/2022)   Hunger Vital Sign    Worried About Running Out of Food in the Last Year: Never true    [place] in the Last Year: Never true  Transportation Needs: No Transportation Needs (11/19/2020)   PRAPARE - Hydrologist (Medical): No    Lack of Transportation (Non-Medical): No  Physical Activity: Inactive (01/06/2022)   Exercise Vital Sign     Days of Exercise per Week: 0 days    Minutes of Exercise per Session: 0 min  Stress: No Stress Concern Present (01/06/2022)   [place]    Feeling of Stress : Not at all  Social Connections: Moderately Isolated (11/19/2020)   Social Connection and Isolation Panel [NHANES]    Frequency of Communication with Friends and Family: More than three times a week    Frequency of Social Gatherings with Friends and Family: More than three times a week    Attends Religious Services: More than 4 times per year    Active Member of [employer] or Organizations:  No    Attends Archivist Meetings: Never    Marital Status: Widowed    Tobacco Counseling Counseling given: Not Answered   Clinical Intake:  Pre-visit preparation completed: Yes  Pain : No/denies pain     BMI - recorded: 30.11 Nutritional Status: BMI > 30  Obese Nutritional Risks: None Diabetes: Yes CBG done?: No Did pt. bring in CBG monitor from home?: No  How often do you need to have someone help you when you read instructions, pamphlets, or other written materials from your doctor or pharmacy?: 1 - Never  Diabetes:  Is the patient diabetic?  Yes  If diabetic, was a CBG obtained today?  No  Did the patient bring in their glucometer from home?  No phone visit How often do you monitor your CBG's? occasionally.   Financial Strains and Diabetes Management:  Are you having any financial strains with the device, your supplies or your medication? No .  Does the patient want to be seen by Chronic Care Management for management of their diabetes?  No  Would the patient like to be referred to a Nutritionist or for Diabetic Management?  No   Diabetic Exams:  Diabetic Eye Exam: Completed 04/09/2021.   Diabetic Foot Exam: Completed 03/14/2021.    Interpreter Needed?: No  Information entered by :: Caroleen Hamman LPN   Activities of Daily Living     01/06/2022    3:26 PM  In your present state of health, do you have any difficulty performing the following activities:  Hearing? 0  Vision? 0  Difficulty concentrating or making decisions? 0  Walking or climbing stairs? 0  Dressing or bathing? 0  Doing errands, shopping? 0  Preparing Food and eating ? N  Using the Toilet? N  In the past six months, have you accidently leaked urine? N  Do you have problems with loss of bowel control? N  Managing your Medications? N  Managing your Finances? N  Housekeeping or managing your Housekeeping? N    Patient Care Team: Mosie Lukes, MD as PCP - General (Family Medicine) Berniece Salines, DO as PCP - Cardiology (Cardiology) Rolley Sims, [place] as Consulting Physician [facility]) Center, [place] any recent Medical Services you may have received from other than [facility] providers in the past year (date may be approximate).     Assessment:   This is a routine wellness examination for [place].  Hearing/Vision screen Hearing Screening - Comments:: No issues Vision Screening - Comments:: Last eye exam-06/2021-Dr. Atkins  Dietary issues and exercise activities discussed: Current Exercise Habits: The patient does not participate in regular exercise at present, Exercise limited by: None identified   Goals Addressed             This Visit's Progress    Eat a healther diet   On track    Increase physical activity   Not on track    5 min rule 2x/week       Depression Screen    01/06/2022    3:25 PM 10/10/2021    9:32 AM 03/14/2021    2:08 PM 11/29/2020    3:18 PM 11/19/2020    3:56 PM 01/03/2020   10:42 AM 11/08/2019    9:42 AM  PHQ 2/9 Scores  PHQ - 2 Score 0 0 0 0 0 0 0  PHQ- 9 Score   4        Fall Risk    01/06/2022    3:24 PM 10/10/2021  9:32 AM 11/29/2020    3:18 PM 11/19/2020    3:56 PM 01/03/2020   10:42 AM  Fall Risk   Falls in the past year? 0 0 0 0 0  Number falls in past yr: 0 0 0 0   Injury with Fall? 0  0 0 0   Risk for fall due to :  No Fall Risks     Follow up Falls prevention discussed Falls evaluation completed  Falls prevention discussed     FALL RISK PREVENTION PERTAINING TO THE HOME:  Any stairs in or around the home? Yes  If so, are there any without handrails? Yes  Home free of loose throw rugs in walkways, pet beds, electrical cords, etc? Yes  Adequate lighting in your home to reduce risk of falls? Yes   ASSISTIVE DEVICES UTILIZED TO PREVENT FALLS:  Life alert? No  Use of a cane, walker or w/c? No  Grab bars in the bathroom? No  Shower chair or bench in shower? Yes  Elevated toilet seat or a handicapped toilet? No   TIMED UP AND GO:  Was the test performed? No . Phone visit   Cognitive Function:Normal cognitive status assessed by this Nurse Health Advisor. No abnormalities found.      04/17/2016    3:02 PM  MMSE - Mini Mental State Exam  Orientation to time 5  Orientation to Place 5  Registration 3  Attention/ Calculation 5  Recall 3  Language- name 2 objects 2  Language- repeat 1  Language- follow 3 step command 3  Language- read & follow direction 1  Write a sentence 1  Copy design 1  Total score 30        Immunizations Immunization History  Administered Date(s) Administered   Fluad Quad(high Dose 65+) 03/14/2021   Influenza, High Dose Seasonal PF 04/17/2016, 06/04/2017, 07/09/2018, 03/06/2020   Influenza,inj,Quad PF,6+ Mos 04/11/2014, 05/23/2015   PFIZER Comirnaty(Gray Top)Covid-19 Tri-Sucrose Vaccine 02/01/2021   PFIZER(Purple Top)SARS-COV-2 Vaccination 08/09/2019, 08/30/2019, 05/16/2020    TDAP status: Due, Education has been provided regarding the importance of this vaccine. Advised may receive this vaccine at local pharmacy or Health Dept. Aware to provide a copy of the vaccination record if obtained from local pharmacy or Health Dept. Verbalized acceptance and understanding.  Flu Vaccine status: Up to date  Pneumococcal vaccine status:  Due, Education has been provided regarding the importance of this vaccine. Advised may receive this vaccine at local pharmacy or Health Dept. Aware to provide a copy of the vaccination record if obtained from local pharmacy or Health Dept. Verbalized acceptance and understanding.  Covid-19 vaccine status: Information provided on how to obtain vaccines.   Qualifies for Shingles Vaccine? Yes   Zostavax completed No   Shingrix Completed?: No.    Education has been provided regarding the importance of this vaccine. Patient has been advised to call insurance company to determine out of pocket expense if they have not yet received this vaccine. Advised may also receive vaccine at local pharmacy or Health Dept. Verbalized acceptance and understanding.  Screening Tests Health Maintenance  Topic Date Due   Zoster Vaccines- Shingrix (1 of 2) Never done   Pneumonia Vaccine 73+ Years old (1 - PCV) Never done   TETANUS/TDAP  06/15/2020   MAMMOGRAM  01/11/2021   COVID-19 Vaccine (5 - Pfizer series) 03/29/2021   INFLUENZA VACCINE  01/14/2022   FOOT EXAM  03/14/2022   OPHTHALMOLOGY EXAM  04/09/2022   HEMOGLOBIN A1C  04/11/2022  COLONOSCOPY (Pts 45-11yr Insurance coverage will need to be confirmed)  03/28/2031   DEXA SCAN  Completed   Hepatitis C Screening  Completed   HPV VACCINES  Aged Out    Health Maintenance  Health Maintenance Due  Topic Date Due   Zoster Vaccines- Shingrix (1 of 2) Never done   Pneumonia Vaccine 74 Years old (1 - PCV) Never done   TETANUS/TDAP  06/15/2020   MAMMOGRAM  01/11/2021   COVID-19 Vaccine (5 - Pfizer series) 03/29/2021    Colorectal cancer screening: Type of screening: Colonoscopy. Completed 03/27/2021. Repeat every 10 years  Mammogram status: Ordered today. Pt provided with contact info and advised to call to schedule appt.   Bone Density status: Ordered today. Pt provided with contact info and advised to call to schedule appt.  Lung Cancer Screening:  (Low Dose CT Chest recommended if Age 816-80years, 30 pack-year currently smoking OR have quit w/in 15years.) does not qualify.     Additional Screening:  Hepatitis C Screening: Completed 02/02/2018  Vision Screening: Recommended annual ophthalmology exams for early detection of glaucoma and other disorders of the eye. Is the patient up to date with their annual eye exam?  Yes  Who is the provider or what is the name of the office in which the patient attends annual eye exams? Dr AOrvan Seen  Dental Screening: Recommended annual dental exams for proper oral hygiene  Community Resource Referral / Chronic Care Management: CRR required this visit?  No   CCM required this visit?  No      Plan:     I have personally reviewed and noted the following in the patient's chart:   Medical and social history Use of alcohol, tobacco or illicit drugs  Current medications and supplements including opioid prescriptions.  Functional ability and status Nutritional status Physical activity Advanced directives List of other physicians Hospitalizations, surgeries, and ER visits in previous 12 months Vitals Screenings to include cognitive, depression, and falls Referrals and appointments  In addition, I have reviewed and discussed with patient certain preventive protocols, quality metrics, and best practice recommendations. A written personalized care plan for preventive services as well as general preventive health recommendations were provided to patient.   Due to this being a telephonic visit, the after visit summary with patients personalized plan was offered to patient via mail or my-chart. Patient would like to access on my-chart.   MMarta Antu LPN   [id number] Nurse Health Advisor  Nurse Notes: None

## 2022-01-07 ENCOUNTER — Telehealth (HOSPITAL_BASED_OUTPATIENT_CLINIC_OR_DEPARTMENT_OTHER): Payer: Self-pay

## 2022-01-20 ENCOUNTER — Other Ambulatory Visit: Payer: Self-pay | Admitting: Cardiology

## 2022-01-27 ENCOUNTER — Telehealth (HOSPITAL_BASED_OUTPATIENT_CLINIC_OR_DEPARTMENT_OTHER): Payer: Self-pay

## 2022-02-19 ENCOUNTER — Other Ambulatory Visit: Payer: Self-pay | Admitting: Family Medicine

## 2022-03-25 ENCOUNTER — Telehealth: Payer: Self-pay | Admitting: Pharmacist

## 2022-03-25 MED ORDER — ROSUVASTATIN CALCIUM 5 MG PO TABS: 5.0000 mg | ORAL_TABLET | ORAL | 3 refills | Status: DC

## 2022-03-25 NOTE — Telephone Encounter (Signed)
Called [facility] - patient has not filled rosuvastatin '5mg'$  since 02/22/2021.  Recent LDL on 10/10/2021 was 111 Past LDL was 117 on 05/07/2021 and 95 on 03/12/2021 and as high as 183 on 08/20/2020.   Patient noted to have CAD and type 2 DM. LDL goal < 55 - recommended to be on moderate to high intensity statin.  Called patient. She states she stopped rosuvastatin just because she felt that she was taking a lot of medications. Discussed benefits of statin therapy for patient with CAD and diabetes. She agrees to retry rosuvastatin '5mg'$  QOD (patient would only agree to retrying same as previously tried) Prescription send to pharmacy.

## 2022-03-25 NOTE — Telephone Encounter (Signed)
-----   Message from Mosie Lukes, MD sent at 03/20/2022  8:59 PM EDT ----- Regarding: RE: statin Yes that would be great. Let's see what she sys. Thanks ----- Message ----- From: Cherre Robins, RPH-CPP Sent: 03/20/2022   3:23 PM EDT To: Mosie Lukes, MD Subject: statin                                         Patient is on statin in cardiovascular disease gap reports. You have prescribed rosuvastatin '5mg'$  but she has not filled in 2023 yet.  Are you OK if I reach out to her to see if she has just forgotten to refill or if she had side effects?  Shamecka Hocutt

## 2022-04-17 DIAGNOSIS — E119 Type 2 diabetes mellitus without complications: Secondary | ICD-10-CM | POA: Diagnosis not present

## 2022-04-17 DIAGNOSIS — H04123 Dry eye syndrome of bilateral lacrimal glands: Secondary | ICD-10-CM | POA: Diagnosis not present

## 2022-04-17 DIAGNOSIS — Z961 Presence of intraocular lens: Secondary | ICD-10-CM | POA: Diagnosis not present

## 2022-04-17 DIAGNOSIS — H52223 Regular astigmatism, bilateral: Secondary | ICD-10-CM | POA: Diagnosis not present

## 2022-04-17 DIAGNOSIS — H5213 Myopia, bilateral: Secondary | ICD-10-CM | POA: Diagnosis not present

## 2022-04-17 LAB — HM DIABETES EYE EXAM

## 2022-04-17 NOTE — Telephone Encounter (Signed)
[facility]ed [facility] to see if [facility] Rx was pick up. Rosuvastatin was NOT pick up - rx sent in 03/25/2022. After 14 days was returned to stock.  Patient has documented CAD and LDL > 100. Statin is recommended at moderate to high dose if tolerated / patient will agree.  She sees PCP 05/06/2022 - appointment message placed for PCP to discuss statin therapy with patient.

## 2022-05-06 ENCOUNTER — Ambulatory Visit (INDEPENDENT_AMBULATORY_CARE_PROVIDER_SITE_OTHER): Payer: Medicare PPO | Admitting: Family Medicine

## 2022-05-06 ENCOUNTER — Other Ambulatory Visit (HOSPITAL_BASED_OUTPATIENT_CLINIC_OR_DEPARTMENT_OTHER): Payer: Self-pay

## 2022-05-06 ENCOUNTER — Encounter: Payer: Self-pay | Admitting: Family Medicine

## 2022-05-06 VITALS — BP 128/74 | HR 65 | Temp 98.0°F | Resp 16 | Ht 68.0 in | Wt 199.2 lb

## 2022-05-06 DIAGNOSIS — E782 Mixed hyperlipidemia: Secondary | ICD-10-CM | POA: Diagnosis not present

## 2022-05-06 DIAGNOSIS — R7989 Other specified abnormal findings of blood chemistry: Secondary | ICD-10-CM | POA: Diagnosis not present

## 2022-05-06 DIAGNOSIS — I10 Essential (primary) hypertension: Secondary | ICD-10-CM | POA: Diagnosis not present

## 2022-05-06 DIAGNOSIS — M25512 Pain in left shoulder: Secondary | ICD-10-CM

## 2022-05-06 DIAGNOSIS — Z0000 Encounter for general adult medical examination without abnormal findings: Secondary | ICD-10-CM

## 2022-05-06 DIAGNOSIS — Z23 Encounter for immunization: Secondary | ICD-10-CM | POA: Diagnosis not present

## 2022-05-06 DIAGNOSIS — E1100 Type 2 diabetes mellitus with hyperosmolarity without nonketotic hyperglycemic-hyperosmolar coma (NKHHC): Secondary | ICD-10-CM

## 2022-05-06 DIAGNOSIS — Z1239 Encounter for other screening for malignant neoplasm of breast: Secondary | ICD-10-CM

## 2022-05-06 DIAGNOSIS — E559 Vitamin D deficiency, unspecified: Secondary | ICD-10-CM

## 2022-05-06 DIAGNOSIS — D508 Other iron deficiency anemias: Secondary | ICD-10-CM

## 2022-05-06 LAB — COMPREHENSIVE METABOLIC PANEL
ALT: 11 U/L (ref 0–35)
AST: 13 U/L (ref 0–37)
Albumin: 4.5 g/dL (ref 3.5–5.2)
Alkaline Phosphatase: 58 U/L (ref 39–117)
BUN: 16 mg/dL (ref 6–23)
CO2: 30 mEq/L (ref 19–32)
Calcium: 9.5 mg/dL (ref 8.4–10.5)
Chloride: 103 mEq/L (ref 96–112)
Creatinine, Ser: 0.76 mg/dL (ref 0.40–1.20)
GFR: 77.06 mL/min (ref >60.00)
Glucose, Bld: 125 mg/dL — ABNORMAL HIGH (ref 70–99)
Potassium: 3.8 mEq/L (ref 3.5–5.1)
Sodium: 140 mEq/L (ref 135–145)
Total Bilirubin: 0.3 mg/dL (ref 0.2–1.2)
Total Protein: 7.4 g/dL (ref 6.0–8.3)

## 2022-05-06 LAB — CBC
HCT: 35.8 % — ABNORMAL LOW (ref 36.0–46.0)
Hemoglobin: 11.3 g/dL — ABNORMAL LOW (ref 12.0–15.0)
MCHC: 31.4 g/dL (ref 30.0–36.0)
MCV: 74.7 fl — ABNORMAL LOW (ref 78.0–100.0)
Platelets: 287 10*3/uL (ref 150.0–400.0)
RBC: 4.79 Mil/uL (ref 3.87–5.11)
RDW: 13.9 % (ref 11.5–15.5)
WBC: 8.5 10*3/uL (ref 4.0–10.5)

## 2022-05-06 LAB — TSH: TSH: 1.39 u[IU]/mL (ref 0.35–5.50)

## 2022-05-06 LAB — LIPID PANEL
Cholesterol: 221 mg/dL — ABNORMAL HIGH (ref 0–200)
HDL: 55.7 mg/dL (ref >39.00)
LDL Cholesterol: 138 mg/dL — ABNORMAL HIGH (ref 0–99)
NonHDL: 165.62
Total CHOL/HDL Ratio: 4
Triglycerides: 136 mg/dL (ref 0.0–149.0)
VLDL: 27.2 mg/dL (ref 0.0–40.0)

## 2022-05-06 LAB — VITAMIN D 25 HYDROXY (VIT D DEFICIENCY, FRACTURES): VITD: 38.46 ng/mL (ref 30.00–100.00)

## 2022-05-06 LAB — HEMOGLOBIN A1C: Hgb A1c MFr Bld: 7.1 % — ABNORMAL HIGH (ref 4.6–6.5)

## 2022-05-06 MED ORDER — COMIRNATY 30 MCG/0.3ML IM SUSY
PREFILLED_SYRINGE | INTRAMUSCULAR | 0 refills | Status: DC
  Filled 2022-05-06: qty 0.3, 1d supply, fill #0

## 2022-05-06 NOTE — Patient Instructions (Addendum)
RSV (respiratory syncitial virus) vaccine at pharmacy, Arexvy Covid booster at pharmacy High dose flu shot Prevnar 20 at the pharmacy Shingrix is the new shingles shot, 2 shots over 2-6 months, confirm coverage with insurance and document, then can return here for shots with nurse appt or at pharmacy  Tetanus at pharmacy Space shots by two weeks if not co administered Take Arexvy alone   Preventive Care 65 Years and Older, Female Preventive care refers to lifestyle choices and visits with your health care provider that can promote health and wellness. Preventive care visits are also called wellness exams. What can I expect for my preventive care visit? Counseling Your health care provider may ask you questions about your: Medical history, including: Past medical problems. Family medical history. Pregnancy and menstrual history. History of falls. Current health, including: Memory and ability to understand (cognition). Emotional well-being. Home life and relationship well-being. Sexual activity and sexual health. Lifestyle, including: Alcohol, nicotine or tobacco, and drug use. Access to firearms. Diet, exercise, and sleep habits. Work and work Statistician. Sunscreen use. Safety issues such as seatbelt and bike helmet use. Physical exam Your health care provider will check your: Height and weight. These may be used to calculate your BMI (body mass index). BMI is a measurement that tells if you are at a healthy weight. Waist circumference. This measures the distance around your waistline. This measurement also tells if you are at a healthy weight and may help predict your risk of certain diseases, such as type 2 diabetes and high blood pressure. Heart rate and blood pressure. Body temperature. Skin for abnormal spots. What immunizations do I need?  Vaccines are usually given at various ages, according to a schedule. Your health care provider will recommend vaccines for you based  on your age, medical history, and lifestyle or other factors, such as travel or where you work. What tests do I need? Screening Your health care provider may recommend screening tests for certain conditions. This may include: Lipid and cholesterol levels. Hepatitis C test. Hepatitis B test. HIV (human immunodeficiency virus) test. STI (sexually transmitted infection) testing, if you are at risk. Lung cancer screening. Colorectal cancer screening. Diabetes screening. This is done by checking your blood sugar (glucose) after you have not eaten for a while (fasting). Mammogram. Talk with your health care provider about how often you should have regular mammograms. BRCA-related cancer screening. This may be done if you have a family history of breast, ovarian, tubal, or peritoneal cancers. Bone density scan. This is done to screen for osteoporosis. Talk with your health care provider about your test results, treatment options, and if necessary, the need for more tests. Follow these instructions at home: Eating and drinking  Eat a diet that includes fresh fruits and vegetables, whole grains, lean protein, and low-fat dairy products. Limit your intake of foods with high amounts of sugar, saturated fats, and salt. Take vitamin and mineral supplements as recommended by your health care provider. Do not drink alcohol if your health care provider tells you not to drink. If you drink alcohol: Limit how much you have to 0-1 drink a day. Know how much alcohol is in your drink. In the [place] one drink equals one 12 oz bottle of beer (355 mL), one 5 oz glass of wine (148 mL), or one 1 oz glass of hard liquor (44 mL). Lifestyle Brush your teeth every morning and night with fluoride toothpaste. Floss one time each day. Exercise for at least 30 minutes 5  or more days each week. Do not use any products that contain nicotine or tobacco. These products include cigarettes, chewing tobacco, and vaping devices,  such as e-cigarettes. If you need help quitting, ask your health care provider. Do not use drugs. If you are sexually active, practice safe sex. Use a condom or other form of protection in order to prevent STIs. Take aspirin only as told by your health care provider. Make sure that you understand how much to take and what form to take. Work with your health care provider to find out whether it is safe and beneficial for you to take aspirin daily. Ask your health care provider if you need to take a cholesterol-lowering medicine (statin). Find healthy ways to manage stress, such as: Meditation, yoga, or listening to music. Journaling. Talking to a trusted person. Spending time with friends and family. Minimize exposure to UV radiation to reduce your risk of skin cancer. Safety Always wear your seat belt while driving or riding in a vehicle. Do not drive: If you have been drinking alcohol. Do not ride with someone who has been drinking. When you are tired or distracted. While texting. If you have been using any mind-altering substances or drugs. Wear a helmet and other protective equipment during sports activities. If you have firearms in your house, make sure you follow all gun safety procedures. What's next? Visit your health care provider once a year for an annual wellness visit. Ask your health care provider how often you should have your eyes and teeth checked. Stay up to date on all vaccines. This information is not intended to replace advice given to you by your health care provider. Make sure you discuss any questions you have with your health care provider. Document Revised: 11/28/2020 Document Reviewed: 11/28/2020 Elsevier Patient Education  [place].

## 2022-05-06 NOTE — Progress Notes (Signed)
MyChart Video Visit    Virtual Visit via Video Note   This visit type was conducted due to national recommendations for restrictions regarding the COVID-19 Pandemic (e.g. social distancing) in an effort to limit this patient's exposure and mitigate transmission in our community. This patient is at least at moderate risk for complications without adequate follow up. This format is felt to be most appropriate for this patient at this time. Physical exam was limited by quality of the video and audio technology used for the visit. *** was able to get the patient set up on a video visit.  Patient location: *** Patient and provider in visit Provider location: Office  I discussed the limitations of evaluation and management by telemedicine and the availability of in person appointments. The patient expressed understanding and agreed to proceed.  Visit Date: 05/06/2022  Today's healthcare provider: Penni Homans, MD     Subjective:    Patient ID: Morgan Medina, female    DOB: July 04, 1945, 76 y.o.   MRN: [id number]  Chief Complaint  Patient presents with   Annual Exam    Annul Exam     HPI Patient is in today for ***  Past Medical History:  Diagnosis Date   Anemia    Cerebral aneurysm    h/o in 2006   Dacrocystitis 07/19/2015   right   Diabetes mellitus type 2 in obese ([employer]) 11/05/2014   GERD (gastroesophageal reflux disease) 04/17/2016   Hyperlipidemia    Hypertension    Osteoarthritis of left hip 12/02/2015   Osteopenia 11/22/2015   Overweight     Past Surgical History:  Procedure Laterality Date   ABDOMINAL HYSTERECTOMY     CEREBRAL ANEURYSM REPAIR      Family History  Problem Relation Age of Onset   Cerebral aneurysm Father    Alzheimer's disease Mother    Cancer Maternal Aunt        breast   Diabetes Maternal Grandmother    Stroke Maternal Grandfather    Alzheimer's disease Paternal Grandmother    Stroke Paternal Grandfather     Social History    Socioeconomic History   Marital status: Widowed    Spouse name: Not on file   Number of children: Not on file   Years of education: Not on file   Highest education level: Not on file  Occupational History   Occupation: retired  Tobacco Use   Smoking status: Never   Smokeless tobacco: Never  Vaping Use   Vaping Use: Never used  Substance and Sexual Activity   Alcohol use: No   Drug use: No   Sexual activity: Never    Comment: lives with self, no dietary restrictions. works as a Theme park manager, retired from state employment  Other Topics Concern   Not on file  Social History Narrative   Not on file   Social Determinants of [place] Strain: [place]  (01/06/2022)   Overall Financial Resource Strain (CARDIA)    Difficulty of Paying Living Expenses: Not hard at all  Food Insecurity: No Food Insecurity (01/06/2022)   Hunger Vital Sign    Worried About Running Out of Food in the Last Year: Never true    [place] in the Last Year: Never true  Transportation Needs: No Transportation Needs (11/19/2020)   PRAPARE - Hydrologist (Medical): No    Lack of Transportation (Non-Medical): No  Physical Activity: Inactive (01/06/2022)   Exercise Vital Sign  Days of Exercise per Week: 0 days    Minutes of Exercise per Session: 0 min  Stress: No Stress Concern Present (01/06/2022)   [place]    Feeling of Stress : Not at all  Social Connections: Moderately Isolated (11/19/2020)   Social Connection and Isolation Panel [NHANES]    Frequency of Communication with Friends and Family: More than three times a week    Frequency of Social Gatherings with Friends and Family: More than three times a week    Attends Religious Services: More than 4 times per year    Active Member of [employer] or Organizations: No    Attends Archivist Meetings: Never    Marital Status: Widowed   Intimate Partner Violence: Not At Risk (01/06/2022)   Humiliation, Afraid, Rape, and Kick questionnaire    Fear of Current or Ex-Partner: No    Emotionally Abused: No    Physically Abused: No    Sexually Abused: No    Outpatient Medications Prior to Visit  Medication Sig Dispense Refill   amLODipine (NORVASC) 5 MG tablet TAKE 1 TABLET (5 MG TOTAL) BY MOUTH DAILY. 90 tablet 1   aspirin 81 MG tablet Takes every other day     Blood Glucose Monitoring Suppl ([place]) W/DEVICE KIT Use to check blood sugar. DX. E11.9 1 each 0   carvedilol (COREG) 25 MG tablet TAKE 1 TABLET (25 MG TOTAL) BY MOUTH TWICE A DAY WITH MEALS 180 tablet 1   Cyanocobalamin (VITAMIN B 12) 250 MCG LOZG Take by mouth.     famotidine (PEPCID) 40 MG tablet TAKE 1 TABLET BY MOUTH EVERY DAY 90 tablet 1   ferrous sulfate 325 (65 FE) MG tablet TAKE 1 TABLET BY MOUTH EVERY DAY WITH BREAKFAST 90 tablet 1   furosemide (LASIX) 40 MG tablet Take 1 tablet (40 mg total) by mouth 2 (two) times daily. 40 mg twice daily every other day. On the odd days 40 mg once daily. 200 tablet 3   losartan (COZAAR) 100 MG tablet Take 1 tablet (100 mg total) by mouth every evening. 90 tablet 1   metFORMIN (GLUCOPHAGE-XR) 500 MG 24 hr tablet Take 1 tablet (500 mg total) by mouth daily. 90 tablet 1   Multiple Vitamin (MULTIVITAMIN) capsule Take 1 capsule by mouth daily.     ONE TOUCH LANCETS MISC Use as directed once daily to check blood sugar. DX: E11.9 100 each 0   potassium chloride SA (KLOR-CON M20) 20 MEQ tablet TAKE 1 TABLET BY MOUTH EVERY OTHER DAY 30 tablet 0   RESTASIS 0.05 % ophthalmic emulsion INSTILL 1 DROP INTO BOTH EYES TWICE A DAY  3   rosuvastatin (CRESTOR) 5 MG tablet Take 1 tablet (5 mg total) by mouth every other day. For prevention of heart and vascular disease and to lower cholesterol 45 tablet 3   Vitamin D, Cholecalciferol, 25 MCG (1000 UT) CAPS Take 1,000 Units by mouth daily.     No facility-administered  medications prior to visit.    No Known Allergies  ROS     Objective:    Physical Exam  BP 128/74 (BP Location: Right Arm, Patient Position: Sitting, Cuff Size: Normal)   Pulse 65   Temp 98 F (36.7 C) (Oral)   Resp 16   Ht _0  (1.727 m)   Wt 199 lb 3.2 oz (90.4 kg)   SpO2 95%   BMI 30.29 kg/m  Wt Readings from  Last 3 Encounters:  05/06/22 199 lb 3.2 oz (90.4 kg)  01/06/22 198 lb (89.8 kg)  10/10/21 198 lb (89.8 kg)    Diabetic Foot Exam - Simple   No data filed    Lab Results  Component Value Date   WBC 7.9 10/10/2021   HGB 11.4 (L) 10/10/2021   HCT 36.1 10/10/2021   PLT 257.0 10/10/2021   GLUCOSE 145 (H) 10/10/2021   CHOL 192 10/10/2021   TRIG 123.0 10/10/2021   HDL 56.60 10/10/2021   LDLDIRECT 181.0 01/03/2020   LDLCALC 111 (H) 10/10/2021   ALT 12 10/10/2021   AST 13 10/10/2021   NA 143 10/10/2021   K 3.8 10/10/2021   CL 102 10/10/2021   CREATININE 0.79 10/10/2021   BUN 17 10/10/2021   CO2 31 10/10/2021   TSH 2.03 10/10/2021   HGBA1C 7.2 (H) 10/10/2021   MICROALBUR 0.7 10/10/2021    Lab Results  Component Value Date   TSH 2.03 10/10/2021   Lab Results  Component Value Date   WBC 7.9 10/10/2021   HGB 11.4 (L) 10/10/2021   HCT 36.1 10/10/2021   MCV 75.3 (L) 10/10/2021   PLT 257.0 10/10/2021   Lab Results  Component Value Date   NA 143 10/10/2021   K 3.8 10/10/2021   CO2 31 10/10/2021   GLUCOSE 145 (H) 10/10/2021   BUN 17 10/10/2021   CREATININE 0.79 10/10/2021   BILITOT 0.4 10/10/2021   ALKPHOS 58 10/10/2021   AST 13 10/10/2021   ALT 12 10/10/2021   PROT 7.2 10/10/2021   ALBUMIN 4.2 10/10/2021   CALCIUM 9.6 10/10/2021   ANIONGAP 9 12/04/2017   EGFR 80 11/19/2020   GFR 73.85 10/10/2021   Lab Results  Component Value Date   CHOL 192 10/10/2021   Lab Results  Component Value Date   HDL 56.60 10/10/2021   Lab Results  Component Value Date   LDLCALC 111 (H) 10/10/2021   Lab Results  Component Value Date   TRIG 123.0  10/10/2021   Lab Results  Component Value Date   CHOLHDL 3 10/10/2021   Lab Results  Component Value Date   HGBA1C 7.2 (H) 10/10/2021       Assessment & Plan:   Problem List Items Addressed This Visit   None   I am having Nuvia Coplen maintain her multivitamin, Vitamin B 12, aspirin, ONE TOUCH LANCETS, ONE TOUCH BASIC SYSTEM, ferrous sulfate, Restasis, metFORMIN, losartan, furosemide, amLODipine, potassium chloride SA, famotidine, carvedilol, Vitamin D (Cholecalciferol), and rosuvastatin.  No orders of the defined types were placed in this encounter.   I discussed the assessment and treatment plan with the patient. The patient was provided an opportunity to ask questions and all were answered. The patient agreed with the plan and demonstrated an understanding of the instructions.   The patient was advised to call back or seek an in-person evaluation if the symptoms worsen or if the condition fails to improve as anticipated.  I provided *** minutes of face-to-face time during this encounter.   Penni Homans, MD [facility] [phone] (phone) [phone] (fax)  [place]

## 2022-05-06 NOTE — Progress Notes (Signed)
Subjective:   By signing my name below, I, Kellie Simmering, attest that this documentation has been prepared under the direction and in the presence of Mosie Lukes, MD., 05/06/2022.   Patient ID: Morgan Medina, female    DOB: 1945-08-05, 76 y.o.   MRN: [id number]  Chief Complaint  Patient presents with   Annual Exam    Annul Exam    HPI Patient is in today for a comprehensive physical exam and follow up on chronic medical concerns.  FHx Her 75 year old daughter has been diagnosed with Lupus.  Immunizations Patient has been informed about receiving Covid-19, Pneumonia, RSV, Shingles, and Tetanus immunizations.  Immunization History  Administered Date(s) Administered   Fluad Quad(high Dose 65+) 03/14/2021   Influenza, High Dose Seasonal PF 04/17/2016, 06/04/2017, 07/09/2018, 03/06/2020   Influenza,inj,Quad PF,6+ Mos 04/11/2014, 05/23/2015   PFIZER Comirnaty(Gray Top)Covid-19 Tri-Sucrose Vaccine 02/01/2021   PFIZER(Purple Top)SARS-COV-2 Vaccination 08/09/2019, 08/30/2019, 05/16/2020   Cramping/Diarrhea Patient complains of recurrent episodes of cramping and diarrhea that occur once monthly and last for several days. She reports that the onset of these symptoms is after eating. She further reports that she does not experiences these symptoms when not taking Rosuvastatin 5 mg. Her stools have recently become loose.  Left Shoulder Pain Patient complains of intermittent left shoulder pain that radiates down the left arm. The left shoulder pain has been consistent for 2 months and she describes the pain as aching. She has been sleeping on her right side due to the pain. Heating pads and 2 Ibuprofen provide temporary relief. No falls or trauma. The left arm pain is constant but rubbing the arm alleviates the pain.   Denies fever/chills/nausea/vomiting/CP/palp/ SOB/HA/congestion/ fevers/ GU c/o.  Past Medical History:  Diagnosis Date   Anemia    Cerebral aneurysm    h/o in 2006    Dacrocystitis 07/19/2015   right   Diabetes mellitus type 2 in obese ([place]) 11/05/2014   GERD (gastroesophageal reflux disease) 04/17/2016   Hyperlipidemia    Hypertension    Osteoarthritis of left hip 12/02/2015   Osteopenia 11/22/2015   Overweight    Past Surgical History:  Procedure Laterality Date   ABDOMINAL HYSTERECTOMY     CEREBRAL ANEURYSM REPAIR     Family History  Problem Relation Age of Onset   Cerebral aneurysm Father    Alzheimer's disease Mother    Cancer Maternal Aunt        breast   Diabetes Maternal Grandmother    Stroke Maternal Grandfather    Alzheimer's disease Paternal Grandmother    Stroke Paternal Grandfather    Social History   Socioeconomic History   Marital status: Widowed    Spouse name: Not on file   Number of children: Not on file   Years of education: Not on file   Highest education level: Not on file  Occupational History   Occupation: retired  Tobacco Use   Smoking status: Never   Smokeless tobacco: Never  Vaping Use   Vaping Use: Never used  Substance and Sexual Activity   Alcohol use: No   Drug use: No   Sexual activity: Never    Comment: lives with self, no dietary restrictions. works as a Theme park manager, retired from state employment  Other Topics Concern   Not on file  Social History Narrative   Not on file   Social Determinants of [place] Strain: [place]  (01/06/2022)   Overall Financial Resource Strain (CARDIA)    Difficulty of  Paying Living Expenses: Not hard at all  Food Insecurity: No Food Insecurity (01/06/2022)   Hunger Vital Sign    Worried About Running Out of Food in the Last Year: Never true    Ran Out of Food in the Last Year: Never true  Transportation Needs: No Transportation Needs (11/19/2020)   PRAPARE - Hydrologist (Medical): No    Lack of Transportation (Non-Medical): No  Physical Activity: Inactive (01/06/2022)   Exercise Vital Sign    Days of Exercise per Week: 0  days    Minutes of Exercise per Session: 0 min  Stress: No Stress Concern Present (01/06/2022)   [place]    Feeling of Stress : Not at all  Social Connections: Moderately Isolated (11/19/2020)   Social Connection and Isolation Panel [NHANES]    Frequency of Communication with Friends and Family: More than three times a week    Frequency of Social Gatherings with Friends and Family: More than three times a week    Attends Religious Services: More than 4 times per year    Active Member of [employer] or Organizations: No    Attends Archivist Meetings: Never    Marital Status: Widowed  Intimate Partner Violence: Not At Risk (01/06/2022)   Humiliation, Afraid, Rape, and Kick questionnaire    Fear of Current or Ex-Partner: No    Emotionally Abused: No    Physically Abused: No    Sexually Abused: No   Outpatient Medications Prior to Visit  Medication Sig Dispense Refill   amLODipine (NORVASC) 5 MG tablet TAKE 1 TABLET (5 MG TOTAL) BY MOUTH DAILY. 90 tablet 1   aspirin 81 MG tablet Takes every other day     Blood Glucose Monitoring Suppl ([place]) W/DEVICE KIT Use to check blood sugar. DX. E11.9 1 each 0   carvedilol (COREG) 25 MG tablet TAKE 1 TABLET (25 MG TOTAL) BY MOUTH TWICE A DAY WITH MEALS 180 tablet 1   Cyanocobalamin (VITAMIN B 12) 250 MCG LOZG Take by mouth.     famotidine (PEPCID) 40 MG tablet TAKE 1 TABLET BY MOUTH EVERY DAY 90 tablet 1   ferrous sulfate 325 (65 FE) MG tablet TAKE 1 TABLET BY MOUTH EVERY DAY WITH BREAKFAST 90 tablet 1   furosemide (LASIX) 40 MG tablet Take 1 tablet (40 mg total) by mouth 2 (two) times daily. 40 mg twice daily every other day. On the odd days 40 mg once daily. 200 tablet 3   losartan (COZAAR) 100 MG tablet Take 1 tablet (100 mg total) by mouth every evening. 90 tablet 1   metFORMIN (GLUCOPHAGE-XR) 500 MG 24 hr tablet Take 1 tablet (500 mg total) by mouth daily. 90  tablet 1   Multiple Vitamin (MULTIVITAMIN) capsule Take 1 capsule by mouth daily.     ONE TOUCH LANCETS MISC Use as directed once daily to check blood sugar. DX: E11.9 100 each 0   potassium chloride SA (KLOR-CON M20) 20 MEQ tablet TAKE 1 TABLET BY MOUTH EVERY OTHER DAY 30 tablet 0   RESTASIS 0.05 % ophthalmic emulsion INSTILL 1 DROP INTO BOTH EYES TWICE A DAY  3   rosuvastatin (CRESTOR) 5 MG tablet Take 1 tablet (5 mg total) by mouth every other day. For prevention of heart and vascular disease and to lower cholesterol 45 tablet 3   Vitamin D, Cholecalciferol, 25 MCG (1000 UT) CAPS Take 1,000 Units by mouth  daily.     No facility-administered medications prior to visit.   No Known Allergies  Review of Systems  Constitutional:  Negative for chills and fever.  HENT:  Negative for congestion.   Respiratory:  Negative for shortness of breath.   Cardiovascular:  Negative for chest pain and palpitations.  Gastrointestinal:  Positive for diarrhea. Negative for abdominal pain, blood in stool, constipation, nausea and vomiting.       (+) cramping.  Genitourinary:  Negative for dysuria, frequency, hematuria and urgency.  Musculoskeletal:        (+) Left shoulder pain. (+) Left arm pain.  Skin:        (-) New moles.  Neurological:  Negative for headaches.      Objective:    Physical Exam Constitutional:      General: She is not in acute distress.    Appearance: Normal appearance. She is normal weight. She is not ill-appearing.  HENT:     Head: Normocephalic and atraumatic.     Right Ear: Tympanic membrane, ear canal and external ear normal.     Left Ear: Tympanic membrane, ear canal and external ear normal.     Nose: Nose normal.     Mouth/Throat:     Mouth: Mucous membranes are moist.     Pharynx: Oropharynx is clear.  Eyes:     General:        Right eye: No discharge.        Left eye: No discharge.     Extraocular Movements: Extraocular movements intact.     Right eye: No  nystagmus.     Left eye: No nystagmus.     Pupils: Pupils are equal, round, and reactive to light.  Neck:     Vascular: No carotid bruit.  Cardiovascular:     Rate and Rhythm: Normal rate and regular rhythm.     Pulses: Normal pulses.     Heart sounds: Normal heart sounds. No murmur heard.    No gallop.  Pulmonary:     Effort: Pulmonary effort is normal. No respiratory distress.     Breath sounds: Normal breath sounds. No wheezing or rales.  Abdominal:     General: Bowel sounds are normal.     Palpations: Abdomen is soft.     Tenderness: There is no abdominal tenderness. There is no guarding.  Musculoskeletal:        General: Normal range of motion.     Right lower leg: No edema.     Left lower leg: No edema.     Comments: Muscle strength 5/5 on upper and lower extremities.   Feet:     Right foot:     Protective Sensation: 5 sites tested.  5 sites sensed.     Skin integrity: Skin integrity normal.     Left foot:     Protective Sensation: 5 sites tested.  5 sites sensed.     Skin integrity: Skin integrity normal.  Lymphadenopathy:     Cervical: No cervical adenopathy.  Skin:    General: Skin is warm and dry.  Neurological:     Mental Status: She is alert and oriented to person, place, and time.     Sensory: Sensation is intact.     Motor: Motor function is intact.     Coordination: Coordination is intact.     Deep Tendon Reflexes:     Reflex Scores:      Patellar reflexes are 2+ on the right side and 2+  on the left side. Psychiatric:        Mood and Affect: Mood normal.        Behavior: Behavior normal.        Judgment: Judgment normal.     BP 128/74 (BP Location: Right Arm, Patient Position: Sitting, Cuff Size: Normal)   Pulse 65   Temp 98 F (36.7 C) (Oral)   Resp 16   Ht _0  (1.727 m)   Wt 199 lb 3.2 oz (90.4 kg)   SpO2 95%   BMI 30.29 kg/m  Wt Readings from Last 3 Encounters:  05/06/22 199 lb 3.2 oz (90.4 kg)  01/06/22 198 lb (89.8 kg)  10/10/21 198  lb (89.8 kg)   Diabetic Foot Exam - Simple   No data filed    Lab Results  Component Value Date   WBC 7.9 10/10/2021   HGB 11.4 (L) 10/10/2021   HCT 36.1 10/10/2021   PLT 257.0 10/10/2021   GLUCOSE 145 (H) 10/10/2021   CHOL 192 10/10/2021   TRIG 123.0 10/10/2021   HDL 56.60 10/10/2021   LDLDIRECT 181.0 01/03/2020   LDLCALC 111 (H) 10/10/2021   ALT 12 10/10/2021   AST 13 10/10/2021   NA 143 10/10/2021   K 3.8 10/10/2021   CL 102 10/10/2021   CREATININE 0.79 10/10/2021   BUN 17 10/10/2021   CO2 31 10/10/2021   TSH 2.03 10/10/2021   HGBA1C 7.2 (H) 10/10/2021   MICROALBUR 0.7 10/10/2021   Lab Results  Component Value Date   TSH 2.03 10/10/2021   Lab Results  Component Value Date   WBC 7.9 10/10/2021   HGB 11.4 (L) 10/10/2021   HCT 36.1 10/10/2021   MCV 75.3 (L) 10/10/2021   PLT 257.0 10/10/2021   Lab Results  Component Value Date   NA 143 10/10/2021   K 3.8 10/10/2021   CO2 31 10/10/2021   GLUCOSE 145 (H) 10/10/2021   BUN 17 10/10/2021   CREATININE 0.79 10/10/2021   BILITOT 0.4 10/10/2021   ALKPHOS 58 10/10/2021   AST 13 10/10/2021   ALT 12 10/10/2021   PROT 7.2 10/10/2021   ALBUMIN 4.2 10/10/2021   CALCIUM 9.6 10/10/2021   ANIONGAP 9 12/04/2017   EGFR 80 11/19/2020   GFR 73.85 10/10/2021   Lab Results  Component Value Date   CHOL 192 10/10/2021   Lab Results  Component Value Date   HDL 56.60 10/10/2021   Lab Results  Component Value Date   LDLCALC 111 (H) 10/10/2021   Lab Results  Component Value Date   TRIG 123.0 10/10/2021   Lab Results  Component Value Date   CHOLHDL 3 10/10/2021   Lab Results  Component Value Date   HGBA1C 7.2 (H) 10/10/2021   Colonoscopy: Last completed on 02/08/2021.  -Right and left colon biopsied. -2 polyps (3 mm) found and removed. -Small internal hemorrhoids. Repeat in 5 years.   DEXA: Last completed on 01/12/2020. The BMD measured at Femur Neck Left is 0.888 g/cm2 with a T-score of -1.1. This patient  is considered osteopenic according to [facility]) criteria. L-4 was excluded due to degenerative changes. Compared with the prior study on, 11/22/2015 the BMD of the total mean shows no statistically signficant change. The scan quality is good. Repeat in 5 years.   Mammogram: Last completed on 01/12/2020. No mammographic evidence of malignancy. Repeat in 1-2 years.  Pap Smear: Last completed on 07/19/2014. Normal results.     Assessment & Plan:   Problem  List Items Addressed This Visit   None  No orders of the defined types were placed in this encounter.  I, Kellie Simmering, personally preformed the services described in this documentation.  All medical record entries made by the scribe were at my direction and in my presence.  I have reviewed the chart and discharge instructions (if applicable) and agree that the record reflects my personal performance and is accurate and complete. 05/06/2022  I,Mohammed Iqbal,acting as a scribe for Penni Homans, MD.,have documented all relevant documentation on the behalf of Penni Homans, MD,as directed by  Penni Homans, MD while in the presence of Penni Homans, MD.  Kellie Simmering

## 2022-05-07 ENCOUNTER — Other Ambulatory Visit (INDEPENDENT_AMBULATORY_CARE_PROVIDER_SITE_OTHER): Payer: Medicare PPO

## 2022-05-07 ENCOUNTER — Encounter: Payer: Self-pay | Admitting: Family Medicine

## 2022-05-07 DIAGNOSIS — M5412 Radiculopathy, cervical region: Secondary | ICD-10-CM | POA: Insufficient documentation

## 2022-05-07 DIAGNOSIS — D509 Iron deficiency anemia, unspecified: Secondary | ICD-10-CM

## 2022-05-07 DIAGNOSIS — M25512 Pain in left shoulder: Secondary | ICD-10-CM | POA: Insufficient documentation

## 2022-05-07 LAB — IBC + FERRITIN
Ferritin: 593.6 ng/mL — ABNORMAL HIGH (ref 10.0–291.0)
Iron: 86 ug/dL (ref 42–145)
Saturation Ratios: 25.1 % (ref 20.0–50.0)
TIBC: 343 ug/dL (ref 250.0–450.0)
Transferrin: 245 mg/dL (ref 212.0–360.0)

## 2022-05-07 NOTE — Assessment & Plan Note (Signed)
Encourage heart healthy diet such as MIND or DASH diet, increase exercise, avoid trans fats, simple carbohydrates and processed foods, consider a krill or fish or flaxseed oil cap daily.  Tolerating Rosuvasttin

## 2022-05-07 NOTE — Assessment & Plan Note (Addendum)
Patient encouraged to maintain heart healthy diet, regular exercise, adequate sleep. Consider daily probiotics. Take medications as prescribed. Labs ordered and reviewed  Patient declines pap  DEXA: Last completed on 01/12/2020. The BMD measured at Femur Neck Left is 0.888 g/cm2 with a T-score of -1.1. This patient is considered osteopenic according to [facility]) criteria. L-4 was excluded due to degenerative changes. Compared with the prior study on, 11/22/2015 the BMD of the total mean shows no statistically signficant change. The scan quality is good. Repeat in 5 years.   Mammogram: Last completed on 01/12/2020. No mammographic evidence of malignancy. Repeat in 1-2 years.  Pap Smear: Last completed on 07/19/2014. Normal results.  High dose flu shot given today

## 2022-05-07 NOTE — Assessment & Plan Note (Signed)
Encouraged to get adequate exercise, calcium and vitamin d intake 

## 2022-05-07 NOTE — Assessment & Plan Note (Signed)
Encouraged moist heat and gentle stretching as tolerated. May try NSAIDs and prescription meds as directed and report if symptoms worsen or seek immediate care. Has been present for  couple of months will refer her back to Sports Medicine for evaluation and treatment. No fall or trauma noted.

## 2022-05-07 NOTE — Assessment & Plan Note (Signed)
Monitor, no supplementation

## 2022-05-07 NOTE — Assessment & Plan Note (Signed)
Well controlled, no changes to meds. Encouraged heart healthy diet such as the DASH diet and exercise as tolerated.  °

## 2022-05-07 NOTE — Assessment & Plan Note (Signed)
Supplement and monitor 

## 2022-05-07 NOTE — Assessment & Plan Note (Signed)
Increase leafy greens, consider increased lean red meat and using cast iron cookware. Continue to monitor, report any concerns. Stable, mild asymptomatic, colonoscopy last year. Iron studies are pending

## 2022-05-13 ENCOUNTER — Ambulatory Visit: Payer: Self-pay

## 2022-05-13 ENCOUNTER — Encounter: Payer: Self-pay | Admitting: Family Medicine

## 2022-05-13 ENCOUNTER — Ambulatory Visit: Payer: Medicare PPO | Admitting: Family Medicine

## 2022-05-13 VITALS — BP 128/70 | Ht 68.0 in | Wt 199.0 lb

## 2022-05-13 DIAGNOSIS — M5412 Radiculopathy, cervical region: Secondary | ICD-10-CM | POA: Diagnosis not present

## 2022-05-13 MED ORDER — METHYLPREDNISOLONE ACETATE 40 MG/ML IJ SUSP
40.0000 mg | Freq: Once | INTRAMUSCULAR | Status: AC
  Administered 2022-05-13: 40 mg via INTRA_ARTICULAR

## 2022-05-13 MED ORDER — GABAPENTIN 100 MG PO CAPS: 100.0000 mg | ORAL_CAPSULE | Freq: Three times a day (TID) | ORAL | 1 refills | Status: DC

## 2022-05-13 NOTE — Assessment & Plan Note (Signed)
Acutely occurring.  More consistent with a radiculopathy given the degenerative changes appreciated in the cervical spine.   -Counseled on home exercise therapy and supportive care. -Trigger point injections today. -Gabapentin. -Could consider physical therapy.

## 2022-05-13 NOTE — Patient Instructions (Signed)
Nice to meet you Please continue heat  Please try the exercises   You can start the gabapentin one week. Please start with one pill at night and you can increase to 2 or 3 times daily as you tolerate  Please send me a message in MyChart with any questions or updates.  Please see me back in 4 weeks.   --Dr. Raeford Razor

## 2022-05-13 NOTE — Progress Notes (Signed)
  Morgan Medina - 76 y.o. female MRN [id number]  Date of birth: March 10, 1946  SUBJECTIVE:  Including CC & ROS.  No chief complaint on file.   Morgan Medina is a 76 y.o. female that is presenting with left trapezius and shoulder and arm pain.  The pain has Medina ongoing for 2 months.  She does notice pain that goes down to her left hand.  No injury or inciting event.  She has tried a heating pad.  No history of surgery.    Review of Systems See HPI   HISTORY: Past Medical, Surgical, Social, and Family History Reviewed & Updated per EMR.   Pertinent Historical Findings include:  Past Medical History:  Diagnosis Date   Anemia    Cerebral aneurysm    h/o in 2006   Dacrocystitis 07/19/2015   right   Diabetes mellitus type 2 in obese ([place]) 11/05/2014   GERD (gastroesophageal reflux disease) 04/17/2016   Hyperlipidemia    Hypertension    Osteoarthritis of left hip 12/02/2015   Osteopenia 11/22/2015   Overweight     Past Surgical History:  Procedure Laterality Date   ABDOMINAL HYSTERECTOMY     CEREBRAL ANEURYSM REPAIR       PHYSICAL EXAM:  VS: BP 128/70   Ht '5\' 8"'$  (1.727 m)   Wt 199 lb (90.3 kg)   BMI 30.26 kg/m  Physical Exam Gen: NAD, alert, cooperative with exam, well-appearing MSK:  Neurovascularly intact    Limited ultrasound: Left trapezius:  No changes appreciated of the musculature in the area  Summary: Normal findings  Ultrasound and interpretation by Clearance Coots, MD  Aspiration/Injection Procedure Note Morgan Medina [birth year]  Procedure: Injection Indications: Left trapezius pain  Procedure Details Consent: Risks of procedure as well as the alternatives and risks of each were explained to the (patient/caregiver).  Consent for procedure obtained. Time Out: Verified patient identification, verified procedure, site/side was marked, verified correct patient position, special equipment/implants available, medications/allergies/relevent history reviewed, required  imaging and test results available.  Performed.  The area was cleaned with iodine and alcohol swabs.    The left trapezius was injected using 1 cc's of 40 mg of Depo Medrol and 4 cc's of 0.25% bupivacaine with a 25 1 1/2" needle.  Ultrasound was used. Images were obtained in short views showing the injection.     A sterile dressing was applied.  Patient did tolerate procedure well.     ASSESSMENT & PLAN:   Cervical radiculopathy Acutely occurring.  More consistent with a radiculopathy given the degenerative changes appreciated in the cervical spine.   -Counseled on home exercise therapy and supportive care. -Trigger point injections today. -Gabapentin. -Could consider physical therapy.

## 2022-05-16 ENCOUNTER — Encounter: Payer: Self-pay | Admitting: Family Medicine

## 2022-06-02 ENCOUNTER — Ambulatory Visit (HOSPITAL_BASED_OUTPATIENT_CLINIC_OR_DEPARTMENT_OTHER)
Admission: RE | Admit: 2022-06-02 | Discharge: 2022-06-02 | Disposition: A | Discharge status: Home / Self Care | Payer: Medicare PPO | Source: Ambulatory Visit | Attending: Family Medicine | Admitting: Family Medicine

## 2022-06-02 ENCOUNTER — Encounter (HOSPITAL_BASED_OUTPATIENT_CLINIC_OR_DEPARTMENT_OTHER): Payer: Self-pay

## 2022-06-02 DIAGNOSIS — Z1239 Encounter for other screening for malignant neoplasm of breast: Secondary | ICD-10-CM | POA: Insufficient documentation

## 2022-06-02 DIAGNOSIS — Z1231 Encounter for screening mammogram for malignant neoplasm of breast: Secondary | ICD-10-CM | POA: Insufficient documentation

## 2022-06-02 DIAGNOSIS — Z780 Asymptomatic menopausal state: Secondary | ICD-10-CM | POA: Insufficient documentation

## 2022-06-02 DIAGNOSIS — M85832 Other specified disorders of bone density and structure, left forearm: Secondary | ICD-10-CM | POA: Diagnosis not present

## 2022-06-04 ENCOUNTER — Other Ambulatory Visit: Payer: Self-pay | Admitting: Family Medicine

## 2022-06-04 DIAGNOSIS — R928 Other abnormal and inconclusive findings on diagnostic imaging of breast: Secondary | ICD-10-CM

## 2022-06-18 ENCOUNTER — Ambulatory Visit: Payer: Medicare PPO | Admitting: Family Medicine

## 2022-06-18 VITALS — BP 154/90 | Ht 67.0 in | Wt 194.0 lb

## 2022-06-18 DIAGNOSIS — M5412 Radiculopathy, cervical region: Secondary | ICD-10-CM | POA: Diagnosis not present

## 2022-06-18 NOTE — Patient Instructions (Signed)
Good to see you Please continue the heat  I have made a referral to physical therapy   Please send me a message in MyChart with any questions or updates.  Please see me back in 4-6 weeks.   --Dr. Raeford Razor

## 2022-06-18 NOTE — Assessment & Plan Note (Signed)
Ongoing.  Having improvement of the pain and does notice intermittent altered sensation on the left arm.  We have tried trigger point injections with improvement for about a week. -Counseled on home exercise therapy and supportive care. -Continue gabapentin at night. -Referral to physical therapy. -Could consider further imaging

## 2022-06-18 NOTE — Progress Notes (Signed)
  Morgan Medina - 77 y.o. female MRN [id number]  Date of birth: 07-05-1945  SUBJECTIVE:  Including CC & ROS.  No chief complaint on file.   Morgan Medina is a 77 y.o. female that is following up for her radicular pain.  She received relief with the previous injection but the pain returned about after a week.  Continues to have mild pain and altered sensation down the left arm.  Use of the gabapentin at night.    Review of Systems See HPI   HISTORY: Past Medical, Surgical, Social, and Family History Reviewed & Updated per EMR.   Pertinent Historical Findings include:  Past Medical History:  Diagnosis Date   Anemia    Cerebral aneurysm    h/o in 2006   Dacrocystitis 07/19/2015   right   Diabetes mellitus type 2 in obese ([place]) 11/05/2014   GERD (gastroesophageal reflux disease) 04/17/2016   Hyperlipidemia    Hypertension    Osteoarthritis of left hip 12/02/2015   Osteopenia 11/22/2015   Overweight     Past Surgical History:  Procedure Laterality Date   ABDOMINAL HYSTERECTOMY     CEREBRAL ANEURYSM REPAIR       PHYSICAL EXAM:  VS: BP (!) 154/90   Ht '5\' 7"'$  (1.702 m)   Wt 194 lb (88 kg)   BMI 30.38 kg/m  Physical Exam Gen: NAD, alert, cooperative with exam, well-appearing MSK:  Neurovascularly intact       ASSESSMENT & PLAN:   Cervical radiculopathy Ongoing.  Having improvement of the pain and does notice intermittent altered sensation on the left arm.  We have tried trigger point injections with improvement for about a week. -Counseled on home exercise therapy and supportive care. -Continue gabapentin at night. -Referral to physical therapy. -Could consider further imaging

## 2022-06-24 ENCOUNTER — Ambulatory Visit: Payer: Medicare PPO

## 2022-06-24 ENCOUNTER — Ambulatory Visit
Admission: RE | Admit: 2022-06-24 | Discharge: 2022-06-24 | Disposition: A | Discharge status: Home / Self Care | Payer: Medicare PPO | Source: Ambulatory Visit | Attending: Family Medicine | Admitting: Family Medicine

## 2022-06-24 DIAGNOSIS — R922 Inconclusive mammogram: Secondary | ICD-10-CM | POA: Diagnosis not present

## 2022-06-24 DIAGNOSIS — R928 Other abnormal and inconclusive findings on diagnostic imaging of breast: Secondary | ICD-10-CM

## 2022-07-16 ENCOUNTER — Ambulatory Visit: Payer: Medicare PPO | Admitting: Family Medicine

## 2022-07-16 ENCOUNTER — Other Ambulatory Visit: Payer: Self-pay | Admitting: Family Medicine

## 2022-08-04 ENCOUNTER — Encounter: Payer: Self-pay | Admitting: Physical Therapy

## 2022-08-04 ENCOUNTER — Ambulatory Visit: Payer: Medicare PPO | Attending: Family Medicine | Admitting: Physical Therapy

## 2022-08-04 DIAGNOSIS — M5412 Radiculopathy, cervical region: Secondary | ICD-10-CM | POA: Insufficient documentation

## 2022-08-04 DIAGNOSIS — R252 Cramp and spasm: Secondary | ICD-10-CM | POA: Insufficient documentation

## 2022-08-04 DIAGNOSIS — R293 Abnormal posture: Secondary | ICD-10-CM | POA: Diagnosis not present

## 2022-08-04 DIAGNOSIS — M542 Cervicalgia: Secondary | ICD-10-CM

## 2022-08-04 NOTE — Therapy (Signed)
OUTPATIENT PHYSICAL THERAPY CERVICAL EVALUATION   Patient Name: Morgan Medina MRN: [id number] DOB:[birth year], 77 y.o., female Today's Date: 08/04/2022  END OF SESSION:  PT End of Session - 08/04/22 1458     Visit Number 1    Number of Visits 12    Date for PT Re-Evaluation 09/15/22    Authorization Type [employer]    Authorization Time Period pending authorization.    Progress Note Due on Visit 10    PT Start Time 1450    PT Stop Time 1529    PT Time Calculation (min) 39 min    Activity Tolerance Patient tolerated treatment well    Behavior During Therapy WFL for tasks assessed/performed             Past Medical History:  Diagnosis Date   Anemia    Cerebral aneurysm    h/o in 2006   Dacrocystitis 07/19/2015   right   Diabetes mellitus type 2 in obese ([place]) 11/05/2014   GERD (gastroesophageal reflux disease) 04/17/2016   Hyperlipidemia    Hypertension    Osteoarthritis of left hip 12/02/2015   Osteopenia 11/22/2015   Overweight    Past Surgical History:  Procedure Laterality Date   ABDOMINAL HYSTERECTOMY     CEREBRAL ANEURYSM REPAIR     Patient Active Problem List   Diagnosis Date Noted   Cervical radiculopathy 05/07/2022   Cough 10/13/2021   Change in bowel habit 11/30/2020   Coronary artery disease involving native coronary artery of native heart without angina pectoris 11/19/2020   Grade I diastolic dysfunction 0000000   Shortness of breath 09/05/2020   DOE (dyspnea on exertion) 09/05/2020   Bilateral leg edema 09/05/2020   Nonspecific abnormal electrocardiogram (ECG) (EKG) 09/05/2020   Fatigue 09/05/2020   Bradycardia, sinus 09/05/2020   Type 2 diabetes mellitus with hyperosmolarity without coma, without long-term current use of insulin ([place]) 09/05/2020   High serum vitamin B12 08/20/2020   Allergies 11/09/2018   Dry eyes, bilateral 02/02/2018   Cataracts, bilateral 02/02/2018   Vitamin D deficiency 02/02/2018   Neck pain 12/23/2017   GERD  (gastroesophageal reflux disease) 04/17/2016   Osteoarthritis of left hip 12/02/2015   Preventative health care 11/22/2015   Osteopenia 11/22/2015   Dacrocystitis 07/19/2015   Left-sided chest wall pain 05/11/2015   Overweight    Herpes zoster  history of facial zoster 05/02/2014   Anemia 11/28/2013   HTN (hypertension) 11/28/2013   Hyperlipidemia 11/28/2013   S/P abdominal supracervical subtotal hysterectomy 11/28/2013   Colon polyps 11/28/2013   Cerebral aneurysm  S/P coiling  Mikel Cella  2006   11/28/2013   Menopause 11/28/2013    PCP: Mosie Lukes, MD   REFERRING PROVIDER: Rosemarie Ax, MD   REFERRING DIAG: [phone] (ICD-10-CM) - Cervical radiculopathy  THERAPY DIAG:  Radiculopathy, cervical region  Cervicalgia  Cramp and spasm  Abnormal posture  Rationale for Evaluation and Treatment: Rehabilitation  ONSET DATE: couple of months ago   SUBJECTIVE:  SUBJECTIVE STATEMENT: Morgan Medina reports pain started a couple of months ago, had no injury.  Its a constant ache, going down L shoulder, she's been taking gabapentin at night (can't take it at day and function).  Normally she sleeps on her L side, now she has to sleep on her right side.  She was given an injection but it lasted maybe a week.   Not going down her arm as much now, seemed to ease off after cortisone.   PERTINENT HISTORY:  T2DM, HTN, CAD, osteopenia  PAIN:  Are you having pain? Yes: NPRS scale: 6/10 Pain location: L upper shoulder Pain description: nagging, dull ache Aggravating factors: worst 8-9/10 at night Relieving factors: ibuprofen, gabapentin  PRECAUTIONS: None  WEIGHT BEARING RESTRICTIONS: No  FALLS:  Has patient fallen in last 6 months? No  LIVING ENVIRONMENT: Lives with: lives with  their daughter Lives in: House/apartment Stairs: Yes: Internal: 12 steps; on right going up, on left going up, and can reach both and External: 3 steps; none Has following equipment at home: None  OCCUPATION: retired  PLOF: Independent and Leisure: volunteers at [employer] 3x/week (counseling, prayer meetings)  PATIENT GOALS: get rid of pain  NEXT MD VISIT: not scheduled  OBJECTIVE:   DIAGNOSTIC FINDINGS:  No relevant imaging  PATIENT SURVEYS:  NDI 14/50= 28% impairment  COGNITION: Overall cognitive status: Within functional limits for tasks assessed  SENSATION: WFL  POSTURE: rounded shoulders and forward head  PALPATION: Tenderness/tightness cervical paraspinals (L>R), L UT, L levator scapulae.    CERVICAL ROM:   Active ROM AROM (deg) eval  Flexion 45  Extension 10  Right lateral flexion 30  Left lateral flexion 25  Right rotation 42  Left rotation 50   (Blank rows = not tested)  UPPER EXTREMITY ROM:  Active ROM Right eval Left eval  Shoulder flexion 110 115  Shoulder extension 70 70  Shoulder abduction 115 125  Shoulder adduction 65 60  Shoulder extension    Shoulder internal rotation (functional) Small of back Small of back  Shoulder external rotation (functional) Scapular spine Scapular spine   (Blank rows = not tested)  UPPER EXTREMITY MMT:  MMT Right eval Left eval  Shoulder flexion 5 5  Shoulder extension    Shoulder abduction 5 5  Shoulder internal rotation 5 5  Shoulder external rotation 5 4+  Middle trapezius    Lower trapezius    Elbow flexion 5 5  Elbow extension 5 5  Wrist flexion 5 5  Wrist extension 5 5  Grip strength fair fair   (Blank rows = not tested)  CERVICAL SPECIAL TESTS:  Spurling's test: Negative  FUNCTIONAL TESTS:  NT  TODAY'S TREATMENT:                                                                                                                              DATE:   08/04/22 Therapeutic Exercise: to improve  strength and mobility.  Demo,  verbal and tactile cues throughout for technique.  Retro shoulder rolls x 10, chin tucks x 10, scap squeezes x 10, levator stretch 2 x 15 sec hold.   PATIENT EDUCATION:  Education details: education on findings, POC, TrDN, initial HEP Person educated: Patient Education method: Consulting civil engineer, Media planner, Verbal cues, and Handouts Education comprehension: verbalized understanding and returned demonstration  HOME EXERCISE PROGRAM: Access Code: [email or web address] URL: [email or web address] Date: 08/04/2022 Prepared by: Glenetta Hew  Exercises - Seated Shoulder Rolls  - 3 x daily - 7 x weekly - 1 sets - 10 reps - Seated Cervical Retraction  - 3 x daily - 7 x weekly - 1 sets - 10 reps - 3-5 sec  hold - Seated Scapular Retraction  - 3 x daily - 7 x weekly - 1 sets - 10 reps - 3-5 sec  hold - Gentle Levator Scapulae Stretch  - 1 x daily - 7 x weekly - 1 sets - 3 reps - 15 sec  hold  ASSESSMENT:  CLINICAL IMPRESSION: Patient is a 77 y.o. right hand dominant female who was seen today for physical therapy evaluation and treatment for cervical radiculopathy with pain in Left upper shoulder.  Reports numbness/tingling down LUE has resolved, pain is now focused primarily left levator scapulae and Upper trap.  She demonstrates forward head posture, decreased cervical and shoulder ROM bil. Morgan Medina would benefit from skilled physical therapy to decrease pain and improve ROM in order to improve QOL.    OBJECTIVE IMPAIRMENTS: decreased ROM, decreased strength, hypomobility, increased fascial restrictions, increased muscle spasms, postural dysfunction, and pain.   ACTIVITY LIMITATIONS: carrying, lifting, bending, and sleeping  PARTICIPATION LIMITATIONS: cleaning, laundry, driving, and community activity  PERSONAL FACTORS: Age, Time since onset of injury/illness/exacerbation, and 1-2 comorbidities: T2DM, osteopenia, CAD  are also affecting patient's functional  outcome.   REHAB POTENTIAL: Excellent  CLINICAL DECISION MAKING: Stable/uncomplicated  EVALUATION COMPLEXITY: Low   GOALS: Goals reviewed with patient? Yes  SHORT TERM GOALS: Target date: 08/18/2022   Patient will be independent with initial HEP.  Baseline: given Goal status: INITIAL   LONG TERM GOALS: Target date: 09/15/2022   Patient will be independent with advanced/ongoing HEP to improve outcomes and carryover.  Baseline:  Goal status: INITIAL  2.  Patient will report 75% improvement in neck pain to improve QOL.  Baseline:  Goal status: INITIAL  3.  Patient will demonstrate 10 deg improvement in cervical ROM all directions for safety with driving.  Baseline: see objective Goal status: INITIAL  4.  Patient will report 15% on NDI to demonstrate improved functional ability.  Baseline: 28% impairment Goal status: INITIAL  5.  Patient will demonstrate improved posture to decrease muscle imbalance. Baseline: forward head posture Goal status: INITIAL  PLAN:  PT FREQUENCY: 1-2x/week  PT DURATION: 6 weeks  PLANNED INTERVENTIONS: Therapeutic exercises, Therapeutic activity, Neuromuscular re-education, Balance training, Gait training, Patient/Family education, Self Care, Joint mobilization, Dry Needling, Electrical stimulation, Spinal mobilization, Cryotherapy, Moist heat, Traction, Ultrasound, Manual therapy, and Re-evaluation  PLAN FOR NEXT SESSION: review and progress postural exercises, manual therapy, TrDN if willing.  Modalities PRN   Rennie Natter, PT, DPT  08/04/2022, 3:31 PM

## 2022-08-04 NOTE — Patient Instructions (Signed)

## 2022-08-06 ENCOUNTER — Ambulatory Visit: Payer: Medicare PPO | Admitting: Physical Therapy

## 2022-08-06 ENCOUNTER — Encounter: Payer: Self-pay | Admitting: Physical Therapy

## 2022-08-06 DIAGNOSIS — R252 Cramp and spasm: Secondary | ICD-10-CM

## 2022-08-06 DIAGNOSIS — M5412 Radiculopathy, cervical region: Secondary | ICD-10-CM | POA: Diagnosis not present

## 2022-08-06 DIAGNOSIS — R293 Abnormal posture: Secondary | ICD-10-CM | POA: Diagnosis not present

## 2022-08-06 DIAGNOSIS — M542 Cervicalgia: Secondary | ICD-10-CM

## 2022-08-06 NOTE — Therapy (Signed)
OUTPATIENT PHYSICAL THERAPY CERVICAL EVALUATION   Patient Name: Morgan Medina MRN: [id number] DOB:25-Sep-1945, 77 y.o., female Today's Date: 08/06/2022  END OF SESSION:  PT End of Session - 08/06/22 1623     Visit Number 2    Number of Visits 12    Date for PT Re-Evaluation 09/15/22    Authorization Type [employer]    Authorization Time Period pending authorization.    Progress Note Due on Visit 10    PT Start Time 1616    Activity Tolerance Patient tolerated treatment well    Behavior During Therapy [facility] for tasks assessed/performed             Past Medical History:  Diagnosis Date   Anemia    Cerebral aneurysm    h/o in 2006   Dacrocystitis 07/19/2015   right   Diabetes mellitus type 2 in obese ([place]) 11/05/2014   GERD (gastroesophageal reflux disease) 04/17/2016   Hyperlipidemia    Hypertension    Osteoarthritis of left hip 12/02/2015   Osteopenia 11/22/2015   Overweight    Past Surgical History:  Procedure Laterality Date   ABDOMINAL HYSTERECTOMY     CEREBRAL ANEURYSM REPAIR     Patient Active Problem List   Diagnosis Date Noted   Cervical radiculopathy 05/07/2022   Cough 10/13/2021   Change in bowel habit 11/30/2020   Coronary artery disease involving native coronary artery of native heart without angina pectoris 11/19/2020   Grade I diastolic dysfunction 0000000   Shortness of breath 09/05/2020   DOE (dyspnea on exertion) 09/05/2020   Bilateral leg edema 09/05/2020   Nonspecific abnormal electrocardiogram (ECG) (EKG) 09/05/2020   Fatigue 09/05/2020   Bradycardia, sinus 09/05/2020   Type 2 diabetes mellitus with hyperosmolarity without coma, without long-term current use of insulin ([place]) 09/05/2020   High serum vitamin B12 08/20/2020   Allergies 11/09/2018   Dry eyes, bilateral 02/02/2018   Cataracts, bilateral 02/02/2018   Vitamin D deficiency 02/02/2018   Neck pain 12/23/2017   GERD (gastroesophageal reflux disease) 04/17/2016   Osteoarthritis of left  hip 12/02/2015   Preventative health care 11/22/2015   Osteopenia 11/22/2015   Dacrocystitis 07/19/2015   Left-sided chest wall pain 05/11/2015   Overweight    Herpes zoster  history of facial zoster 05/02/2014   Anemia 11/28/2013   HTN (hypertension) 11/28/2013   Hyperlipidemia 11/28/2013   S/P abdominal supracervical subtotal hysterectomy 11/28/2013   Colon polyps 11/28/2013   Cerebral aneurysm  S/P coiling  Mikel Cella  2006   11/28/2013   Menopause 11/28/2013    PCP: Mosie Lukes, MD   REFERRING PROVIDER: Rosemarie Ax, MD   REFERRING DIAG: [phone] (ICD-10-CM) - Cervical radiculopathy  THERAPY DIAG:  Radiculopathy, cervical region  Cervicalgia  Cramp and spasm  Abnormal posture  Rationale for Evaluation and Treatment: Rehabilitation  ONSET DATE: couple of months ago   SUBJECTIVE:  SUBJECTIVE STATEMENT: [name] reports pain is worse today, pain is shooting down   PERTINENT HISTORY:  T2DM, HTN, CAD, osteopenia  PAIN:  Are you having pain? Yes: NPRS scale: 6-7/10 Pain location: L upper shoulder Pain description: nagging, dull ache Aggravating factors: worst 8-9/10 at night Relieving factors: ibuprofen, gabapentin  PRECAUTIONS: None  WEIGHT BEARING RESTRICTIONS: No  FALLS:  Has patient fallen in last 6 months? No  LIVING ENVIRONMENT: Lives with: lives with their daughter Lives in: House/apartment Stairs: Yes: Internal: 12 steps; on right going up, on left going up, and can reach both and External: 3 steps; none Has following equipment at home: None  OCCUPATION: retired  PLOF: Independent and Leisure: volunteers at [employer] 3x/week (counseling, prayer meetings)  PATIENT GOALS: get rid of pain  NEXT MD VISIT: not scheduled  OBJECTIVE:    DIAGNOSTIC FINDINGS:  No relevant imaging  PATIENT SURVEYS:  NDI 14/50= 28% impairment  COGNITION: Overall cognitive status: Within functional limits for tasks assessed  SENSATION: WFL  POSTURE: rounded shoulders and forward head  PALPATION: Tenderness/tightness cervical paraspinals (L>R), L UT, L levator scapulae.    CERVICAL ROM:   Active ROM AROM (deg) eval  Flexion 45  Extension 10  Right lateral flexion 30  Left lateral flexion 25  Right rotation 42  Left rotation 50   (Blank rows = not tested)  UPPER EXTREMITY ROM:  Active ROM Right eval Left eval  Shoulder flexion 110 115  Shoulder extension 70 70  Shoulder abduction 115 125  Shoulder adduction 65 60  Shoulder extension    Shoulder internal rotation (functional) Small of back Small of back  Shoulder external rotation (functional) Scapular spine Scapular spine   (Blank rows = not tested)  UPPER EXTREMITY MMT:  MMT Right eval Left eval  Shoulder flexion 5 5  Shoulder extension    Shoulder abduction 5 5  Shoulder internal rotation 5 5  Shoulder external rotation 5 4+  Middle trapezius    Lower trapezius    Elbow flexion 5 5  Elbow extension 5 5  Wrist flexion 5 5  Wrist extension 5 5  Grip strength fair fair   (Blank rows = not tested)  CERVICAL SPECIAL TESTS:  Spurling's test: Negative  FUNCTIONAL TESTS:  NT  TODAY'S TREATMENT:                                                                                                                              DATE:   08/06/22 Therapeutic Exercise: to improve strength and mobility.  Demo, verbal and tactile cues throughout for technique. UBE x 4 min forward only  Review of HEP - focusing on gentle small movements- Retro shoulder rolls x 10, chin tucks x 10 (no hold, 75%),  scap squeezes x 10, levator stretch 2 x 5 sec hold.  Manual Therapy: to decrease muscle spasm and pain and improve mobility STM to cervical paraspinals, L UT, gentle pec  stretch.  Poor tolerance to all manual therapy even with very light touch.  Discontinued.  Modalities: Estim (premod) to L UT x 10 min with MHP to decrease muscle spasm and pain.   08/04/22 Therapeutic Exercise: to improve strength and mobility.  Demo, verbal and tactile cues throughout for technique.  Retro shoulder rolls x 10, chin tucks x 10, scap squeezes x 10, levator stretch 2 x 15 sec hold.   PATIENT EDUCATION:  Education details: review of HEP Person educated: Patient Education method: Explanation, Demonstration, and Verbal cues Education comprehension: verbalized understanding and returned demonstration  HOME EXERCISE PROGRAM: Access Code: [email or web address] URL: [email or web address] Date: 08/04/2022 Prepared by: Glenetta Hew  Exercises - Seated Shoulder Rolls  - 3 x daily - 7 x weekly - 1 sets - 10 reps - Seated Cervical Retraction  - 3 x daily - 7 x weekly - 1 sets - 10 reps - 3-5 sec  hold - Seated Scapular Retraction  - 3 x daily - 7 x weekly - 1 sets - 10 reps - 3-5 sec  hold - Gentle Levator Scapulae Stretch  - 1 x daily - 7 x weekly - 1 sets - 3 reps - 15 sec  hold  ASSESSMENT:  CLINICAL IMPRESSION: Montez Barrette reported increased pain and radicular symptoms today with exercises.  Reviewed HEP, focusing on very gentle movements, she did not report any pain with the exercises as performed.  Started manual therapy but she reported increased pain and poor tolerance even with very light touch so discontinued and placed on MHP and Estim to decrease muscle spasm and pain.  Reported decreased pain after interventions.  Hasini Yauger continues to demonstrate potential for improvement and would benefit from continued skilled therapy to address impairments.    OBJECTIVE IMPAIRMENTS: decreased ROM, decreased strength, hypomobility, increased fascial restrictions, increased muscle spasms, postural dysfunction, and pain.   ACTIVITY LIMITATIONS: carrying, lifting, bending,  and sleeping  PARTICIPATION LIMITATIONS: cleaning, laundry, driving, and community activity  PERSONAL FACTORS: Age, Time since onset of injury/illness/exacerbation, and 1-2 comorbidities: T2DM, osteopenia, CAD  are also affecting patient's functional outcome.   REHAB POTENTIAL: Excellent  CLINICAL DECISION MAKING: Stable/uncomplicated  EVALUATION COMPLEXITY: Low   GOALS: Goals reviewed with patient? Yes  SHORT TERM GOALS: Target date: 08/18/2022   Patient will be independent with initial HEP.  Baseline: given Goal status: IN PROGRESS   LONG TERM GOALS: Target date: 09/15/2022   Patient will be independent with advanced/ongoing HEP to improve outcomes and carryover.  Baseline:  Goal status: IN PROGRESS  2.  Patient will report 75% improvement in neck pain to improve QOL.  Baseline:  Goal status: IN PROGRESS  3.  Patient will demonstrate 10 deg improvement in cervical ROM all directions for safety with driving.  Baseline: see objective Goal status: IN PROGRESS  4.  Patient will report 15% on NDI to demonstrate improved functional ability.  Baseline: 28% impairment Goal status: IN PROGRESS  5.  Patient will demonstrate improved posture to decrease muscle imbalance. Baseline: forward head posture Goal status: IN PROGRESS  PLAN:  PT FREQUENCY: 1-2x/week  PT DURATION: 6 weeks  PLANNED INTERVENTIONS: Therapeutic exercises, Therapeutic activity, Neuromuscular re-education, Balance training, Gait training, Patient/Family education, Self Care, Joint mobilization, Dry Needling, Electrical stimulation, Spinal mobilization, Cryotherapy, Moist heat, Traction, Ultrasound, Manual therapy, and Re-evaluation  PLAN FOR NEXT SESSION: review and progress postural exercises, manual therapy, TrDN if willing.  Modalities PRN   Rennie Natter, PT, DPT  08/06/2022, 4:23 PM

## 2022-08-11 ENCOUNTER — Encounter: Payer: Self-pay | Admitting: Physical Therapy

## 2022-08-11 ENCOUNTER — Ambulatory Visit: Payer: Medicare PPO | Admitting: Physical Therapy

## 2022-08-11 DIAGNOSIS — R293 Abnormal posture: Secondary | ICD-10-CM | POA: Diagnosis not present

## 2022-08-11 DIAGNOSIS — M5412 Radiculopathy, cervical region: Secondary | ICD-10-CM

## 2022-08-11 DIAGNOSIS — M542 Cervicalgia: Secondary | ICD-10-CM

## 2022-08-11 DIAGNOSIS — R252 Cramp and spasm: Secondary | ICD-10-CM

## 2022-08-11 NOTE — Therapy (Signed)
OUTPATIENT PHYSICAL THERAPY Treatment   Patient Name: Morgan Medina MRN: [id number] DOB:1945/12/17, 77 y.o., female Today's Date: 08/11/2022  END OF SESSION:  PT End of Session - 08/11/22 1400     Visit Number 3    Number of Visits 12    Date for PT Re-Evaluation 09/15/22    Authorization Type [employer]    Authorization Time Period pending authorization.    Progress Note Due on Visit 10    PT Start Time 1400    PT Stop Time 1445    PT Time Calculation (min) 45 min    Activity Tolerance Patient tolerated treatment well    Behavior During Therapy WFL for tasks assessed/performed             Past Medical History:  Diagnosis Date   Anemia    Cerebral aneurysm    h/o in 2006   Dacrocystitis 07/19/2015   right   Diabetes mellitus type 2 in obese ([place]) 11/05/2014   GERD (gastroesophageal reflux disease) 04/17/2016   Hyperlipidemia    Hypertension    Osteoarthritis of left hip 12/02/2015   Osteopenia 11/22/2015   Overweight    Past Surgical History:  Procedure Laterality Date   ABDOMINAL HYSTERECTOMY     CEREBRAL ANEURYSM REPAIR     Patient Active Problem List   Diagnosis Date Noted   Cervical radiculopathy 05/07/2022   Cough 10/13/2021   Change in bowel habit 11/30/2020   Coronary artery disease involving native coronary artery of native heart without angina pectoris 11/19/2020   Grade I diastolic dysfunction 0000000   Shortness of breath 09/05/2020   DOE (dyspnea on exertion) 09/05/2020   Bilateral leg edema 09/05/2020   Nonspecific abnormal electrocardiogram (ECG) (EKG) 09/05/2020   Fatigue 09/05/2020   Bradycardia, sinus 09/05/2020   Type 2 diabetes mellitus with hyperosmolarity without coma, without long-term current use of insulin ([place]) 09/05/2020   High serum vitamin B12 08/20/2020   Allergies 11/09/2018   Dry eyes, bilateral 02/02/2018   Cataracts, bilateral 02/02/2018   Vitamin D deficiency 02/02/2018   Neck pain 12/23/2017   GERD (gastroesophageal reflux  disease) 04/17/2016   Osteoarthritis of left hip 12/02/2015   Preventative health care 11/22/2015   Osteopenia 11/22/2015   Dacrocystitis 07/19/2015   Left-sided chest wall pain 05/11/2015   Overweight    Herpes zoster  history of facial zoster 05/02/2014   Anemia 11/28/2013   HTN (hypertension) 11/28/2013   Hyperlipidemia 11/28/2013   S/P abdominal supracervical subtotal hysterectomy 11/28/2013   Colon polyps 11/28/2013   Cerebral aneurysm  S/P coiling  Morgan Medina  2006   11/28/2013   Menopause 11/28/2013    PCP: Mosie Lukes, MD   REFERRING PROVIDER: Rosemarie Ax, MD   REFERRING DIAG: [phone] (ICD-10-CM) - Cervical radiculopathy  THERAPY DIAG:  Radiculopathy, cervical region  Cervicalgia  Cramp and spasm  Abnormal posture  Rationale for Evaluation and Treatment: Rehabilitation  ONSET DATE: couple of months ago   SUBJECTIVE:  SUBJECTIVE STATEMENT: Morgan Medina reports that Weds night and Thurs were rough after therapy "it was a 2 gabapentin night", but Friday she felt good.  Has not tried her exercises.    PERTINENT HISTORY:  T2DM, HTN, CAD, osteopenia  PAIN:  Are you having pain? Yes: NPRS scale: 4-5/10 Pain location: L upper shoulder Pain description: nagging, dull ache Aggravating factors: worst 8-9/10 at night Relieving factors: ibuprofen, gabapentin  PRECAUTIONS: None  WEIGHT BEARING RESTRICTIONS: No  FALLS:  Has patient fallen in last 6 months? No  LIVING ENVIRONMENT: Lives with: lives with their daughter Lives in: House/apartment Stairs: Yes: Internal: 12 steps; on right going up, on left going up, and can reach both and External: 3 steps; none Has following equipment at home: None  OCCUPATION: retired  PLOF: Independent and Leisure:  volunteers at [employer] 3x/week (counseling, prayer meetings)  PATIENT GOALS: get rid of pain  NEXT MD VISIT: not scheduled  OBJECTIVE:   DIAGNOSTIC FINDINGS:  No relevant imaging  PATIENT SURVEYS:  NDI 14/50= 28% impairment  COGNITION: Overall cognitive status: Within functional limits for tasks assessed  SENSATION: WFL  POSTURE: rounded shoulders and forward head  PALPATION: Tenderness/tightness cervical paraspinals (L>R), L UT, L levator scapulae.    CERVICAL ROM:   Active ROM AROM (deg) eval  Flexion 45  Extension 10  Right lateral flexion 30  Left lateral flexion 25  Right rotation 42  Left rotation 50   (Blank rows = not tested)  UPPER EXTREMITY ROM:  Active ROM Right eval Left eval  Shoulder flexion 110 115  Shoulder extension 70 70  Shoulder abduction 115 125  Shoulder adduction 65 60  Shoulder extension    Shoulder internal rotation (functional) Small of back Small of back  Shoulder external rotation (functional) Scapular spine Scapular spine   (Blank rows = not tested)  UPPER EXTREMITY MMT:  MMT Right eval Left eval  Shoulder flexion 5 5  Shoulder extension    Shoulder abduction 5 5  Shoulder internal rotation 5 5  Shoulder external rotation 5 4+  Middle trapezius    Lower trapezius    Elbow flexion 5 5  Elbow extension 5 5  Wrist flexion 5 5  Wrist extension 5 5  Grip strength fair fair   (Blank rows = not tested)  CERVICAL SPECIAL TESTS:  Spurling's test: Negative  FUNCTIONAL TESTS:  NT  TODAY'S TREATMENT:                                                                                                                              DATE:  08/11/2022 Therapeutic Exercise: to improve strength and mobility.  Demo, verbal and tactile cues throughout for technique. UBE L1 x 4 min forward only   focusing on gentle small movements- Retro shoulder rolls x 10, chin tucks x 10 (no hold, 75%),  scap squeezes x 10, levator stretch 2 x 5 sec  hold.  Nerve glides Manual Therapy: to  decrease muscle spasm and pain and improve mobility STM/TPR to L UT, LS, anterior scalenes, cervical paraspinals and rhomboids in sitting Modalities: Estim (premod) to L UT x 13 min with MHP to decrease muscle spasm and pain.    08/06/22 Therapeutic Exercise: to improve strength and mobility.  Demo, verbal and tactile cues throughout for technique. UBE x 4 min forward only  Review of HEP - focusing on gentle small movements- Retro shoulder rolls x 10, chin tucks x 10 (no hold, 75%),  scap squeezes x 10, levator stretch 2 x 5 sec hold.  Manual Therapy: to decrease muscle spasm and pain and improve mobility STM to cervical paraspinals, L UT, gentle pec stretch.  Poor tolerance to all manual therapy even with very light touch.  Discontinued.  Modalities: Estim (premod) to L UT x 10 min with MHP to decrease muscle spasm and pain.   08/04/22 Therapeutic Exercise: to improve strength and mobility.  Demo, verbal and tactile cues throughout for technique.  Retro shoulder rolls x 10, chin tucks x 10, scap squeezes x 10, levator stretch 2 x 15 sec hold.   PATIENT EDUCATION:  Education details: review of HEP Person educated: Patient Education method: Explanation, Demonstration, and Verbal cues Education comprehension: verbalized understanding and returned demonstration  HOME EXERCISE PROGRAM: Access Code: [email or web address] URL: [email or web address] Date: 08/04/2022 Prepared by: Glenetta Hew  Exercises - Seated Shoulder Rolls  - 3 x daily - 7 x weekly - 1 sets - 10 reps - Seated Cervical Retraction  - 3 x daily - 7 x weekly - 1 sets - 10 reps - 3-5 sec  hold - Seated Scapular Retraction  - 3 x daily - 7 x weekly - 1 sets - 10 reps - 3-5 sec  hold - Gentle Levator Scapulae Stretch  - 1 x daily - 7 x weekly - 1 sets - 3 reps - 15 sec  hold  ASSESSMENT:  CLINICAL IMPRESSION: Morgan Medina reports increased pain after last session, but then  decreased pain starting on Friday.  Today she was able to tolerated exercises without increased pain, as well as nerve glides, and also able to tolerate manual therapy with less discomfort.  Still applied MHP and Estim at end of session as she felt this was very beneficial last session.  Reported decreased pain at end of session and "better than I felt last time." Encouraged to perform HEP small sets gently throughout the day to encourage gentle movement/mobility.  Morgan Medina continues to demonstrate potential for improvement and would benefit from continued skilled therapy to address impairments.    OBJECTIVE IMPAIRMENTS: decreased ROM, decreased strength, hypomobility, increased fascial restrictions, increased muscle spasms, postural dysfunction, and pain.   ACTIVITY LIMITATIONS: carrying, lifting, bending, and sleeping  PARTICIPATION LIMITATIONS: cleaning, laundry, driving, and community activity  PERSONAL FACTORS: Age, Time since onset of injury/illness/exacerbation, and 1-2 comorbidities: T2DM, osteopenia, CAD  are also affecting patient's functional outcome.   REHAB POTENTIAL: Excellent  CLINICAL DECISION MAKING: Stable/uncomplicated  EVALUATION COMPLEXITY: Low   GOALS: Goals reviewed with patient? Yes  SHORT TERM GOALS: Target date: 08/18/2022   Patient will be independent with initial HEP.  Baseline: given Goal status: IN PROGRESS   LONG TERM GOALS: Target date: 09/15/2022   Patient will be independent with advanced/ongoing HEP to improve outcomes and carryover.  Baseline:  Goal status: IN PROGRESS  2.  Patient will report 75% improvement in neck pain to improve QOL.  Baseline:  Goal status: IN PROGRESS  3.  Patient  will demonstrate 10 deg improvement in cervical ROM all directions for safety with driving.  Baseline: see objective Goal status: IN PROGRESS  4.  Patient will report 15% on NDI to demonstrate improved functional ability.  Baseline: 28% impairment Goal  status: IN PROGRESS  5.  Patient will demonstrate improved posture to decrease muscle imbalance. Baseline: forward head posture Goal status: IN PROGRESS  PLAN:  PT FREQUENCY: 1-2x/week  PT DURATION: 6 weeks  PLANNED INTERVENTIONS: Therapeutic exercises, Therapeutic activity, Neuromuscular re-education, Balance training, Gait training, Patient/Family education, Self Care, Joint mobilization, Dry Needling, Electrical stimulation, Spinal mobilization, Cryotherapy, Moist heat, Traction, Ultrasound, Manual therapy, and Re-evaluation  PLAN FOR NEXT SESSION: review and progress postural exercises, manual therapy, TrDN if willing.  Modalities PRN   Rennie Natter, PT, DPT  08/11/2022, 2:53 PM

## 2022-08-14 ENCOUNTER — Ambulatory Visit: Payer: Medicare PPO

## 2022-08-14 DIAGNOSIS — M542 Cervicalgia: Secondary | ICD-10-CM

## 2022-08-14 DIAGNOSIS — R252 Cramp and spasm: Secondary | ICD-10-CM

## 2022-08-14 DIAGNOSIS — R293 Abnormal posture: Secondary | ICD-10-CM

## 2022-08-14 DIAGNOSIS — M5412 Radiculopathy, cervical region: Secondary | ICD-10-CM | POA: Diagnosis not present

## 2022-08-14 NOTE — Therapy (Signed)
OUTPATIENT PHYSICAL THERAPY Treatment   Patient Name: Coralie Nitti MRN: [id number] DOB:[birth year], 77 y.o., female Today's Date: 08/14/2022  END OF SESSION:  PT End of Session - 08/14/22 1740     Visit Number 4    Number of Visits 12    Date for PT Re-Evaluation 09/15/22    Authorization Type [employer]    Authorization Time Period 2/21-4/1    Progress Note Due on Visit 10    PT Start Time 1617    PT Stop Time 1708    PT Time Calculation (min) 51 min    Activity Tolerance Patient tolerated treatment well    Behavior During Therapy [facility] for tasks assessed/performed             Past Medical History:  Diagnosis Date   Anemia    Cerebral aneurysm    h/o in 2006   Dacrocystitis 07/19/2015   right   Diabetes mellitus type 2 in obese ([place]) 11/05/2014   GERD (gastroesophageal reflux disease) 04/17/2016   Hyperlipidemia    Hypertension    Osteoarthritis of left hip 12/02/2015   Osteopenia 11/22/2015   Overweight    Past Surgical History:  Procedure Laterality Date   ABDOMINAL HYSTERECTOMY     CEREBRAL ANEURYSM REPAIR     Patient Active Problem List   Diagnosis Date Noted   Cervical radiculopathy 05/07/2022   Cough 10/13/2021   Change in bowel habit 11/30/2020   Coronary artery disease involving native coronary artery of native heart without angina pectoris 11/19/2020   Grade I diastolic dysfunction 0000000   Shortness of breath 09/05/2020   DOE (dyspnea on exertion) 09/05/2020   Bilateral leg edema 09/05/2020   Nonspecific abnormal electrocardiogram (ECG) (EKG) 09/05/2020   Fatigue 09/05/2020   Bradycardia, sinus 09/05/2020   Type 2 diabetes mellitus with hyperosmolarity without coma, without long-term current use of insulin ([place]) 09/05/2020   High serum vitamin B12 08/20/2020   Allergies 11/09/2018   Dry eyes, bilateral 02/02/2018   Cataracts, bilateral 02/02/2018   Vitamin D deficiency 02/02/2018   Neck pain 12/23/2017   GERD (gastroesophageal reflux disease)  04/17/2016   Osteoarthritis of left hip 12/02/2015   Preventative health care 11/22/2015   Osteopenia 11/22/2015   Dacrocystitis 07/19/2015   Left-sided chest wall pain 05/11/2015   Overweight    Herpes zoster  history of facial zoster 05/02/2014   Anemia 11/28/2013   HTN (hypertension) 11/28/2013   Hyperlipidemia 11/28/2013   S/P abdominal supracervical subtotal hysterectomy 11/28/2013   Colon polyps 11/28/2013   Cerebral aneurysm  S/P coiling  Mikel Cella  2006   11/28/2013   Menopause 11/28/2013    PCP: Mosie Lukes, MD   REFERRING PROVIDER: Rosemarie Ax, MD   REFERRING DIAG: [phone] (ICD-10-CM) - Cervical radiculopathy  THERAPY DIAG:  Radiculopathy, cervical region  Cervicalgia  Cramp and spasm  Abnormal posture  Rationale for Evaluation and Treatment: Rehabilitation  ONSET DATE: couple of months ago   SUBJECTIVE:  SUBJECTIVE STATEMENT: Pt reports good compliance with exercises.  PERTINENT HISTORY:  T2DM, HTN, CAD, osteopenia  PAIN:  Are you having pain? Yes: NPRS scale: 4-5/10 Pain location: L upper shoulder Pain description: nagging, dull ache Aggravating factors: worst 8-9/10 at night Relieving factors: ibuprofen, gabapentin  PRECAUTIONS: None  WEIGHT BEARING RESTRICTIONS: No  FALLS:  Has patient fallen in last 6 months? No  LIVING ENVIRONMENT: Lives with: lives with their daughter Lives in: House/apartment Stairs: Yes: Internal: 12 steps; on right going up, on left going up, and can reach both and External: 3 steps; none Has following equipment at home: None  OCCUPATION: retired  PLOF: Independent and Leisure: volunteers at [employer] 3x/week (counseling, prayer meetings)  PATIENT GOALS: get rid of pain  NEXT MD VISIT: not  scheduled  OBJECTIVE:   DIAGNOSTIC FINDINGS:  No relevant imaging  PATIENT SURVEYS:  NDI 14/50= 28% impairment  COGNITION: Overall cognitive status: Within functional limits for tasks assessed  SENSATION: WFL  POSTURE: rounded shoulders and forward head  PALPATION: Tenderness/tightness cervical paraspinals (L>R), L UT, L levator scapulae.    CERVICAL ROM:   Active ROM AROM (deg) eval  Flexion 45  Extension 10  Right lateral flexion 30  Left lateral flexion 25  Right rotation 42  Left rotation 50   (Blank rows = not tested)  UPPER EXTREMITY ROM:  Active ROM Right eval Left eval  Shoulder flexion 110 115  Shoulder extension 70 70  Shoulder abduction 115 125  Shoulder adduction 65 60  Shoulder extension    Shoulder internal rotation (functional) Small of back Small of back  Shoulder external rotation (functional) Scapular spine Scapular spine   (Blank rows = not tested)  UPPER EXTREMITY MMT:  MMT Right eval Left eval  Shoulder flexion 5 5  Shoulder extension    Shoulder abduction 5 5  Shoulder internal rotation 5 5  Shoulder external rotation 5 4+  Middle trapezius    Lower trapezius    Elbow flexion 5 5  Elbow extension 5 5  Wrist flexion 5 5  Wrist extension 5 5  Grip strength fair fair   (Blank rows = not tested)  CERVICAL SPECIAL TESTS:  Spurling's test: Negative  FUNCTIONAL TESTS:  NT  TODAY'S TREATMENT:                                                                                                                              DATE:  08/14/22 Therapeutic Exercise: to improve strength and mobility.  Demo, verbal and tactile cues throughout for technique. UBE L1 x 5 min forward only chin tucks 2x10 Shoulder squeeze 2x10 LS stretch 3x15"  Manual Therapy: to decrease muscle spasm and pain and improve mobility STM/TPR to L UT, LS, anterior scalenes, cervical paraspinals and rhomboids in sitting Modalities: Estim (IFC) to B upper  shoulders/neck x 12 min with MHP to decrease muscle spasm and pain.  08/11/2022 Therapeutic Exercise: to improve strength and mobility.  Demo, verbal and tactile cues throughout for technique. UBE L1 x 4 min forward only   focusing on gentle small movements- Retro shoulder rolls x 10, chin tucks x 10 (no hold, 75%),  scap squeezes x 10, levator stretch 2 x 5 sec hold.  Nerve glides Manual Therapy: to decrease muscle spasm and pain and improve mobility STM/TPR to L UT, LS, anterior scalenes, cervical paraspinals and rhomboids in sitting Modalities: Estim (premod) to L UT x 13 min with MHP to decrease muscle spasm and pain.    08/06/22 Therapeutic Exercise: to improve strength and mobility.  Demo, verbal and tactile cues throughout for technique. UBE x 4 min forward only  Review of HEP - focusing on gentle small movements- Retro shoulder rolls x 10, chin tucks x 10 (no hold, 75%),  scap squeezes x 10, levator stretch 2 x 5 sec hold.  Manual Therapy: to decrease muscle spasm and pain and improve mobility STM to cervical paraspinals, L UT, gentle pec stretch.  Poor tolerance to all manual therapy even with very light touch.  Discontinued.  Modalities: Estim (premod) to L UT x 10 min with MHP to decrease muscle spasm and pain.   08/04/22 Therapeutic Exercise: to improve strength and mobility.  Demo, verbal and tactile cues throughout for technique.  Retro shoulder rolls x 10, chin tucks x 10, scap squeezes x 10, levator stretch 2 x 15 sec hold.   PATIENT EDUCATION:  Education details: review of HEP Person educated: Patient Education method: Explanation, Demonstration, and Verbal cues Education comprehension: verbalized understanding and returned demonstration  HOME EXERCISE PROGRAM: Access Code: [email or web address] URL: [email or web address] Date: 08/04/2022 Prepared by: Glenetta Hew  Exercises - Seated Shoulder Rolls  - 3 x daily - 7 x weekly - 1 sets - 10 reps - Seated Cervical  Retraction  - 3 x daily - 7 x weekly - 1 sets - 10 reps - 3-5 sec  hold - Seated Scapular Retraction  - 3 x daily - 7 x weekly - 1 sets - 10 reps - 3-5 sec  hold - Gentle Levator Scapulae Stretch  - 1 x daily - 7 x weekly - 1 sets - 3 reps - 15 sec  hold  ASSESSMENT:  CLINICAL IMPRESSION: Pt noted no changes in pain coming in. She continued to report tightness and pain pointing to L UT/LS area. Cues required to keep shoulders down with scap squeezes to avoid UT over activation. Lots of tension noted in both UT, LS, and rhomboids but improvement after MT. Concluded session with estim and moist heat to address pain.  OBJECTIVE IMPAIRMENTS: decreased ROM, decreased strength, hypomobility, increased fascial restrictions, increased muscle spasms, postural dysfunction, and pain.   ACTIVITY LIMITATIONS: carrying, lifting, bending, and sleeping  PARTICIPATION LIMITATIONS: cleaning, laundry, driving, and community activity  PERSONAL FACTORS: Age, Time since onset of injury/illness/exacerbation, and 1-2 comorbidities: T2DM, osteopenia, CAD  are also affecting patient's functional outcome.   REHAB POTENTIAL: Excellent  CLINICAL DECISION MAKING: Stable/uncomplicated  EVALUATION COMPLEXITY: Low   GOALS: Goals reviewed with patient? Yes  SHORT TERM GOALS: Target date: 08/18/2022   Patient will be independent with initial HEP.  Baseline: given Goal status: IN PROGRESS   LONG TERM GOALS: Target date: 09/15/2022   Patient will be independent with advanced/ongoing HEP to improve outcomes and carryover.  Baseline:  Goal status: IN PROGRESS  2.  Patient will report 75% improvement in neck pain to improve QOL.  Baseline:  Goal status: IN PROGRESS  3.  Patient  will demonstrate 10 deg improvement in cervical ROM all directions for safety with driving.  Baseline: see objective Goal status: IN PROGRESS  4.  Patient will report 15% on NDI to demonstrate improved functional ability.  Baseline: 28%  impairment Goal status: IN PROGRESS  5.  Patient will demonstrate improved posture to decrease muscle imbalance. Baseline: forward head posture Goal status: IN PROGRESS  PLAN:  PT FREQUENCY: 1-2x/week  PT DURATION: 6 weeks  PLANNED INTERVENTIONS: Therapeutic exercises, Therapeutic activity, Neuromuscular re-education, Balance training, Gait training, Patient/Family education, Self Care, Joint mobilization, Dry Needling, Electrical stimulation, Spinal mobilization, Cryotherapy, Moist heat, Traction, Ultrasound, Manual therapy, and Re-evaluation  PLAN FOR NEXT SESSION: review and progress postural exercises, manual therapy, TrDN if willing.  Modalities PRN   Artist Pais, PTA 08/14/2022, 5:40 PM

## 2022-08-17 IMAGING — CT CT HEART MORP W/ CTA COR W/ SCORE W/ CA W/CM &/OR W/O CM
4 of 7 series · 8 of 20 positions shown, 9 images · non-contrast
Comparison: None.
COMPARISON: None.

Addendum:
EXAM:
OVER-READ INTERPRETATION  CT CHEST

The following report is an over-read performed by radiologist Dr.
over-read does not include interpretation of cardiac or coronary
anatomy or pathology. The coronary CTA interpretation by the
cardiologist is attached.
CLINICAL DATA: Chest pain
Cardiac CTA
MEDICATIONS:
Sub lingual nitro. 4mg x 2
TECHNIQUE: The patient was scanned on a Siemens [REDACTED]ice scanner. Gantry
rotation speed was 250 msecs. Collimation was 0.6 mm. A 100 kV
prospective scan was triggered in the ascending thoracic aorta at
35-75% of the R-R interval. Average HR during the scan was 60 bpm.
The 3D data set was interpreted on a dedicated work station using
MPR, MIP and VRT modes. A total of 80cc of contrast was used.

[Series 8: best diast 72 % · axial · 0.39mm/px · z∈[+1277,+1316]mm · 2 of 291 slices shown]
[im 97/291  vessel]
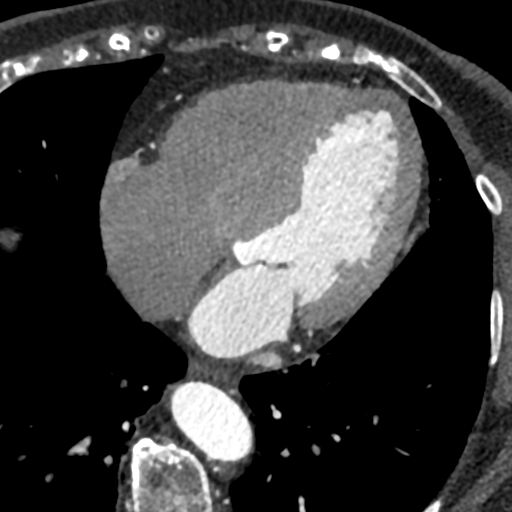
[im 194/291  vessel]
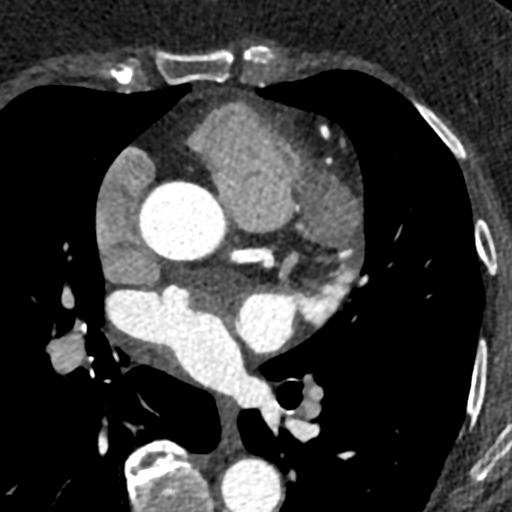

[Series 9: best syst · axial · 0.39mm/px · z∈[+1277,+1316]mm · 2 of 291 slices shown, 3 images]
[im 97/291  vessel]
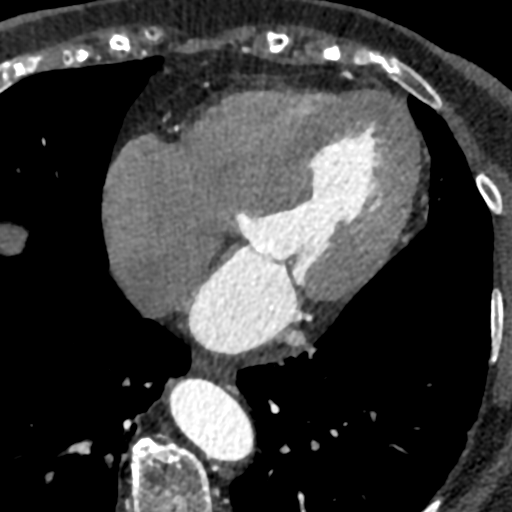
[im 97/291  lung]
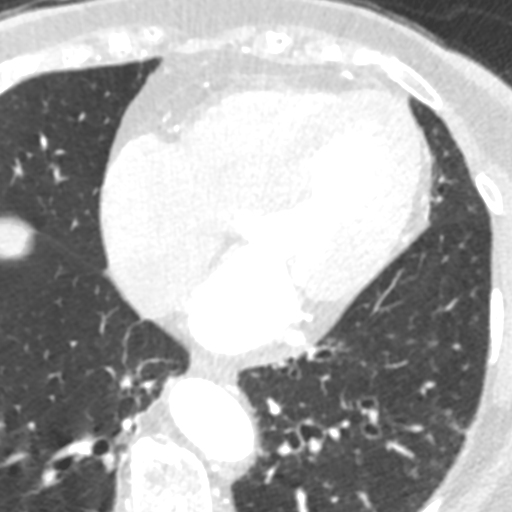
[im 194/291  vessel]
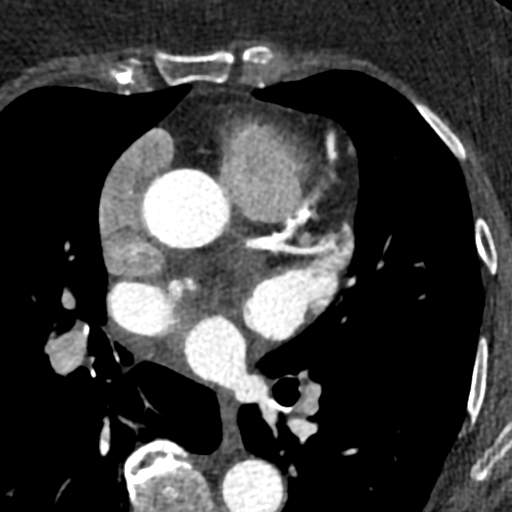

[Series 10: ts diast sharp 72 % · axial · 0.39mm/px · z∈[+1277,+1316]mm · 2 of 291 slices shown]
[im 97/291  lung]
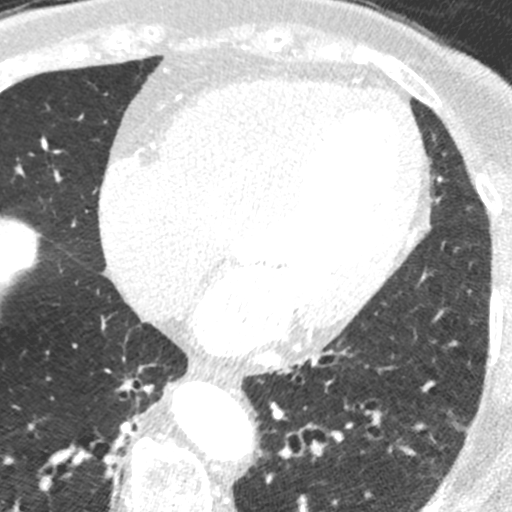
[im 194/291  lung]
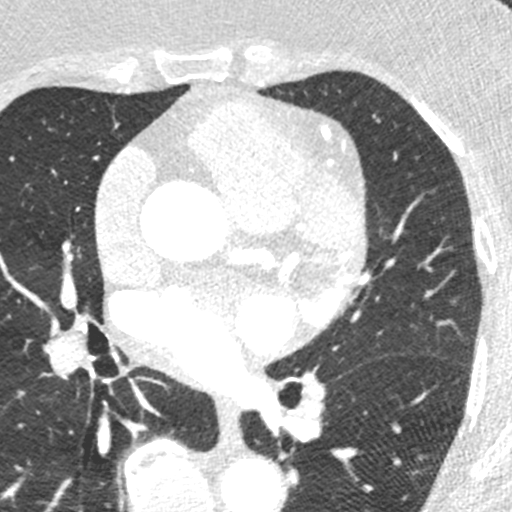

[Series 11: ts syst sharp · axial · 0.39mm/px · z∈[+1277,+1316]mm · 2 of 291 slices shown]
[im 97/291  lung]
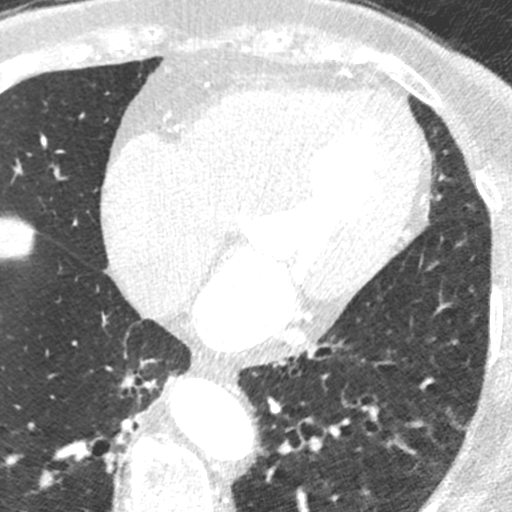
[im 194/291  lung]
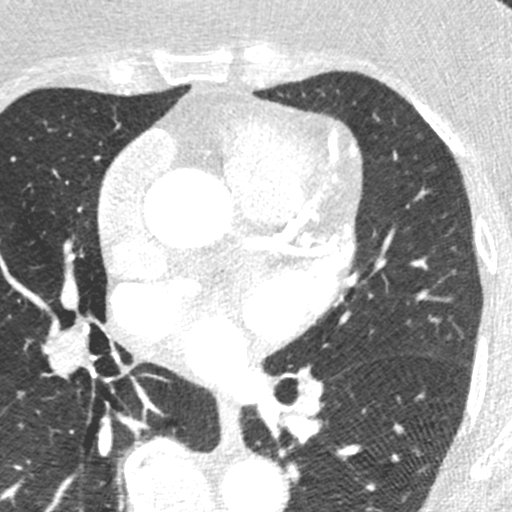

[8 of 20 positions shown; findings below may reference images not displayed]

FINDINGS: Vascular: Central pulmonary artery dilatation with the main
pulmonary artery measuring up to 3.3 cm.

Mediastinum/Nodes: Visualized mediastinum and hilar regions
demonstrate no lymphadenopathy or masses.

Lungs/Pleura: Visualized lungs show no evidence of pulmonary edema,
consolidation, pneumothorax, nodule or pleural fluid.

Upper Abdomen: No acute abnormality.

Musculoskeletal: No chest wall mass or suspicious bone lesions
identified.
IMPRESSION: Central pulmonary artery dilatation with the main pulmonary artery
measuring up to 3.3 cm. This may implicate some degree of underlying
pulmonary hypertension.
FINDINGS: Non-cardiac: See separate report from [REDACTED].

Pulmonary veins drained normally to the left atrium. No LA appendage
thrombus.

Calcium Score: 208 Agatston units.

Coronary Arteries: Right dominant with no anomalies

LM: Mixed plaque distal left main, no significant stenosis.

LAD system: Mixed plaque ostial and proximal LAD, mild (<50%)
stenosis. There was a small area of myocardial bridging in the mid
LAD.

Circumflex system: Moderate ramus, calcified plaque mid ramus with
mild (<50%) stenosis. No plaque or stenosis in the AV LCx.

RCA system: No plaque or stenosis.
IMPRESSION: 1. Coronary artery calcium score 208 Agatston units. This places the
patient in the 76th percentile for age and gender, suggesting
intermediate to high risk for future cardiac events.

2.  Nonobstructive CAD.

Home [facility]

*** End of Addendum ***
EXAM:
OVER-READ INTERPRETATION  CT CHEST

The following report is an over-read performed by radiologist Dr.
over-read does not include interpretation of cardiac or coronary
anatomy or pathology. The coronary CTA interpretation by the
cardiologist is attached.
FINDINGS: Vascular: Central pulmonary artery dilatation with the main
pulmonary artery measuring up to 3.3 cm.

Mediastinum/Nodes: Visualized mediastinum and hilar regions
demonstrate no lymphadenopathy or masses.

Lungs/Pleura: Visualized lungs show no evidence of pulmonary edema,
consolidation, pneumothorax, nodule or pleural fluid.

Upper Abdomen: No acute abnormality.

Musculoskeletal: No chest wall mass or suspicious bone lesions
identified.
IMPRESSION: Central pulmonary artery dilatation with the main pulmonary artery
measuring up to 3.3 cm. This may implicate some degree of underlying
pulmonary hypertension.

## 2022-08-18 ENCOUNTER — Ambulatory Visit: Payer: Medicare PPO | Attending: Family Medicine | Admitting: Physical Therapy

## 2022-08-18 ENCOUNTER — Encounter: Payer: Self-pay | Admitting: Physical Therapy

## 2022-08-18 DIAGNOSIS — R293 Abnormal posture: Secondary | ICD-10-CM | POA: Diagnosis not present

## 2022-08-18 DIAGNOSIS — R252 Cramp and spasm: Secondary | ICD-10-CM | POA: Diagnosis not present

## 2022-08-18 DIAGNOSIS — M542 Cervicalgia: Secondary | ICD-10-CM | POA: Diagnosis not present

## 2022-08-18 DIAGNOSIS — M5412 Radiculopathy, cervical region: Secondary | ICD-10-CM | POA: Insufficient documentation

## 2022-08-18 NOTE — Therapy (Signed)
OUTPATIENT PHYSICAL THERAPY Treatment   Patient Name: Shenell Leith MRN: [id number] DOB:1946-03-12, 77 y.o., female Today's Date: 08/18/2022  END OF SESSION:  PT End of Session - 08/18/22 1024     Visit Number 5    Number of Visits 12    Date for PT Re-Evaluation 09/15/22    Authorization Type [employer]    Authorization Time Period 2/21-4/1    Progress Note Due on Visit 10    PT Start Time 1022    PT Stop Time 1110    PT Time Calculation (min) 48 min    Activity Tolerance Patient tolerated treatment well    Behavior During Therapy [facility] for tasks assessed/performed             Past Medical History:  Diagnosis Date   Anemia    Cerebral aneurysm    h/o in 2006   Dacrocystitis 07/19/2015   right   Diabetes mellitus type 2 in obese ([place]) 11/05/2014   GERD (gastroesophageal reflux disease) 04/17/2016   Hyperlipidemia    Hypertension    Osteoarthritis of left hip 12/02/2015   Osteopenia 11/22/2015   Overweight    Past Surgical History:  Procedure Laterality Date   ABDOMINAL HYSTERECTOMY     CEREBRAL ANEURYSM REPAIR     Patient Active Problem List   Diagnosis Date Noted   Cervical radiculopathy 05/07/2022   Cough 10/13/2021   Change in bowel habit 11/30/2020   Coronary artery disease involving native coronary artery of native heart without angina pectoris 11/19/2020   Grade I diastolic dysfunction 0000000   Shortness of breath 09/05/2020   DOE (dyspnea on exertion) 09/05/2020   Bilateral leg edema 09/05/2020   Nonspecific abnormal electrocardiogram (ECG) (EKG) 09/05/2020   Fatigue 09/05/2020   Bradycardia, sinus 09/05/2020   Type 2 diabetes mellitus with hyperosmolarity without coma, without long-term current use of insulin ([place]) 09/05/2020   High serum vitamin B12 08/20/2020   Allergies 11/09/2018   Dry eyes, bilateral 02/02/2018   Cataracts, bilateral 02/02/2018   Vitamin D deficiency 02/02/2018   Neck pain 12/23/2017   GERD (gastroesophageal reflux disease)  04/17/2016   Osteoarthritis of left hip 12/02/2015   Preventative health care 11/22/2015   Osteopenia 11/22/2015   Dacrocystitis 07/19/2015   Left-sided chest wall pain 05/11/2015   Overweight    Herpes zoster  history of facial zoster 05/02/2014   Anemia 11/28/2013   HTN (hypertension) 11/28/2013   Hyperlipidemia 11/28/2013   S/P abdominal supracervical subtotal hysterectomy 11/28/2013   Colon polyps 11/28/2013   Cerebral aneurysm  S/P coiling  Mikel Cella  2006   11/28/2013   Menopause 11/28/2013    PCP: Mosie Lukes, MD   REFERRING PROVIDER: Rosemarie Ax, MD   REFERRING DIAG: [phone] (ICD-10-CM) - Cervical radiculopathy  THERAPY DIAG:  Radiculopathy, cervical region  Cervicalgia  Cramp and spasm  Abnormal posture  Rationale for Evaluation and Treatment: Rehabilitation  ONSET DATE: couple of months ago   SUBJECTIVE:  SUBJECTIVE STATEMENT: Pt reports having more pain this morning which is usually her good time.   Using heating pad at night.    PERTINENT HISTORY:  T2DM, HTN, CAD, osteopenia  PAIN:  Are you having pain? Yes: NPRS scale: 4-5/10 Pain location: L upper shoulder Pain description: nagging, dull ache Aggravating factors: worst 8-9/10 at night Relieving factors: ibuprofen, gabapentin  PRECAUTIONS: None  WEIGHT BEARING RESTRICTIONS: No  FALLS:  Has patient fallen in last 6 months? No  LIVING ENVIRONMENT: Lives with: lives with their daughter Lives in: House/apartment Stairs: Yes: Internal: 12 steps; on right going up, on left going up, and can reach both and External: 3 steps; none Has following equipment at home: None  OCCUPATION: retired  PLOF: Independent and Leisure: volunteers at [employer] 3x/week (counseling, prayer meetings)  PATIENT  GOALS: get rid of pain  NEXT MD VISIT: not scheduled  OBJECTIVE:   DIAGNOSTIC FINDINGS:  No relevant imaging  PATIENT SURVEYS:  NDI 14/50= 28% impairment  COGNITION: Overall cognitive status: Within functional limits for tasks assessed  SENSATION: WFL  POSTURE: rounded shoulders and forward head  PALPATION: Tenderness/tightness cervical paraspinals (L>R), L UT, L levator scapulae.    CERVICAL ROM:   Active ROM AROM (deg) eval  Flexion 45  Extension 10  Right lateral flexion 30  Left lateral flexion 25  Right rotation 42  Left rotation 50   (Blank rows = not tested)  UPPER EXTREMITY ROM:  Active ROM Right eval Left eval  Shoulder flexion 110 115  Shoulder extension 70 70  Shoulder abduction 115 125  Shoulder adduction 65 60  Shoulder extension    Shoulder internal rotation (functional) Small of back Small of back  Shoulder external rotation (functional) Scapular spine Scapular spine   (Blank rows = not tested)  UPPER EXTREMITY MMT:  MMT Right eval Left eval  Shoulder flexion 5 5  Shoulder extension    Shoulder abduction 5 5  Shoulder internal rotation 5 5  Shoulder external rotation 5 4+  Middle trapezius    Lower trapezius    Elbow flexion 5 5  Elbow extension 5 5  Wrist flexion 5 5  Wrist extension 5 5  Grip strength fair fair   (Blank rows = not tested)  CERVICAL SPECIAL TESTS:  Spurling's test: Negative  FUNCTIONAL TESTS:  NT  TODAY'S TREATMENT:                                                                                                                              DATE:   08/18/22 Therapeutic Exercise: to improve strength and mobility.  Demo, verbal and tactile cues throughout for technique. UBE L1 x 5 min forward only Self snags with pillow case - extension x 10, rotation x 10 bil  SCM stretch with skin lock - increased parasthesia in arm so stopped Nerve glides- median and ulnar Manual Therapy: to decrease muscle spasm and pain  and improve mobility STM/TPR to L  UT, LS, anterior scalenes, cervical paraspinals and rhomboids in sitting, gentle pec stretch bil  Modalities: Estim (premod) to L UT x 13 min with MHP to decrease muscle spasm and pain.    08/14/22 Therapeutic Exercise: to improve strength and mobility.  Demo, verbal and tactile cues throughout for technique. UBE L1 x 5 min forward only chin tucks 2x10 Shoulder squeeze 2x10 LS stretch 3x15"  Manual Therapy: to decrease muscle spasm and pain and improve mobility STM/TPR to L UT, LS, anterior scalenes, cervical paraspinals and rhomboids in sitting Modalities: Estim (IFC) to B upper shoulders/neck x 12 min with MHP to decrease muscle spasm and pain.  08/11/2022 Therapeutic Exercise: to improve strength and mobility.  Demo, verbal and tactile cues throughout for technique. UBE L1 x 4 min forward only   focusing on gentle small movements- Retro shoulder rolls x 10, chin tucks x 10 (no hold, 75%),  scap squeezes x 10, levator stretch 2 x 5 sec hold.  Nerve glides Manual Therapy: to decrease muscle spasm and pain and improve mobility STM/TPR to L UT, LS, anterior scalenes, cervical paraspinals and rhomboids in sitting Modalities: Estim (premod) to L UT x 13 min with MHP to decrease muscle spasm and pain.    08/06/22 Therapeutic Exercise: to improve strength and mobility.  Demo, verbal and tactile cues throughout for technique. UBE x 4 min forward only  Review of HEP - focusing on gentle small movements- Retro shoulder rolls x 10, chin tucks x 10 (no hold, 75%),  scap squeezes x 10, levator stretch 2 x 5 sec hold.  Manual Therapy: to decrease muscle spasm and pain and improve mobility STM to cervical paraspinals, L UT, gentle pec stretch.  Poor tolerance to all manual therapy even with very light touch.  Discontinued.  Modalities: Estim (premod) to L UT x 10 min with MHP to decrease muscle spasm and pain.    PATIENT EDUCATION:  Education details: review of  HEP Person educated: Patient Education method: Explanation, Demonstration, and Verbal cues Education comprehension: verbalized understanding and returned demonstration  HOME EXERCISE PROGRAM: Access Code: [email or web address] URL: [email or web address] Date: 08/18/2022 Prepared by: Glenetta Hew  Exercises - Seated Shoulder Rolls  - 3 x daily - 7 x weekly - 1 sets - 10 reps - Seated Cervical Retraction  - 3 x daily - 7 x weekly - 1 sets - 10 reps - 3-5 sec  hold - Seated Scapular Retraction  - 3 x daily - 7 x weekly - 1 sets - 10 reps - 3-5 sec  hold - Gentle Levator Scapulae Stretch  - 1 x daily - 7 x weekly - 1 sets - 3 reps - 15 sec  hold - Cervical Extension AROM with Strap  - 2 x daily - 7 x weekly - 1 sets - 10 reps - Seated Assisted Cervical Rotation with Towel  - 2 x daily - 7 x weekly - 1 sets - 10 reps - Standing Median Nerve Glide  - 2 x daily - 7 x weekly - 1 sets - 10 reps  ASSESSMENT:  CLINICAL IMPRESSION: Nadiya Helget reports no significant improvement but has only had 1 episode of parasthesias down arm this weekend.  She tolerated the self snags with pillow case very well and reported decreased tightness afterwards, updated HEP with these exercises as well as median nerve glide, followed by manual therapy and modalities to decrease muscle spasm.  No pain reported after interventions.  Jet Elizardo continues to demonstrate potential for improvement  and would benefit from continued skilled therapy to address impairments.     OBJECTIVE IMPAIRMENTS: decreased ROM, decreased strength, hypomobility, increased fascial restrictions, increased muscle spasms, postural dysfunction, and pain.   ACTIVITY LIMITATIONS: carrying, lifting, bending, and sleeping  PARTICIPATION LIMITATIONS: cleaning, laundry, driving, and community activity  PERSONAL FACTORS: Age, Time since onset of injury/illness/exacerbation, and 1-2 comorbidities: T2DM, osteopenia, CAD  are also affecting  patient's functional outcome.   REHAB POTENTIAL: Excellent  CLINICAL DECISION MAKING: Stable/uncomplicated  EVALUATION COMPLEXITY: Low   GOALS: Goals reviewed with patient? Yes  SHORT TERM GOALS: Target date: 08/18/2022   Patient will be independent with initial HEP.  Baseline: given Goal status: IN PROGRESS   LONG TERM GOALS: Target date: 09/15/2022   Patient will be independent with advanced/ongoing HEP to improve outcomes and carryover.  Baseline:  Goal status: IN PROGRESS  2.  Patient will report 75% improvement in neck pain to improve QOL.  Baseline:  Goal status: IN PROGRESS  3.  Patient will demonstrate 10 deg improvement in cervical ROM all directions for safety with driving.  Baseline: see objective Goal status: IN PROGRESS  4.  Patient will report 15% on NDI to demonstrate improved functional ability.  Baseline: 28% impairment Goal status: IN PROGRESS  5.  Patient will demonstrate improved posture to decrease muscle imbalance. Baseline: forward head posture Goal status: IN PROGRESS  PLAN:  PT FREQUENCY: 1-2x/week  PT DURATION: 6 weeks  PLANNED INTERVENTIONS: Therapeutic exercises, Therapeutic activity, Neuromuscular re-education, Balance training, Gait training, Patient/Family education, Self Care, Joint mobilization, Dry Needling, Electrical stimulation, Spinal mobilization, Cryotherapy, Moist heat, Traction, Ultrasound, Manual therapy, and Re-evaluation  PLAN FOR NEXT SESSION: review and progress postural exercises, manual therapy, TrDN if willing.  Modalities PRN   Rennie Natter, PT, DPT  08/18/2022, 11:57 AM

## 2022-08-21 ENCOUNTER — Other Ambulatory Visit: Payer: Self-pay

## 2022-08-21 ENCOUNTER — Ambulatory Visit: Payer: Medicare PPO

## 2022-08-21 DIAGNOSIS — M5412 Radiculopathy, cervical region: Secondary | ICD-10-CM | POA: Diagnosis not present

## 2022-08-21 DIAGNOSIS — M542 Cervicalgia: Secondary | ICD-10-CM | POA: Diagnosis not present

## 2022-08-21 DIAGNOSIS — R252 Cramp and spasm: Secondary | ICD-10-CM | POA: Diagnosis not present

## 2022-08-21 DIAGNOSIS — R293 Abnormal posture: Secondary | ICD-10-CM

## 2022-08-21 NOTE — Therapy (Signed)
OUTPATIENT PHYSICAL THERAPY Treatment   Patient Name: Morgan Medina MRN: [id number] DOB:1945/10/06, 77 y.o., female Today's Date: 08/21/2022  END OF SESSION:  PT End of Session - 08/21/22 1658     Visit Number 6    Number of Visits 12    Date for PT Re-Evaluation 09/15/22    Authorization Type [employer]    Authorization Time Period 2/21-4/1    Progress Note Due on Visit 10    PT Start Time 1617    PT Stop Time 1708    PT Time Calculation (min) 51 min    Activity Tolerance Patient tolerated treatment well    Behavior During Therapy [facility] for tasks assessed/performed              Past Medical History:  Diagnosis Date   Anemia    Cerebral aneurysm    h/o in 2006   Dacrocystitis 07/19/2015   right   Diabetes mellitus type 2 in obese ([place]) 11/05/2014   GERD (gastroesophageal reflux disease) 04/17/2016   Hyperlipidemia    Hypertension    Osteoarthritis of left hip 12/02/2015   Osteopenia 11/22/2015   Overweight    Past Surgical History:  Procedure Laterality Date   ABDOMINAL HYSTERECTOMY     CEREBRAL ANEURYSM REPAIR     Patient Active Problem List   Diagnosis Date Noted   Cervical radiculopathy 05/07/2022   Cough 10/13/2021   Change in bowel habit 11/30/2020   Coronary artery disease involving native coronary artery of native heart without angina pectoris 11/19/2020   Grade I diastolic dysfunction 0000000   Shortness of breath 09/05/2020   DOE (dyspnea on exertion) 09/05/2020   Bilateral leg edema 09/05/2020   Nonspecific abnormal electrocardiogram (ECG) (EKG) 09/05/2020   Fatigue 09/05/2020   Bradycardia, sinus 09/05/2020   Type 2 diabetes mellitus with hyperosmolarity without coma, without long-term current use of insulin ([place]) 09/05/2020   High serum vitamin B12 08/20/2020   Allergies 11/09/2018   Dry eyes, bilateral 02/02/2018   Cataracts, bilateral 02/02/2018   Vitamin D deficiency 02/02/2018   Neck pain 12/23/2017   GERD (gastroesophageal reflux disease)  04/17/2016   Osteoarthritis of left hip 12/02/2015   Preventative health care 11/22/2015   Osteopenia 11/22/2015   Dacrocystitis 07/19/2015   Left-sided chest wall pain 05/11/2015   Overweight    Herpes zoster  history of facial zoster 05/02/2014   Anemia 11/28/2013   HTN (hypertension) 11/28/2013   Hyperlipidemia 11/28/2013   S/P abdominal supracervical subtotal hysterectomy 11/28/2013   Colon polyps 11/28/2013   Cerebral aneurysm  S/P coiling  Mikel Cella  2006   11/28/2013   Menopause 11/28/2013    PCP: Mosie Lukes, MD   REFERRING PROVIDER: Rosemarie Ax, MD   REFERRING DIAG: [phone] (ICD-10-CM) - Cervical radiculopathy  THERAPY DIAG:  Radiculopathy, cervical region  Cervicalgia  Cramp and spasm  Abnormal posture  Rationale for Evaluation and Treatment: Rehabilitation  ONSET DATE: couple of months ago   SUBJECTIVE:  SUBJECTIVE STATEMENT: Pt reports arm pain much improved, neck still quite flared up especially at end of day PERTINENT HISTORY:  T2DM, HTN, CAD, osteopenia  PAIN:  Are you having pain? Yes: NPRS scale: 4-5/10 Pain location: L upper shoulder Pain description: nagging, dull ache Aggravating factors: worst 8-9/10 at night Relieving factors: ibuprofen, gabapentin  PRECAUTIONS: None  WEIGHT BEARING RESTRICTIONS: No  FALLS:  Has patient fallen in last 6 months? No  LIVING ENVIRONMENT: Lives with: lives with their daughter Lives in: House/apartment Stairs: Yes: Internal: 12 steps; on right going up, on left going up, and can reach both and External: 3 steps; none Has following equipment at home: None  OCCUPATION: retired  PLOF: Independent and Leisure: volunteers at [employer] 3x/week (counseling, prayer meetings)  PATIENT GOALS: get rid of  pain  NEXT MD VISIT: not scheduled  OBJECTIVE:   DIAGNOSTIC FINDINGS:  No relevant imaging  PATIENT SURVEYS:  NDI 14/50= 28% impairment  COGNITION: Overall cognitive status: Within functional limits for tasks assessed  SENSATION: WFL  POSTURE: rounded shoulders and forward head  PALPATION: Tenderness/tightness cervical paraspinals (L>R), L UT, L levator scapulae.    CERVICAL ROM:   Active ROM AROM (deg) eval  Flexion 45  Extension 10  Right lateral flexion 30  Left lateral flexion 25  Right rotation 42  Left rotation 50   (Blank rows = not tested)  UPPER EXTREMITY ROM:  Active ROM Right eval Left eval  Shoulder flexion 110 115  Shoulder extension 70 70  Shoulder abduction 115 125  Shoulder adduction 65 60  Shoulder extension    Shoulder internal rotation (functional) Small of back Small of back  Shoulder external rotation (functional) Scapular spine Scapular spine   (Blank rows = not tested)  UPPER EXTREMITY MMT:  MMT Right eval Left eval  Shoulder flexion 5 5  Shoulder extension    Shoulder abduction 5 5  Shoulder internal rotation 5 5  Shoulder external rotation 5 4+  Middle trapezius    Lower trapezius    Elbow flexion 5 5  Elbow extension 5 5  Wrist flexion 5 5  Wrist extension 5 5  Grip strength fair fair   (Blank rows = not tested)  CERVICAL SPECIAL TESTS:  Spurling's test: Negative  FUNCTIONAL TESTS:  NT  TODAY'S TREATMENT:                                                                                                                              DATE:  08/21/22: Manual: UBE for 4 min level 1 prior to manual techniques: supine for manual stretching for levator scapulae , L upper traps Also contract relax for L levator scapulae, R SM, upper cervical retraction Manual assist, gentle stretching into lower cervical extension Alternated above techniques with gentle cervical traction .   Self/ home management: Education regarding  patient's repetitive positioning and posture, she does read a kindle from her recliner frequently, so advised her to consider  some adaptations to her posture with reading.  Also placed rolled pillow behind back to improve lumbar posture, which did reduce her forward head somewhat, advised her to perhaps try to emulate at home in recliner. She is answering the phone at church with R hand and tilting head to R for this activity.  Advised her to consider an ear bud or speaker phone to avoid prolonged cervical side bending Modalities: Estim (IFC) to L UT x 13 min with MHP to decrease muscle spasm and pain.     08/18/22 Therapeutic Exercise: to improve strength and mobility.  Demo, verbal and tactile cues throughout for technique. UBE L1 x 5 min forward only Self snags with pillow case - extension x 10, rotation x 10 bil  SCM stretch with skin lock - increased parasthesia in arm so stopped Nerve glides- median and ulnar Manual Therapy: to decrease muscle spasm and pain and improve mobility STM/TPR to L UT, LS, anterior scalenes, cervical paraspinals and rhomboids in sitting, gentle pec stretch bil  Modalities: Estim (premod) to L UT x 13 min with MHP to decrease muscle spasm and pain.    08/14/22 Therapeutic Exercise: to improve strength and mobility.  Demo, verbal and tactile cues throughout for technique. UBE L1 x 5 min forward only chin tucks 2x10 Shoulder squeeze 2x10 LS stretch 3x15"  Manual Therapy: to decrease muscle spasm and pain and improve mobility STM/TPR to L UT, LS, anterior scalenes, cervical paraspinals and rhomboids in sitting Modalities: Estim (IFC) to B upper shoulders/neck x 12 min with MHP to decrease muscle spasm and pain.  08/11/2022 Therapeutic Exercise: to improve strength and mobility.  Demo, verbal and tactile cues throughout for technique. UBE L1 x 4 min forward only   focusing on gentle small movements- Retro shoulder rolls x 10, chin tucks x 10 (no hold, 75%),  scap  squeezes x 10, levator stretch 2 x 5 sec hold.  Nerve glides Manual Therapy: to decrease muscle spasm and pain and improve mobility STM/TPR to L UT, LS, anterior scalenes, cervical paraspinals and rhomboids in sitting Modalities: Estim (premod) to L UT x 13 min with MHP to decrease muscle spasm and pain.    08/06/22 Therapeutic Exercise: to improve strength and mobility.  Demo, verbal and tactile cues throughout for technique. UBE x 4 min forward only  Review of HEP - focusing on gentle small movements- Retro shoulder rolls x 10, chin tucks x 10 (no hold, 75%),  scap squeezes x 10, levator stretch 2 x 5 sec hold.  Manual Therapy: to decrease muscle spasm and pain and improve mobility STM to cervical paraspinals, L UT, gentle pec stretch.  Poor tolerance to all manual therapy even with very light touch.  Discontinued.  Modalities: Estim (premod) to L UT x 10 min with MHP to decrease muscle spasm and pain.    PATIENT EDUCATION:  Education details: review of HEP Person educated: Patient Education method: Explanation, Demonstration, and Verbal cues Education comprehension: verbalized understanding and returned demonstration  HOME EXERCISE PROGRAM: Access Code: [email or web address] URL: [email or web address] Date: 08/18/2022 Prepared by: Glenetta Hew  Exercises - Seated Shoulder Rolls  - 3 x daily - 7 x weekly - 1 sets - 10 reps - Seated Cervical Retraction  - 3 x daily - 7 x weekly - 1 sets - 10 reps - 3-5 sec  hold - Seated Scapular Retraction  - 3 x daily - 7 x weekly - 1 sets - 10 reps - 3-5 sec  hold - Gentle  Levator Scapulae Stretch  - 1 x daily - 7 x weekly - 1 sets - 3 reps - 15 sec  hold - Cervical Extension AROM with Strap  - 2 x daily - 7 x weekly - 1 sets - 10 reps - Seated Assisted Cervical Rotation with Towel  - 2 x daily - 7 x weekly - 1 sets - 10 reps - Standing Median Nerve Glide  - 2 x daily - 7 x weekly - 1 sets - 10 reps  ASSESSMENT:  CLINICAL  IMPRESSION: Jnae Mesenbrink reports no significant improvement in L upper traps pain, however L UE radicular pain overall improved which is a good sign for centralization of Sx.  Her neck and upper traps tissue is quite irritable, she was painful with very gentle stretching, attempts at contract relax, and light manual traction.  Therefore spent much of session trying to change some of the repetitive positioning that may be contributing to her Sx.  Mollee Teed continues to demonstrate potential for improvement due to the more centralization of her Sx, and would benefit from continued skilled therapy to address impairments.     OBJECTIVE IMPAIRMENTS: decreased ROM, decreased strength, hypomobility, increased fascial restrictions, increased muscle spasms, postural dysfunction, and pain.   ACTIVITY LIMITATIONS: carrying, lifting, bending, and sleeping  PARTICIPATION LIMITATIONS: cleaning, laundry, driving, and community activity  PERSONAL FACTORS: Age, Time since onset of injury/illness/exacerbation, and 1-2 comorbidities: T2DM, osteopenia, CAD  are also affecting patient's functional outcome.   REHAB POTENTIAL: Excellent  CLINICAL DECISION MAKING: Stable/uncomplicated  EVALUATION COMPLEXITY: Low   GOALS: Goals reviewed with patient? Yes  SHORT TERM GOALS: Target date: 08/18/2022   Patient will be independent with initial HEP.  Baseline: given Goal status: IN PROGRESS   LONG TERM GOALS: Target date: 09/15/2022   Patient will be independent with advanced/ongoing HEP to improve outcomes and carryover.  Baseline:  Goal status: IN PROGRESS  2.  Patient will report 75% improvement in neck pain to improve QOL.  Baseline:  Goal status: IN PROGRESS  3.  Patient will demonstrate 10 deg improvement in cervical ROM all directions for safety with driving.  Baseline: see objective Goal status: IN PROGRESS  4.  Patient will report 15% on NDI to demonstrate improved functional ability.   Baseline: 28% impairment Goal status: IN PROGRESS  5.  Patient will demonstrate improved posture to decrease muscle imbalance. Baseline: forward head posture Goal status: IN PROGRESS  PLAN:  PT FREQUENCY: 1-2x/week  PT DURATION: 6 weeks  PLANNED INTERVENTIONS: Therapeutic exercises, Therapeutic activity, Neuromuscular re-education, Balance training, Gait training, Patient/Family education, Self Care, Joint mobilization, Dry Needling, Electrical stimulation, Spinal mobilization, Cryotherapy, Moist heat, Traction, Ultrasound, Manual therapy, and Re-evaluation  PLAN FOR NEXT SESSION: review and progress postural exercises, manual therapy,address dry needling again.  Modalities PRN   Tim Lair, PT, DPT  08/21/2022, 5:37 PM

## 2022-08-23 ENCOUNTER — Other Ambulatory Visit: Payer: Self-pay | Admitting: Family Medicine

## 2022-08-25 ENCOUNTER — Ambulatory Visit: Payer: Medicare PPO | Admitting: Physical Therapy

## 2022-08-25 DIAGNOSIS — M542 Cervicalgia: Secondary | ICD-10-CM | POA: Diagnosis not present

## 2022-08-25 DIAGNOSIS — M5412 Radiculopathy, cervical region: Secondary | ICD-10-CM | POA: Diagnosis not present

## 2022-08-25 DIAGNOSIS — R293 Abnormal posture: Secondary | ICD-10-CM | POA: Diagnosis not present

## 2022-08-25 DIAGNOSIS — R252 Cramp and spasm: Secondary | ICD-10-CM | POA: Diagnosis not present

## 2022-08-25 NOTE — Therapy (Signed)
OUTPATIENT PHYSICAL THERAPY Treatment   Patient Name: Morgan Medina MRN: [id number] DOB:[birth year], 77 y.o., female Today's Date: 08/25/2022  END OF SESSION:  PT End of Session - 08/25/22 1104     Visit Number 7    Number of Visits 12    Date for PT Re-Evaluation 09/15/22    Authorization Type [employer]    Authorization Time Period 2/21-4/1    Progress Note Due on Visit 10    PT Start Time 1101    PT Stop Time 1159    PT Time Calculation (min) 58 min    Activity Tolerance Patient tolerated treatment well    Behavior During Therapy [facility] for tasks assessed/performed              Past Medical History:  Diagnosis Date   Anemia    Cerebral aneurysm    h/o in 2006   Dacrocystitis 07/19/2015   right   Diabetes mellitus type 2 in obese ([place]) 11/05/2014   GERD (gastroesophageal reflux disease) 04/17/2016   Hyperlipidemia    Hypertension    Osteoarthritis of left hip 12/02/2015   Osteopenia 11/22/2015   Overweight    Past Surgical History:  Procedure Laterality Date   ABDOMINAL HYSTERECTOMY     CEREBRAL ANEURYSM REPAIR     Patient Active Problem List   Diagnosis Date Noted   Cervical radiculopathy 05/07/2022   Cough 10/13/2021   Change in bowel habit 11/30/2020   Coronary artery disease involving native coronary artery of native heart without angina pectoris 11/19/2020   Grade I diastolic dysfunction 0000000   Shortness of breath 09/05/2020   DOE (dyspnea on exertion) 09/05/2020   Bilateral leg edema 09/05/2020   Nonspecific abnormal electrocardiogram (ECG) (EKG) 09/05/2020   Fatigue 09/05/2020   Bradycardia, sinus 09/05/2020   Type 2 diabetes mellitus with hyperosmolarity without coma, without long-term current use of insulin ([place]) 09/05/2020   High serum vitamin B12 08/20/2020   Allergies 11/09/2018   Dry eyes, bilateral 02/02/2018   Cataracts, bilateral 02/02/2018   Vitamin D deficiency 02/02/2018   Neck pain 12/23/2017   GERD (gastroesophageal reflux disease)  04/17/2016   Osteoarthritis of left hip 12/02/2015   Preventative health care 11/22/2015   Osteopenia 11/22/2015   Dacrocystitis 07/19/2015   Left-sided chest wall pain 05/11/2015   Overweight    Herpes zoster  history of facial zoster 05/02/2014   Anemia 11/28/2013   HTN (hypertension) 11/28/2013   Hyperlipidemia 11/28/2013   S/P abdominal supracervical subtotal hysterectomy 11/28/2013   Colon polyps 11/28/2013   Cerebral aneurysm  S/P coiling  Mikel Cella  2006   11/28/2013   Menopause 11/28/2013    PCP: Mosie Lukes, MD   REFERRING PROVIDER: Rosemarie Ax, MD   REFERRING DIAG: [phone] (ICD-10-CM) - Cervical radiculopathy  THERAPY DIAG:  Radiculopathy, cervical region  Cervicalgia  Cramp and spasm  Abnormal posture  Rationale for Evaluation and Treatment: Rehabilitation  ONSET DATE: couple of months ago   SUBJECTIVE:  SUBJECTIVE STATEMENT: Pt reports pain more in shoulder than neck, but considers this progress.    PERTINENT HISTORY:  T2DM, HTN, CAD, osteopenia  PAIN:  Are you having pain? Yes: NPRS scale: 4-5/10 Pain location: L upper shoulder Pain description: nagging, dull ache Aggravating factors: worst 8-9/10 at night Relieving factors: ibuprofen, gabapentin  PRECAUTIONS: None  WEIGHT BEARING RESTRICTIONS: No  FALLS:  Has patient fallen in last 6 months? No  LIVING ENVIRONMENT: Lives with: lives with their daughter Lives in: House/apartment Stairs: Yes: Internal: 12 steps; on right going up, on left going up, and can reach both and External: 3 steps; none Has following equipment at home: None  OCCUPATION: retired  PLOF: Independent and Leisure: volunteers at [employer] 3x/week (counseling, prayer meetings)  PATIENT GOALS: get rid of pain  NEXT MD  VISIT: not scheduled  OBJECTIVE:   DIAGNOSTIC FINDINGS:  No relevant imaging  PATIENT SURVEYS:  NDI 14/50= 28% impairment  COGNITION: Overall cognitive status: Within functional limits for tasks assessed  SENSATION: WFL  POSTURE: rounded shoulders and forward head  PALPATION: Tenderness/tightness cervical paraspinals (L>R), L UT, L levator scapulae.    CERVICAL ROM:   Active ROM AROM (deg) eval AROM  08/25/2022  Flexion 45   Extension 10   Right lateral flexion 30   Left lateral flexion 25   Right rotation 42   Left rotation 50    (Blank rows = not tested)  UPPER EXTREMITY ROM:  Active ROM Right eval Left eval  Shoulder flexion 110 115  Shoulder extension 70 70  Shoulder abduction 115 125  Shoulder adduction 65 60  Shoulder extension    Shoulder internal rotation (functional) Small of back Small of back  Shoulder external rotation (functional) Scapular spine Scapular spine   (Blank rows = not tested)  UPPER EXTREMITY MMT:  MMT Right eval Left eval  Shoulder flexion 5 5  Shoulder extension    Shoulder abduction 5 5  Shoulder internal rotation 5 5  Shoulder external rotation 5 4+  Middle trapezius    Lower trapezius    Elbow flexion 5 5  Elbow extension 5 5  Wrist flexion 5 5  Wrist extension 5 5  Grip strength fair fair   (Blank rows = not tested)  CERVICAL SPECIAL TESTS:  Spurling's test: Negative  FUNCTIONAL TESTS:  NT  TODAY'S TREATMENT:                                                                                                                              DATE:  08/25/22 Therapeutic Exercise: to improve strength and mobility.  Demo, verbal and tactile cues throughout for technique. UBE x 4 min forward  Open books x 10 bil  L stretch x 3 Pillow case stretch review Manual Therapy: to decrease muscle spasm and pain and improve mobility STM/TPR to bil cervical paraspinals and UT, R anterior scalenes, 1st rib mobs.  Self  Care: Education on TENS set up,  electrode care, recommendations for purchase Modalities: TENS to bil UT + MHP x 15 min    08/21/22: Manual: UBE for 4 min level 1 prior to manual techniques: supine for manual stretching for levator scapulae , L upper traps Also contract relax for L levator scapulae, R SM, upper cervical retraction Manual assist, gentle stretching into lower cervical extension Alternated above techniques with gentle cervical traction .   Self/ home management: Education regarding patient's repetitive positioning and posture, she does read a kindle from her recliner frequently, so advised her to consider some adaptations to her posture with reading.  Also placed rolled pillow behind back to improve lumbar posture, which did reduce her forward head somewhat, advised her to perhaps try to emulate at home in recliner. She is answering the phone at church with R hand and tilting head to R for this activity.  Advised her to consider an ear bud or speaker phone to avoid prolonged cervical side bending Modalities: Estim (IFC) to L UT x 13 min with MHP to decrease muscle spasm and pain.     08/18/22 Therapeutic Exercise: to improve strength and mobility.  Demo, verbal and tactile cues throughout for technique. UBE L1 x 5 min forward only Self snags with pillow case - extension x 10, rotation x 10 bil  SCM stretch with skin lock - increased parasthesia in arm so stopped Nerve glides- median and ulnar Manual Therapy: to decrease muscle spasm and pain and improve mobility STM/TPR to L UT, LS, anterior scalenes, cervical paraspinals and rhomboids in sitting, gentle pec stretch bil  Modalities: Estim (premod) to L UT x 13 min with MHP to decrease muscle spasm and pain.    PATIENT EDUCATION:  Education details: HEP update Person educated: Patient Education method: Explanation, Demonstration, and Verbal cues Education comprehension: verbalized understanding and returned  demonstration  HOME EXERCISE PROGRAM: Access Code: [email or web address] URL: [email or web address] Date: 08/25/2022 Prepared by: Glenetta Hew  Exercises - Seated Shoulder Rolls  - 3 x daily - 7 x weekly - 1 sets - 10 reps - Seated Cervical Retraction  - 3 x daily - 7 x weekly - 1 sets - 10 reps - 3-5 sec  hold - Seated Scapular Retraction  - 3 x daily - 7 x weekly - 1 sets - 10 reps - 3-5 sec  hold - Gentle Levator Scapulae Stretch  - 1 x daily - 7 x weekly - 1 sets - 3 reps - 15 sec  hold - Cervical Extension AROM with Strap  - 2 x daily - 7 x weekly - 1 sets - 10 reps - Seated Assisted Cervical Rotation with Towel  - 2 x daily - 7 x weekly - 1 sets - 10 reps - Standing Median Nerve Glide  - 2 x daily - 7 x weekly - 1 sets - 10 reps - Standing Thoracic Open Book at [place]  - 1 x daily - 7 x weekly - 3 sets - 10 reps - Standing 'L' Stretch at Counter  - 1 x daily - 7 x weekly - 3 sets - 10 reps  ASSESSMENT:  CLINICAL IMPRESSION: Najay Cagley reports feeling best benefit overall has been from e-stim, so today discussed options to purchase TENS unit and how to set up with recommendations for inexpensive digital unit.  Also reviewed exercises followed by gentle manual therapy.  Clayton Pruiett continues to demonstrate potential for improvement and would benefit from continued skilled therapy to address impairments.    OBJECTIVE IMPAIRMENTS: decreased  ROM, decreased strength, hypomobility, increased fascial restrictions, increased muscle spasms, postural dysfunction, and pain.   ACTIVITY LIMITATIONS: carrying, lifting, bending, and sleeping  PARTICIPATION LIMITATIONS: cleaning, laundry, driving, and community activity  PERSONAL FACTORS: Age, Time since onset of injury/illness/exacerbation, and 1-2 comorbidities: T2DM, osteopenia, CAD  are also affecting patient's functional outcome.   REHAB POTENTIAL: Excellent  CLINICAL DECISION MAKING: Stable/uncomplicated  EVALUATION  COMPLEXITY: Low   GOALS: Goals reviewed with patient? Yes  SHORT TERM GOALS: Target date: 08/18/2022   Patient will be independent with initial HEP.  Baseline: given Goal status: MET 08/25/22- reports compliance   LONG TERM GOALS: Target date: 09/15/2022   Patient will be independent with advanced/ongoing HEP to improve outcomes and carryover.  Baseline:  Goal status: IN PROGRESS  2.  Patient will report 75% improvement in neck pain to improve QOL.  Baseline:  Goal status: IN PROGRESS  3.  Patient will demonstrate 10 deg improvement in cervical ROM all directions for safety with driving.  Baseline: see objective Goal status: IN PROGRESS  4.  Patient will report 15% on NDI to demonstrate improved functional ability.  Baseline: 28% impairment Goal status: IN PROGRESS  5.  Patient will demonstrate improved posture to decrease muscle imbalance. Baseline: forward head posture Goal status: IN PROGRESS  PLAN:  PT FREQUENCY: 1-2x/week  PT DURATION: 6 weeks  PLANNED INTERVENTIONS: Therapeutic exercises, Therapeutic activity, Neuromuscular re-education, Balance training, Gait training, Patient/Family education, Self Care, Joint mobilization, Dry Needling, Electrical stimulation, Spinal mobilization, Cryotherapy, Moist heat, Traction, Ultrasound, Manual therapy, and Re-evaluation  PLAN FOR NEXT SESSION: review and progress postural exercises, manual therapy,address dry needling again.  Modalities PRN   Rennie Natter, PT, DPT  08/25/2022, 12:04 PM

## 2022-08-27 ENCOUNTER — Ambulatory Visit: Payer: Medicare PPO | Admitting: Physical Therapy

## 2022-08-27 ENCOUNTER — Encounter: Payer: Self-pay | Admitting: Physical Therapy

## 2022-08-27 DIAGNOSIS — R293 Abnormal posture: Secondary | ICD-10-CM | POA: Diagnosis not present

## 2022-08-27 DIAGNOSIS — M542 Cervicalgia: Secondary | ICD-10-CM

## 2022-08-27 DIAGNOSIS — M5412 Radiculopathy, cervical region: Secondary | ICD-10-CM | POA: Diagnosis not present

## 2022-08-27 DIAGNOSIS — R252 Cramp and spasm: Secondary | ICD-10-CM | POA: Diagnosis not present

## 2022-08-27 NOTE — Therapy (Signed)
OUTPATIENT PHYSICAL THERAPY Treatment   Patient Name: Morgan Medina MRN: [id number] DOB:November 25, 1945, 77 y.o., female Today's Date: 08/27/2022  END OF SESSION:  PT End of Session - 08/27/22 1620     Visit Number 8    Number of Visits 12    Date for PT Re-Evaluation 09/15/22    Authorization Type [employer]    Authorization Time Period 2/21-4/1    Progress Note Due on Visit 10    PT Start Time 1617    Activity Tolerance Patient tolerated treatment well    Behavior During Therapy [facility] for tasks assessed/performed              Past Medical History:  Diagnosis Date   Anemia    Cerebral aneurysm    h/o in 2006   Dacrocystitis 07/19/2015   right   Diabetes mellitus type 2 in obese ([place]) 11/05/2014   GERD (gastroesophageal reflux disease) 04/17/2016   Hyperlipidemia    Hypertension    Osteoarthritis of left hip 12/02/2015   Osteopenia 11/22/2015   Overweight    Past Surgical History:  Procedure Laterality Date   ABDOMINAL HYSTERECTOMY     CEREBRAL ANEURYSM REPAIR     Patient Active Problem List   Diagnosis Date Noted   Cervical radiculopathy 05/07/2022   Cough 10/13/2021   Change in bowel habit 11/30/2020   Coronary artery disease involving native coronary artery of native heart without angina pectoris 11/19/2020   Grade I diastolic dysfunction 0000000   Shortness of breath 09/05/2020   DOE (dyspnea on exertion) 09/05/2020   Bilateral leg edema 09/05/2020   Nonspecific abnormal electrocardiogram (ECG) (EKG) 09/05/2020   Fatigue 09/05/2020   Bradycardia, sinus 09/05/2020   Type 2 diabetes mellitus with hyperosmolarity without coma, without long-term current use of insulin ([place]) 09/05/2020   High serum vitamin B12 08/20/2020   Allergies 11/09/2018   Dry eyes, bilateral 02/02/2018   Cataracts, bilateral 02/02/2018   Vitamin D deficiency 02/02/2018   Neck pain 12/23/2017   GERD (gastroesophageal reflux disease) 04/17/2016   Osteoarthritis of left hip 12/02/2015    Preventative health care 11/22/2015   Osteopenia 11/22/2015   Dacrocystitis 07/19/2015   Left-sided chest wall pain 05/11/2015   Overweight    Herpes zoster  history of facial zoster 05/02/2014   Anemia 11/28/2013   HTN (hypertension) 11/28/2013   Hyperlipidemia 11/28/2013   S/P abdominal supracervical subtotal hysterectomy 11/28/2013   Colon polyps 11/28/2013   Cerebral aneurysm  S/P coiling  Mikel Cella  2006   11/28/2013   Menopause 11/28/2013    PCP: Mosie Lukes, MD   REFERRING PROVIDER: Rosemarie Ax, MD   REFERRING DIAG: [phone] (ICD-10-CM) - Cervical radiculopathy  THERAPY DIAG:  Radiculopathy, cervical region  Cervicalgia  Cramp and spasm  Abnormal posture  Rationale for Evaluation and Treatment: Rehabilitation  ONSET DATE: couple of months ago   SUBJECTIVE:  SUBJECTIVE STATEMENT: Pt reports she was very busy today, schedule "packed" which aggravated her neck.     PERTINENT HISTORY:  T2DM, HTN, CAD, osteopenia  PAIN:  Are you having pain? Yes: NPRS scale: 7-8/10 Pain location: L upper shoulder Pain description: nagging, dull ache Aggravating factors: worst 8-9/10 at night Relieving factors: ibuprofen, gabapentin  PRECAUTIONS: None  WEIGHT BEARING RESTRICTIONS: No  FALLS:  Has patient fallen in last 6 months? No  LIVING ENVIRONMENT: Lives with: lives with their daughter Lives in: House/apartment Stairs: Yes: Internal: 12 steps; on right going up, on left going up, and can reach both and External: 3 steps; none Has following equipment at home: None  OCCUPATION: retired  PLOF: Independent and Leisure: volunteers at [employer] 3x/week (counseling, prayer meetings)  PATIENT GOALS: get rid of pain  NEXT MD VISIT: not scheduled  OBJECTIVE:    DIAGNOSTIC FINDINGS:  No relevant imaging  PATIENT SURVEYS:  NDI 14/50= 28% impairment  COGNITION: Overall cognitive status: Within functional limits for tasks assessed  SENSATION: WFL  POSTURE: rounded shoulders and forward head  PALPATION: Tenderness/tightness cervical paraspinals (L>R), L UT, L levator scapulae.    CERVICAL ROM:   Active ROM AROM (deg) eval AROM  08/27/2022  Flexion 45 50  Extension 10 20  Right lateral flexion 30   Left lateral flexion 25    Right rotation 42 43  Left rotation 50 55   (Blank rows = not tested)  UPPER EXTREMITY ROM:  Active ROM Right eval Left eval  Shoulder flexion 110 115  Shoulder extension 70 70  Shoulder abduction 115 125  Shoulder adduction 65 60  Shoulder extension    Shoulder internal rotation (functional) Small of back Small of back  Shoulder external rotation (functional) Scapular spine Scapular spine   (Blank rows = not tested)  UPPER EXTREMITY MMT:  MMT Right eval Left eval  Shoulder flexion 5 5  Shoulder extension    Shoulder abduction 5 5  Shoulder internal rotation 5 5  Shoulder external rotation 5 4+  Middle trapezius    Lower trapezius    Elbow flexion 5 5  Elbow extension 5 5  Wrist flexion 5 5  Wrist extension 5 5  Grip strength fair fair   (Blank rows = not tested)  CERVICAL SPECIAL TESTS:  Spurling's test: Negative  FUNCTIONAL TESTS:  NT  TODAY'S TREATMENT:                                                                                                                              DATE:  08/27/22 Therapeutic Exercise: to improve strength and mobility.  Demo, verbal and tactile cues throughout for technique. Nustep L5 x 5 min  Gentle levator stretches after [place] Ultrasound: x 8 min to L UT 1 MHz, 1.2 w/cm2 cont to decrease inflammation/pain Self Care: Set up of OTC TENS unit, electrode placement and care, toggling between different modes and settings.  Modalities: MHP x 10 min  with  patient's TENS unit.  (Not included in treatment time).    08/25/22 Therapeutic Exercise: to improve strength and mobility.  Demo, verbal and tactile cues throughout for technique. UBE x 4 min forward  Open books x 10 bil  L stretch x 3 Pillow case stretch review Manual Therapy: to decrease muscle spasm and pain and improve mobility STM/TPR to bil cervical paraspinals and UT, R anterior scalenes, 1st rib mobs.  Self Care: Education on TENS set up, electrode care, recommendations for purchase Modalities: TENS to bil UT + MHP x 15 min    08/21/22: Manual: UBE for 4 min level 1 prior to manual techniques: supine for manual stretching for levator scapulae , L upper traps Also contract relax for L levator scapulae, R SM, upper cervical retraction Manual assist, gentle stretching into lower cervical extension Alternated above techniques with gentle cervical traction .   Self/ home management: Education regarding patient's repetitive positioning and posture, she does read a kindle from her recliner frequently, so advised her to consider some adaptations to her posture with reading.  Also placed rolled pillow behind back to improve lumbar posture, which did reduce her forward head somewhat, advised her to perhaps try to emulate at home in recliner. She is answering the phone at church with R hand and tilting head to R for this activity.  Advised her to consider an ear bud or speaker phone to avoid prolonged cervical side bending Modalities: Estim (IFC) to L UT x 13 min with MHP to decrease muscle spasm and pain.    PATIENT EDUCATION:  Education details:  TENS Person educated: Patient Education method: Explanation, Demonstration, and Verbal cues Education comprehension: verbalized understanding and returned demonstration  HOME EXERCISE PROGRAM: Access Code: [email or web address] URL: [email or web address] Date: 08/27/2022 Prepared by: Glenetta Hew  Exercises - Seated Shoulder Rolls   - 3 x daily - 7 x weekly - 1 sets - 10 reps - Seated Cervical Retraction  - 3 x daily - 7 x weekly - 1 sets - 10 reps - 3-5 sec  hold - Seated Scapular Retraction  - 3 x daily - 7 x weekly - 1 sets - 10 reps - 3-5 sec  hold - Gentle Levator Scapulae Stretch  - 1 x daily - 7 x weekly - 1 sets - 3 reps - 15 sec  hold - Cervical Extension AROM with Strap  - 2 x daily - 7 x weekly - 1 sets - 10 reps - Seated Assisted Cervical Rotation with Towel  - 2 x daily - 7 x weekly - 1 sets - 10 reps - Standing Median Nerve Glide  - 2 x daily - 7 x weekly - 1 sets - 10 reps - Standing Thoracic Open Book at [place]  - 1 x daily - 7 x weekly - 3 sets - 10 reps - Standing 'L' Stretch at Counter  - 1 x daily - 7 x weekly - 3 sets - 10 reps  Patient Education - TENS Therapy  ASSESSMENT:  CLINICAL IMPRESSION: Morgan Medina found in waiting area with forward head posture while waiting for PT, needing cues during session to not return to this position during modalities.  She reported some benefit from [place], and demonstrated improved cervical ROM all directions except R rotation today.  We discussed dry needling again, she will think about it.  She did purchase TENS unit, so spent remainder of session on setting up and educating how to use her unit.  Morgan Medina continues  to demonstrate potential for improvement and would benefit from continued skilled therapy to address impairments.    OBJECTIVE IMPAIRMENTS: decreased ROM, decreased strength, hypomobility, increased fascial restrictions, increased muscle spasms, postural dysfunction, and pain.   ACTIVITY LIMITATIONS: carrying, lifting, bending, and sleeping  PARTICIPATION LIMITATIONS: cleaning, laundry, driving, and community activity  PERSONAL FACTORS: Age, Time since onset of injury/illness/exacerbation, and 1-2 comorbidities: T2DM, osteopenia, CAD  are also affecting patient's functional outcome.   REHAB POTENTIAL: Excellent  CLINICAL DECISION MAKING:  Stable/uncomplicated  EVALUATION COMPLEXITY: Low   GOALS: Goals reviewed with patient? Yes  SHORT TERM GOALS: Target date: 08/18/2022   Patient will be independent with initial HEP.  Baseline: given Goal status: MET 08/25/22- reports compliance   LONG TERM GOALS: Target date: 09/15/2022   Patient will be independent with advanced/ongoing HEP to improve outcomes and carryover.  Baseline:  Goal status: IN PROGRESS  2.  Patient will report 75% improvement in neck pain to improve QOL.  Baseline:  Goal status: IN PROGRESS  3.  Patient will demonstrate 10 deg improvement in cervical ROM all directions for safety with driving.  Baseline: see objective Goal status: IN PROGRESS 08/27/22- see objective.   4.  Patient will report 15% on NDI to demonstrate improved functional ability.  Baseline: 28% impairment Goal status: IN PROGRESS  5.  Patient will demonstrate improved posture to decrease muscle imbalance. Baseline: forward head posture Goal status: IN PROGRESS 08/27/22- needs cues, tends to return to extreme forward head posture at rest  PLAN:  PT FREQUENCY: 1-2x/week  PT DURATION: 6 weeks  PLANNED INTERVENTIONS: Therapeutic exercises, Therapeutic activity, Neuromuscular re-education, Balance training, Gait training, Patient/Family education, Self Care, Joint mobilization, Dry Needling, Electrical stimulation, Spinal mobilization, Cryotherapy, Moist heat, Traction, Ultrasound, Manual therapy, and Re-evaluation  PLAN FOR NEXT SESSION: review and progress postural exercises, manual therapy,address dry needling again.  Modalities PRN   Rennie Natter, PT, DPT  08/27/2022, 4:21 PM

## 2022-08-28 ENCOUNTER — Encounter: Payer: Medicare PPO | Admitting: Physical Therapy

## 2022-09-01 ENCOUNTER — Encounter: Payer: Self-pay | Admitting: Physical Therapy

## 2022-09-01 ENCOUNTER — Ambulatory Visit: Payer: Medicare PPO | Admitting: Physical Therapy

## 2022-09-01 DIAGNOSIS — M542 Cervicalgia: Secondary | ICD-10-CM

## 2022-09-01 DIAGNOSIS — M5412 Radiculopathy, cervical region: Secondary | ICD-10-CM

## 2022-09-01 DIAGNOSIS — R293 Abnormal posture: Secondary | ICD-10-CM | POA: Diagnosis not present

## 2022-09-01 DIAGNOSIS — R252 Cramp and spasm: Secondary | ICD-10-CM | POA: Diagnosis not present

## 2022-09-01 NOTE — Therapy (Addendum)
OUTPATIENT PHYSICAL THERAPY Treatment   Patient Name: Morgan Medina MRN: [id number] DOB:08-21-1945, 77 y.o., female Today's Date: 09/01/2022  END OF SESSION:  PT End of Session - 09/01/22 1023     Visit Number 9    Number of Visits 12    Date for PT Re-Evaluation 09/15/22    Authorization Type [employer]    Authorization Time Period 2/21-4/1    Progress Note Due on Visit 10    PT Start Time 1021    PT Stop Time 1110    PT Time Calculation (min) 49 min    Activity Tolerance Patient tolerated treatment well    Behavior During Therapy [facility] for tasks assessed/performed              Past Medical History:  Diagnosis Date   Anemia    Cerebral aneurysm    h/o in 2006   Dacrocystitis 07/19/2015   right   Diabetes mellitus type 2 in obese ([place]) 11/05/2014   GERD (gastroesophageal reflux disease) 04/17/2016   Hyperlipidemia    Hypertension    Osteoarthritis of left hip 12/02/2015   Osteopenia 11/22/2015   Overweight    Past Surgical History:  Procedure Laterality Date   ABDOMINAL HYSTERECTOMY     CEREBRAL ANEURYSM REPAIR     Patient Active Problem List   Diagnosis Date Noted   Cervical radiculopathy 05/07/2022   Cough 10/13/2021   Change in bowel habit 11/30/2020   Coronary artery disease involving native coronary artery of native heart without angina pectoris 11/19/2020   Grade I diastolic dysfunction 0000000   Shortness of breath 09/05/2020   DOE (dyspnea on exertion) 09/05/2020   Bilateral leg edema 09/05/2020   Nonspecific abnormal electrocardiogram (ECG) (EKG) 09/05/2020   Fatigue 09/05/2020   Bradycardia, sinus 09/05/2020   Type 2 diabetes mellitus with hyperosmolarity without coma, without long-term current use of insulin ([place]) 09/05/2020   High serum vitamin B12 08/20/2020   Allergies 11/09/2018   Dry eyes, bilateral 02/02/2018   Cataracts, bilateral 02/02/2018   Vitamin D deficiency 02/02/2018   Neck pain 12/23/2017   GERD (gastroesophageal reflux disease)  04/17/2016   Osteoarthritis of left hip 12/02/2015   Preventative health care 11/22/2015   Osteopenia 11/22/2015   Dacrocystitis 07/19/2015   Left-sided chest wall pain 05/11/2015   Overweight    Herpes zoster  history of facial zoster 05/02/2014   Anemia 11/28/2013   HTN (hypertension) 11/28/2013   Hyperlipidemia 11/28/2013   S/P abdominal supracervical subtotal hysterectomy 11/28/2013   Colon polyps 11/28/2013   Cerebral aneurysm  S/P coiling  Mikel Cella  2006   11/28/2013   Menopause 11/28/2013    PCP: Mosie Lukes, MD   REFERRING PROVIDER: Rosemarie Ax, MD   REFERRING DIAG: [phone] (ICD-10-CM) - Cervical radiculopathy  THERAPY DIAG:  Radiculopathy, cervical region  Cervicalgia  Cramp and spasm  Abnormal posture  Rationale for Evaluation and Treatment: Rehabilitation  ONSET DATE: couple of months ago   SUBJECTIVE:  SUBJECTIVE STATEMENT: [name] reports she thinks she slept wrong this morning, woke up with her neck really hurting, but now is just a twinge.    PERTINENT HISTORY:  T2DM, HTN, CAD, osteopenia  PAIN:  Are you having pain? Yes: NPRS scale: 4/10 Pain location: L upper shoulder Pain description: nagging, dull ache Aggravating factors: worst 8-9/10 at night Relieving factors: ibuprofen, gabapentin  PRECAUTIONS: None  WEIGHT BEARING RESTRICTIONS: No  FALLS:  Has patient fallen in last 6 months? No  LIVING ENVIRONMENT: Lives with: lives with their daughter Lives in: House/apartment Stairs: Yes: Internal: 12 steps; on right going up, on left going up, and can reach both and External: 3 steps; none Has following equipment at home: None  OCCUPATION: retired  PLOF: Independent and Leisure: volunteers at [employer] 3x/week (counseling, prayer  meetings)  PATIENT GOALS: get rid of pain  NEXT MD VISIT: not scheduled  OBJECTIVE:   DIAGNOSTIC FINDINGS:  No relevant imaging  PATIENT SURVEYS:  NDI 14/50= 28% impairment  COGNITION: Overall cognitive status: Within functional limits for tasks assessed  SENSATION: WFL  POSTURE: rounded shoulders and forward head  PALPATION: Tenderness/tightness cervical paraspinals (L>R), L UT, L levator scapulae.    CERVICAL ROM:   Active ROM AROM (deg) eval AROM  08/27/2022  Flexion 45 50  Extension 10 20  Right lateral flexion 30   Left lateral flexion 25    Right rotation 42 43  Left rotation 50 55   (Blank rows = not tested)  UPPER EXTREMITY ROM:  Active ROM Right eval Left eval  Shoulder flexion 110 115  Shoulder extension 70 70  Shoulder abduction 115 125  Shoulder adduction 65 60  Shoulder extension    Shoulder internal rotation (functional) Small of back Small of back  Shoulder external rotation (functional) Scapular spine Scapular spine   (Blank rows = not tested)  UPPER EXTREMITY MMT:  MMT Right eval Left eval  Shoulder flexion 5 5  Shoulder extension    Shoulder abduction 5 5  Shoulder internal rotation 5 5  Shoulder external rotation 5 4+  Middle trapezius    Lower trapezius    Elbow flexion 5 5  Elbow extension 5 5  Wrist flexion 5 5  Wrist extension 5 5  Grip strength fair fair   (Blank rows = not tested)  CERVICAL SPECIAL TESTS:  Spurling's test: Negative  FUNCTIONAL TESTS:  NT  TODAY'S TREATMENT:                                                                                                                              DATE:  09/01/22 Therapeutic Exercise: to improve strength and mobility.  Demo, verbal and tactile cues throughout for technique. UBE L2 x 6 min (30f/3b) Rows RTB x 20 Bil shoulder extension RTB 2 x 10  Bil shoulder ER RTB x 15  Manual Therapy: to decrease muscle spasm and pain and improve mobility STM/TPR to bil UT,  L/S, cervical paraspinals, anterior scalenes, SCM.  L levator scapulae release.  Modalities: Estim (premond) to bil UT x 10 min with MHP to decrease muscle spasm and pain.    08/27/22 Therapeutic Exercise: to improve strength and mobility.  Demo, verbal and tactile cues throughout for technique. Nustep L5 x 5 min  Gentle levator stretches after [place] Ultrasound: x 8 min to L UT 1 MHz, 1.2 w/cm2 cont to decrease inflammation/pain Self Care: Set up of OTC TENS unit, electrode placement and care, toggling between different modes and settings.  Modalities: MHP x 10 min with patient's TENS unit.  (Not included in treatment time).    08/25/22 Therapeutic Exercise: to improve strength and mobility.  Demo, verbal and tactile cues throughout for technique. UBE x 4 min forward  Open books x 10 bil  L stretch x 3 Pillow case stretch review Manual Therapy: to decrease muscle spasm and pain and improve mobility STM/TPR to bil cervical paraspinals and UT, R anterior scalenes, 1st rib mobs.  Self Care: Education on TENS set up, electrode care, recommendations for purchase Modalities: TENS to bil UT + MHP x 15 min    PATIENT EDUCATION:  Education details:  HEP update, issued RTB Person educated: Patient Education method: Explanation, Demonstration, and Verbal cues Education comprehension: verbalized understanding and returned demonstration  HOME EXERCISE PROGRAM: Access Code: [email or web address] URL: [email or web address] Date: 09/01/2022 Prepared by: Glenetta Hew  Exercises - Seated Shoulder Rolls  - 3 x daily - 7 x weekly - 1 sets - 10 reps - Seated Cervical Retraction  - 3 x daily - 7 x weekly - 1 sets - 10 reps - 3-5 sec  hold - Seated Scapular Retraction  - 3 x daily - 7 x weekly - 1 sets - 10 reps - 3-5 sec  hold - Gentle Levator Scapulae Stretch  - 1 x daily - 7 x weekly - 1 sets - 3 reps - 15 sec  hold - Cervical Extension AROM with Strap  - 2 x daily - 7 x weekly - 1 sets - 10  reps - Seated Assisted Cervical Rotation with Towel  - 2 x daily - 7 x weekly - 1 sets - 10 reps - Standing Median Nerve Glide  - 2 x daily - 7 x weekly - 1 sets - 10 reps - Standing Thoracic Open Book at [place]  - 1 x daily - 7 x weekly - 3 sets - 10 reps - Standing 'L' Stretch at Counter  - 1 x daily - 7 x weekly - 3 sets - 10 reps - Scapular Retraction with Resistance  - 1 x daily - 7 x weekly - 2 sets - 10 reps - Shoulder Extension with Resistance  - 1 x daily - 7 x weekly - 2 sets - 10 reps - Standing Shoulder External Rotation with Resistance  - 1 x daily - 7 x weekly - 2 sets - 10 reps  Patient Education - TENS Therapy  ASSESSMENT:  CLINICAL IMPRESSION: Akiya Faciane reports improvement in shoulder/neck pain now, has not had any more radicular symptoms.  Reports feeling overall progress with more generalized ache now and less sharp pain.  Today progressed HEP for postural strengthening, which she tolerated well without complaint, issued RTB, followed by manual therapy and Estim + MHP.  Trang Tisler continues to demonstrate potential for improvement and would benefit from continued skilled therapy to address impairments.    OBJECTIVE IMPAIRMENTS: decreased ROM, decreased strength, hypomobility, increased fascial restrictions, increased  muscle spasms, postural dysfunction, and pain.   ACTIVITY LIMITATIONS: carrying, lifting, bending, and sleeping  PARTICIPATION LIMITATIONS: cleaning, laundry, driving, and community activity  PERSONAL FACTORS: Age, Time since onset of injury/illness/exacerbation, and 1-2 comorbidities: T2DM, osteopenia, CAD  are also affecting patient's functional outcome.   REHAB POTENTIAL: Excellent  CLINICAL DECISION MAKING: Stable/uncomplicated  EVALUATION COMPLEXITY: Low   GOALS: Goals reviewed with patient? Yes  SHORT TERM GOALS: Target date: 08/18/2022   Patient will be independent with initial HEP.  Baseline: given Goal status: MET 08/25/22- reports  compliance   LONG TERM GOALS: Target date: 09/15/2022   Patient will be independent with advanced/ongoing HEP to improve outcomes and carryover.  Baseline:  Goal status: IN PROGRESS  2.  Patient will report 75% improvement in neck pain to improve QOL.  Baseline:  Goal status: IN PROGRESS  3.  Patient will demonstrate 10 deg improvement in cervical ROM all directions for safety with driving.  Baseline: see objective Goal status: IN PROGRESS 08/27/22- see objective.   4.  Patient will report 15% on NDI to demonstrate improved functional ability.  Baseline: 28% impairment Goal status: IN PROGRESS  5.  Patient will demonstrate improved posture to decrease muscle imbalance. Baseline: forward head posture Goal status: IN PROGRESS 08/27/22- needs cues, tends to return to extreme forward head posture at rest  PLAN:  PT FREQUENCY: 1-2x/week  PT DURATION: 6 weeks  PLANNED INTERVENTIONS: Therapeutic exercises, Therapeutic activity, Neuromuscular re-education, Balance training, Gait training, Patient/Family education, Self Care, Joint mobilization, Dry Needling, Electrical stimulation, Spinal mobilization, Cryotherapy, Moist heat, Traction, Ultrasound, Manual therapy, and Re-evaluation  PLAN FOR NEXT SESSION: continue postural strengthening as tolerated, modalities PRN.  Progress note needed.    Jena Gauss, PT, DPT  09/01/2022, 12:04 PM  PHYSICAL THERAPY DISCHARGE SUMMARY  Visits from Start of Care: 9  Current functional level related to goals / functional outcomes: See above, decreased radicular symptoms.    Remaining deficits: See above   Education / Equipment: HEP, TENS  Plan: Patient agrees to discharge.  Patient goals were not met. Patient cancelled visits and asked to be placed on 30 day hold on 09/08/22 since she has her HEP and TENS.  She has not returned to skilled therapy since 09/01/22 visit so is now discharged.   Jena Gauss, PT, DPT 10:37 AM  10/29/2022

## 2022-09-08 ENCOUNTER — Ambulatory Visit: Payer: Medicare PPO

## 2022-09-29 ENCOUNTER — Encounter: Payer: Self-pay | Admitting: *Deleted

## 2022-11-03 ENCOUNTER — Other Ambulatory Visit: Payer: Self-pay | Admitting: Family Medicine

## 2022-11-03 ENCOUNTER — Ambulatory Visit (INDEPENDENT_AMBULATORY_CARE_PROVIDER_SITE_OTHER): Payer: Self-pay | Admitting: Pharmacist

## 2022-11-03 ENCOUNTER — Telehealth: Payer: Self-pay | Admitting: Pharmacist

## 2022-11-03 DIAGNOSIS — E1100 Type 2 diabetes mellitus with hyperosmolarity without nonketotic hyperglycemic-hyperosmolar coma (NKHHC): Secondary | ICD-10-CM

## 2022-11-03 DIAGNOSIS — I10 Essential (primary) hypertension: Secondary | ICD-10-CM

## 2022-11-03 DIAGNOSIS — E782 Mixed hyperlipidemia: Secondary | ICD-10-CM

## 2022-11-03 MED ORDER — AMLODIPINE BESYLATE 5 MG PO TABS: 5.0000 mg | ORAL_TABLET | Freq: Every day | ORAL | 0 refills | Status: DC

## 2022-11-03 MED ORDER — LOSARTAN POTASSIUM 100 MG PO TABS: 100.0000 mg | ORAL_TABLET | Freq: Every evening | ORAL | 0 refills | Status: DC

## 2022-11-03 MED ORDER — POTASSIUM CHLORIDE CRYS ER 20 MEQ PO TBCR: EXTENDED_RELEASE_TABLET | ORAL | 0 refills | Status: DC

## 2022-11-03 NOTE — Telephone Encounter (Signed)
Patient called regarding adherence. See phone visit notes.

## 2022-11-03 NOTE — Progress Notes (Signed)
11/03/2022 Name: Morgan Medina MRN: [id number] DOB: 19-Jul-1945  Chief Complaint  Patient presents with   Hyperlipidemia   Hypertension   Medication Management    Morgan Medina is a 77 y.o. year old female who presented for a telephone visit.   They were referred to the pharmacist by a quality report for assistance in managing diabetes, hyperlipidemia, and complex medication management.   Patient was on [facility] list in 2023 but she filled statin at the end of 2023.  Subjective:  Care Team: Primary Care Provider: Bradd Canary, MD ; Next Scheduled Visit: needs to reschedule appt   Medication Access/Adherence  Current Pharmacy:  [id number] - [place], [place] - [address] [place] [address] Phone: [phone] Fax: [phone]   Patient reports affordability concerns with their medications: No  Patient reports access/transportation concerns to their pharmacy: No  Patient reports adherence concerns with their medications:  No      Diabetes:  Current medications: metformin Er 500mg  daily   Current glucose readings: has not been checking recently She endorses she does has glucometer and supplies at home .    Hypertension:  Current medications: losartan 100mg  daily and amlodipine 5mg  daily    Patient has a validated, automated, upper arm home BP cuff Current blood pressure readings readings: has not been checking regularly at home.    Hyperlipidemia/ASCVD Risk Reduction  Current lipid lowering medications: rosuvastatin 5mg  every other day - this medication was taken off her list but patient has filled in Feb 2024 and May 2024. Patient states she is taking every other day.   Patient requests refills for amlodipine, losartan, gabapentin, famotidine and potassium.   Objective:  Lab Results  Component Value Date   HGBA1C 7.1 (H) 05/06/2022    Lab Results  Component Value Date   CREATININE  0.76 05/06/2022   BUN 16 05/06/2022   NA 140 05/06/2022   K 3.8 05/06/2022   CL 103 05/06/2022   CO2 30 05/06/2022    Lab Results  Component Value Date   CHOL 221 (H) 05/06/2022   HDL 55.70 05/06/2022   LDLCALC 138 (H) 05/06/2022   LDLDIRECT 181.0 01/03/2020   TRIG 136.0 05/06/2022   CHOLHDL 4 05/06/2022   BP Readings from Last 3 Encounters:  06/18/22 (!) 154/90  05/13/22 128/70  05/06/22 128/74    Medications Reviewed Today     Reviewed by Henrene Pastor, RPH-CPP (Pharmacist) on 11/03/22 at 1527  Med List Status: <None>   Medication Order Taking? Sig Documenting Provider Last Dose Status Informant  amLODipine (NORVASC) 5 MG tablet [id number] Yes TAKE 1 TABLET (5 MG TOTAL) BY MOUTH DAILY. Bradd Canary, MD Taking Active   aspirin 81 MG tablet [id number] Yes Takes every other day [provider] Taking Active   Blood Glucose Monitoring Suppl (ONE [facility] BASIC SYSTEM) W/DEVICE KIT [id number] Yes Use to check blood sugar. DX. E11.9 Bradd Canary, MD Taking Active   carvedilol (COREG) 25 MG tablet [id number] Yes TAKE 1 TABLET (25 MG TOTAL) BY MOUTH TWICE A DAY WITH MEALS Bradd Canary, MD Taking Active   Cyanocobalamin (VITAMIN B 12) 250 MCG LOZG [id number] Yes Take by mouth. [provider] Taking Active   famotidine (PEPCID) 40 MG tablet [id number] Yes TAKE 1 TABLET BY MOUTH EVERY DAY Bradd Canary, MD Taking Active   ferrous sulfate 325 (65 FE) MG tablet [id number] Yes TAKE 1 TABLET BY MOUTH EVERY  DAY WITH BREAKFAST Bradd Canary, MD Taking Active   furosemide (LASIX) 40 MG tablet [id number] Yes Take 1 tablet (40 mg total) by mouth 2 (two) times daily. 40 mg twice daily every other day. On the odd days 40 mg once daily. Bradd Canary, MD Taking Active   gabapentin (NEURONTIN) 100 MG capsule [id number] Yes Take 1 capsule (100 mg total) by mouth 3 (three) times daily. Myra Rude, MD Taking Active   losartan (COZAAR) 100 MG tablet [id number] Yes Take 1 tablet  (100 mg total) by mouth every evening. Bradd Canary, MD Taking Active   metFORMIN (GLUCOPHAGE-XR) 500 MG 24 hr tablet [id number] Yes TAKE 1 TABLET (500 MG TOTAL) BY MOUTH DAILY. Bradd Canary, MD Taking Active   Multiple Vitamin (MULTIVITAMIN) capsule [id number] Yes Take 1 capsule by mouth daily. [provider] Taking Active   ONE TOUCH LANCETS MISC [id number] Yes Use as directed once daily to check blood sugar. DX: E11.9 Bradd Canary, MD Taking Active   potassium chloride SA (KLOR-CON M20) 20 MEQ tablet [id number] Yes TAKE 1 TABLET BY MOUTH EVERY OTHER DAY Tobb, Kardie, DO Taking Active   RESTASIS 0.05 % ophthalmic emulsion [id number] Yes INSTILL 1 DROP INTO BOTH EYES TWICE A DAY [provider] Taking Active   rosuvastatin (CRESTOR) 5 MG tablet [id number] Yes Take 5 mg by mouth every other day. [provider] Taking Active   Vitamin D, Cholecalciferol, 25 MCG (1000 UT) CAPS [id number] Yes Take 1,000 Units by mouth daily. [provider] Taking Active               Assessment/Plan:   Diabetes: - Reviewed long term cardiovascular and renal outcomes of uncontrolled blood sugar - Reviewed goal A1c, goal fasting, and goal 2 hour post prandial glucose - Recommend to continue metformin ER 500mg  daily    Hypertension: - Reviewed long term cardiovascular and renal outcomes of uncontrolled blood pressure - Reviewed appropriate blood pressure monitoring technique and reviewed goal blood pressure. Recommended to check home blood pressure and heart rate 2 to 3 times per day.  - discussed limiting intake of sodium.  - Recommend to continue losartan and amlodipine    Hyperlipidemia/ASCVD Risk Reduction: Last LDL was not at goal - has since started rosuvastatin every other day.  - continue to take rosuvastatin every other day.  Meds below were refilled. [facility] since [facility] has refills and requested they contact Dr Schmidt's  office  for RF on gabapentin.   Meds ordered this encounter  Medications   amLODipine (NORVASC) 5 MG tablet    Sig: Take 1 tablet (5 mg total) by mouth daily.    Dispense:  100 tablet    Refill:  0   losartan (COZAAR) 100 MG tablet    Sig: Take 1 tablet (100 mg total) by mouth every evening.    Dispense:  100 tablet    Refill:  0   potassium chloride SA (KLOR-CON M20) 20 MEQ tablet    Sig: TAKE 1 TABLET BY MOUTH EVERY OTHER DAY    Dispense:  15 tablet    Refill:  0   .  Forwarded request for scheduler to reach out to to patient to reschedule follow up with PCP.   Follow Up Plan: 3 to 4 months  Henrene Pastor, PharmD Clinical Pharmacist [facility]

## 2022-11-04 ENCOUNTER — Ambulatory Visit: Payer: Medicare PPO | Admitting: Family Medicine

## 2022-11-26 ENCOUNTER — Other Ambulatory Visit: Payer: Self-pay | Admitting: Family Medicine

## 2022-12-06 ENCOUNTER — Other Ambulatory Visit: Payer: Self-pay | Admitting: Family Medicine

## 2022-12-10 ENCOUNTER — Encounter: Payer: Self-pay | Admitting: Family Medicine

## 2022-12-10 ENCOUNTER — Ambulatory Visit (INDEPENDENT_AMBULATORY_CARE_PROVIDER_SITE_OTHER): Payer: Medicare PPO | Admitting: Family Medicine

## 2022-12-10 VITALS — BP 150/73 | HR 65 | Ht 67.0 in | Wt 195.0 lb

## 2022-12-10 DIAGNOSIS — E1100 Type 2 diabetes mellitus with hyperosmolarity without nonketotic hyperglycemic-hyperosmolar coma (NKHHC): Secondary | ICD-10-CM | POA: Diagnosis not present

## 2022-12-10 DIAGNOSIS — Z7984 Long term (current) use of oral hypoglycemic drugs: Secondary | ICD-10-CM

## 2022-12-10 DIAGNOSIS — E119 Type 2 diabetes mellitus without complications: Secondary | ICD-10-CM | POA: Diagnosis not present

## 2022-12-10 DIAGNOSIS — I10 Essential (primary) hypertension: Secondary | ICD-10-CM

## 2022-12-10 DIAGNOSIS — E782 Mixed hyperlipidemia: Secondary | ICD-10-CM | POA: Diagnosis not present

## 2022-12-10 LAB — COMPREHENSIVE METABOLIC PANEL
ALT: 10 U/L (ref 0–35)
AST: 13 U/L (ref 0–37)
Albumin: 4.3 g/dL (ref 3.5–5.2)
Alkaline Phosphatase: 49 U/L (ref 39–117)
BUN: 16 mg/dL (ref 6–23)
CO2: 30 mEq/L (ref 19–32)
Calcium: 9.7 mg/dL (ref 8.4–10.5)
Chloride: 102 mEq/L (ref 96–112)
Creatinine, Ser: 0.78 mg/dL (ref 0.40–1.20)
GFR: 74.38 mL/min (ref >60.00)
Glucose, Bld: 125 mg/dL — ABNORMAL HIGH (ref 70–99)
Potassium: 3.5 mEq/L (ref 3.5–5.1)
Sodium: 142 mEq/L (ref 135–145)
Total Bilirubin: 0.4 mg/dL (ref 0.2–1.2)
Total Protein: 7.3 g/dL (ref 6.0–8.3)

## 2022-12-10 LAB — HEMOGLOBIN A1C: Hgb A1c MFr Bld: 6.7 % — ABNORMAL HIGH (ref 4.6–6.5)

## 2022-12-10 LAB — LIPID PANEL
Cholesterol: 267 mg/dL — ABNORMAL HIGH (ref 0–200)
HDL: 51.7 mg/dL (ref >39.00)
LDL Cholesterol: 192 mg/dL — ABNORMAL HIGH (ref 0–99)
NonHDL: 215.42
Total CHOL/HDL Ratio: 5
Triglycerides: 116 mg/dL (ref 0.0–149.0)
VLDL: 23.2 mg/dL (ref 0.0–40.0)

## 2022-12-10 LAB — MICROALBUMIN / CREATININE URINE RATIO
Creatinine,U: 119.1 mg/dL
Microalb Creat Ratio: 2.4 mg/g (ref 0.0–30.0)
Microalb, Ur: 2.8 mg/dL — ABNORMAL HIGH (ref 0.0–1.9)

## 2022-12-10 MED ORDER — AMLODIPINE BESYLATE 5 MG PO TABS
5.0000 mg | ORAL_TABLET | Freq: Every day | ORAL | 1 refills | Status: DC
Start: 2022-12-10 — End: 2023-11-06

## 2022-12-10 MED ORDER — RYBELSUS 3 MG PO TABS
3.0000 mg | ORAL_TABLET | Freq: Every day | ORAL | 3 refills | Status: DC
Start: 2022-12-10 — End: 2023-05-06

## 2022-12-10 NOTE — Progress Notes (Signed)
Established Patient Office Visit  Subjective   Patient ID: Morgan Medina, female    DOB: 09-07-1945  Age: 77 y.o. MRN: [id number]  Chief Complaint  Patient presents with   Medical Management of Chronic Issues    HPI  Patient is here for routine 106-month follow-up.   Discussed the use of AI scribe software for clinical note transcription with the patient, who gave verbal consent to proceed.  History of Present Illness   The patient, with a history of hypertension, hyperlipidemia, and diabetes, presents for routine follow-up. They report compliance with their current medication regimen, including amlodipine 5 mg daily, carvedilol 25 mg twice daily, Lasix 40 mg daily, losartan 100 mg daily, Crestor 5 mg every other day, an iron supplement, Pepcid, aspirin, and potassium 20 mEq every other day. They also take gabapentin as needed for a previously diagnosed pinched nerve.  Regarding their diabetes, they report discontinuing metformin due to severe gastrointestinal side effects, including diarrhea and abdominal pain. These symptoms resolved upon discontinuation of the medication. They report a last A1c of 7.1 about seven months ago. They have been attempting to manage their diabetes with diet, but note that their blood sugars fluctuate, with recent readings ranging from 127 to 147.  The patient also has a history of hyperlipidemia, managed with Crestor. Their last cholesterol check was in November and was elevated. They also report a history of hypertension, with home readings that fluctuate. Their blood pressure at today's visit was elevated.            ROS All review of systems negative except what is listed in the HPI    Objective:     BP (!) 150/73   Pulse 65   Ht 5\' 7"  (1.702 m)   Wt 195 lb (88.5 kg)   SpO2 98%   BMI 30.54 kg/m    Physical Exam Vitals reviewed.  Constitutional:      Appearance: Normal appearance.  Cardiovascular:     Rate and Rhythm: Normal rate and  regular rhythm.     Pulses: Normal pulses.     Heart sounds: Normal heart sounds.  Pulmonary:     Effort: Pulmonary effort is normal.     Breath sounds: Normal breath sounds.  Musculoskeletal:     Right lower leg: No edema.     Left lower leg: No edema.  Skin:    General: Skin is warm and dry.  Neurological:     Mental Status: She is alert and oriented to person, place, and time.  Psychiatric:        Mood and Affect: Mood normal.        Behavior: Behavior normal.        Thought Content: Thought content normal.        Judgment: Judgment normal.      No results found for any visits on 12/10/22.    The 10-year ASCVD risk score (Arnett DK, et al., 2019) is: 42.9%    Assessment & Plan:   Problem List Items Addressed This Visit     HTN (hypertension)    Hypertension: Blood pressure elevated today at 150/73. Patient reports fluctuating readings at home. -Continue current antihypertensive regimen (Amlodipine 5mg  daily, Carvedilol 25mg  twice daily, Losartan 100mg  daily). -Refill Amlodipine prescription. -Schedule a nurse visit for blood pressure check in two weeks. Patient to bring home readings for comparison.      Relevant Medications   amLODipine (NORVASC) 5 MG tablet   Hyperlipidemia    Hyperlipidemia:  Last cholesterol level was high in November. -Continue Rosuvastatin 5mg  every other day. -Order fasting blood work today to reassess lipid profile.      Relevant Medications   amLODipine (NORVASC) 5 MG tablet   Other Relevant Orders   Lipid panel   Type 2 diabetes mellitus with hyperosmolarity without coma, without long-term current use of insulin (HCC) - Primary    Type 2 Diabetes Mellitus: Last A1c was 7.1 seven months ago. Patient reports discontinuation of Metformin due to gastrointestinal side effects. Blood glucose levels fluctuating but generally under 150. -Discontinue Metformin due to intolerance. -Initiate a different oral antidiabetic medication as she is  not interested in injections. Adding Rybelsus - information provided.  -Labs today      Relevant Medications   Semaglutide (RYBELSUS) 3 MG TABS   Other Relevant Orders   HgB A1c   Microalbumin / creatinine urine ratio   Comprehensive metabolic panel   Other Visit Diagnoses     Diabetes mellitus treated with oral medication (HCC)       Relevant Medications   Semaglutide (RYBELSUS) 3 MG TABS   Other Relevant Orders   HgB A1c   Microalbumin / creatinine urine ratio   Comprehensive metabolic panel       Return in about 6 months (around 06/11/2023) for routine follow-up.    Clayborne Dana, NP

## 2022-12-10 NOTE — Assessment & Plan Note (Signed)
Type 2 Diabetes Mellitus: Last A1c was 7.1 seven months ago. Patient reports discontinuation of Metformin due to gastrointestinal side effects. Blood glucose levels fluctuating but generally under 150. -Discontinue Metformin due to intolerance. -Initiate a different oral antidiabetic medication as she is not interested in injections. Adding Rybelsus - information provided.  -Labs today

## 2022-12-10 NOTE — Assessment & Plan Note (Signed)
Hypertension: Blood pressure elevated today at 150/73. Patient reports fluctuating readings at home. -Continue current antihypertensive regimen (Amlodipine 5mg  daily, Carvedilol 25mg  twice daily, Losartan 100mg  daily). -Refill Amlodipine prescription. -Schedule a nurse visit for blood pressure check in two weeks. Patient to bring home readings for comparison.

## 2022-12-10 NOTE — Assessment & Plan Note (Signed)
Hyperlipidemia: Last cholesterol level was high in November. -Continue Rosuvastatin 5mg  every other day. -Order fasting blood work today to reassess lipid profile.

## 2022-12-23 ENCOUNTER — Other Ambulatory Visit: Payer: Self-pay | Admitting: Family Medicine

## 2023-01-08 ENCOUNTER — Ambulatory Visit (INDEPENDENT_AMBULATORY_CARE_PROVIDER_SITE_OTHER): Payer: Medicare PPO | Admitting: Emergency Medicine

## 2023-01-08 VITALS — Ht 67.5 in | Wt 195.0 lb

## 2023-01-08 DIAGNOSIS — Z0000 Encounter for general adult medical examination without abnormal findings: Secondary | ICD-10-CM

## 2023-01-08 NOTE — Progress Notes (Signed)
Subjective:  Per patient no change in vitals since last visit; unable to obtain new vitals due to this being a telehealth visit.  Patient was unable to self-report vital signs via telehealth due to a lack of equipment at home.    Morgan Medina is a 77 y.o. female who presents for Medicare Annual (Subsequent) preventive examination.  Visit Complete: Virtual  I connected with  Morgan Medina on 01/08/23 by a audio enabled telemedicine application and verified that I am speaking with the correct person using two identifiers.  Patient Location: Home  Provider Location: Home Office  I discussed the limitations of evaluation and management by telemedicine. The patient expressed understanding and agreed to proceed.  Patient Medicare AWV questionnaire was completed by the patient on 01/07/23; I have confirmed that all information answered by patient is correct and no changes since this date.  Review of Systems     Cardiac Risk Factors include: advanced age (>31men, >97 women);diabetes mellitus;dyslipidemia;hypertension;obesity (BMI >30kg/m2);Other (see comment), Risk factor comments: known CAD     Objective:    Today's Vitals   01/08/23 0929  Weight: 195 lb (88.5 kg)  Height: 5' 7.5" (1.715 m)   Body mass index is 30.09 kg/m.     01/08/2023    9:42 AM 08/04/2022    2:56 PM 01/06/2022    3:23 PM 09/10/2021    9:28 PM 11/19/2020    3:54 PM 11/08/2019    9:40 AM 10/26/2018    9:09 AM  Advanced Directives  Does Patient Have a Medical Advance Directive? No No No No No No No  Does patient want to make changes to medical advance directive?       No - Patient declined  Would patient like information on creating a medical advance directive? Yes (MAU/Ambulatory/Procedural Areas - Information given) No - Patient declined   Yes (MAU/Ambulatory/Procedural Areas - Information given) No - Patient declined     Current Medications (verified) Outpatient Encounter Medications as of 01/08/2023   Medication Sig   amLODipine (NORVASC) 5 MG tablet Take 1 tablet (5 mg total) by mouth daily.   aspirin 81 MG tablet Takes every other day   Blood Glucose Monitoring Suppl (ONE TOUCH BASIC SYSTEM) W/DEVICE KIT Use to check blood sugar. DX. E11.9   carvedilol (COREG) 25 MG tablet TAKE 1 TABLET (25 MG TOTAL) BY MOUTH TWICE A DAY WITH MEALS   famotidine (PEPCID) 40 MG tablet TAKE 1 TABLET BY MOUTH EVERY DAY   ferrous sulfate 325 (65 FE) MG tablet TAKE 1 TABLET BY MOUTH EVERY DAY WITH BREAKFAST   furosemide (LASIX) 40 MG tablet TAKE 1 TABLET (40 MG TOTAL) BY MOUTH 2 (TWO) TIMES DAILY. 40 MG TWICE DAILY EVERY OTHER DAY. ON THE ODD DAYS 40 MG ONCE DAILY.   gabapentin (NEURONTIN) 100 MG capsule TAKE 1 CAPSULE (100 MG TOTAL) BY MOUTH THREE TIMES DAILY. (Patient taking differently: Take 100 mg by mouth daily as needed.)   losartan (COZAAR) 100 MG tablet Take 1 tablet (100 mg total) by mouth every evening.   Multiple Vitamin (MULTIVITAMIN) capsule Take 1 capsule by mouth daily.   ONE TOUCH LANCETS MISC Use as directed once daily to check blood sugar. DX: E11.9   potassium chloride SA (KLOR-CON M20) 20 MEQ tablet Take 1 tablet (20 mEq total) by mouth every other day.   RESTASIS 0.05 % ophthalmic emulsion INSTILL 1 DROP INTO BOTH EYES TWICE A DAY   rosuvastatin (CRESTOR) 5 MG tablet Take 5 mg by mouth  every other day.   Semaglutide (RYBELSUS) 3 MG TABS Take 1 tablet (3 mg total) by mouth daily.   Vitamin D, Cholecalciferol, 25 MCG (1000 UT) CAPS Take 1,000 Units by mouth daily.   No facility-administered encounter medications on file as of 01/08/2023.    Allergies (verified) Metformin and related   History: Past Medical History:  Diagnosis Date   Anemia    Cerebral aneurysm    h/o in 2006   Dacrocystitis 07/19/2015   right   Diabetes mellitus type 2 in obese 11/05/2014   GERD (gastroesophageal reflux disease) 04/17/2016   Hyperlipidemia    Hypertension    Osteoarthritis of left hip 12/02/2015    Osteopenia 11/22/2015   Overweight    Past Surgical History:  Procedure Laterality Date   ABDOMINAL HYSTERECTOMY     CEREBRAL ANEURYSM REPAIR     Family History  Problem Relation Age of Onset   Alzheimer's disease Mother    Cerebral aneurysm Father    Lupus Daughter    Cancer Maternal Aunt        breast   Diabetes Maternal Grandmother    Stroke Maternal Grandfather    Alzheimer's disease Paternal Grandmother    Stroke Paternal Grandfather    Social History   Socioeconomic History   Marital status: Widowed    Spouse name: Not on file   Number of children: 1   Years of education: Not on file   Highest education level: Not on file  Occupational History   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 works as a Education officer, environmental, retired from [employer]  Other Topics Concern   Not on file  Social History Narrative   1 daughter, 3 grandchildren and 2 Art gallery manager. Daughter and 1 granddaughter lives with her   Social Determinants of Health   Financial Resource Strain: Low Risk  (01/07/2023)   Overall Financial Resource Strain (CARDIA)    Difficulty of Paying Living Expenses: Not very hard  Food Insecurity: No Food Insecurity (01/07/2023)   Hunger Vital Sign    Worried About Running Out of Food in the Last Year: Never true    Ran Out of Food in the Last Year: Never true  Transportation Needs: No Transportation Needs (01/07/2023)   PRAPARE - Administrator, Civil Service (Medical): No    Lack of Transportation (Non-Medical): No  Physical Activity: Insufficiently Active (01/07/2023)   Exercise Vital Sign    Days of Exercise per Week: 3 days    Minutes of Exercise per Session: 30 min   Stress: No Stress Concern Present (01/07/2023)   [employer] of Occupational Health - Occupational Stress Questionnaire    Feeling of Stress : Only a little  Social Connections: Moderately Integrated (01/07/2023)   Social Connection and Isolation Panel [NHANES]    Frequency of Communication with Friends and Family: More than three times a week    Frequency of Social Gatherings with Friends and Family: More than three times a week    Attends Religious Services: More than 4 times per year    Active Member of [employer] or Organizations: Yes    Attends Banker Meetings: More than 4 times per  year    Marital Status: Widowed    Tobacco Counseling Counseling given: Not Answered   Clinical Intake:  Pre-visit preparation completed: Yes  Pain : No/denies pain     BMI - recorded: 30.09 Nutritional Status: BMI > 30  Obese Nutritional Risks: None Diabetes: Yes CBG done?: No Did pt. bring in CBG monitor from home?: No  How often do you need to have someone help you when you read instructions, pamphlets, or other written materials from your doctor or pharmacy?: 1 - Never  Interpreter Needed?: No  Information entered by :: Tora Kindred, CMA   Activities of Daily Living    01/07/2023    5:59 PM  In your present state of health, do you have any difficulty performing the following activities:  Hearing? 0  Vision? 0  Difficulty concentrating or making decisions? 0  Walking or climbing stairs? 0  Dressing or bathing? 0  Doing errands, shopping? 0  Preparing Food and eating ? N  Using the Toilet? N  In the past six months, have you accidently leaked urine? Y  Comment wears a panty liner  Do you have problems with loss of bowel control? N  Managing your Medications? N  Managing your Finances? N  Housekeeping or managing your Housekeeping? N    Patient Care Team: Bradd Canary, MD as PCP - General (Family Medicine) Thomasene Ripple, DO as PCP - Cardiology  (Cardiology) Ellis Parents, OD as Consulting Physician [facility]  Indicate any recent Medical Services you may have received from other than [facility] providers in the past year (date may be approximate).     Assessment:   This is a routine wellness examination for Marlyss.  Hearing/Vision screen Hearing Screening - Comments:: Denies hearing loss  Dietary issues and exercise activities discussed:     Goals Addressed               This Visit's Progress     Patient Stated (pt-stated)        Exercise more      Depression Screen    01/08/2023    9:40 AM 05/06/2022    9:39 AM 01/06/2022    3:25 PM 10/10/2021    9:32 AM 03/14/2021    2:08 PM 11/29/2020    3:18 PM 11/19/2020    3:56 PM  PHQ 2/9 Scores  PHQ - 2 Score 0 1 0 0 0 0 0  PHQ- 9 Score 0 1   4      Fall Risk    01/07/2023    5:59 PM 05/06/2022    9:38 AM 01/06/2022    3:24 PM 10/10/2021    9:32 AM 11/29/2020    3:18 PM  Fall Risk   Falls in the past year? 0 0 0 0 0  Number falls in past yr: 0 0 0 0 0  Injury with Fall? 0 0 0 0 0  Risk for fall due to : No Fall Risks   No Fall Risks   Follow up Falls prevention discussed Falls evaluation completed Falls prevention discussed Falls evaluation completed     MEDICARE RISK AT HOME:   TIMED UP AND GO:  Was the test performed?  No    Cognitive Function:    04/17/2016    3:02 PM  MMSE - Mini Mental State Exam  Orientation to time 5  Orientation to Place 5  Registration 3  Attention/ Calculation 5  Recall 3  Language- name 2 objects 2  Language- repeat 1  Language- follow 3 step command 3  Language- read & follow direction 1  Write a sentence 1  Copy design 1  Total score 30        01/08/2023    9:43 AM  6CIT Screen  What Year? 0 points  What month? 0 points  What time? 0 points  Count back from 20 0 points  Months in reverse 0 points  Repeat phrase 0 points  Total Score 0 points    Immunizations Immunization History   Administered Date(s) Administered   COVID-19, mRNA, vaccine(Comirnaty)12 years and older 05/06/2022   Fluad Quad(high Dose 65+) 03/14/2021, 05/06/2022   Influenza, High Dose Seasonal PF 04/17/2016, 06/04/2017, 07/09/2018, 03/06/2020   Influenza,inj,Quad PF,6+ Mos 04/11/2014, 05/23/2015   PFIZER Comirnaty(Gray Top)Covid-19 Tri-Sucrose Vaccine 02/01/2021   PFIZER(Purple Top)SARS-COV-2 Vaccination 08/09/2019, 08/30/2019, 05/16/2020    TDAP status: Due, Education has been provided regarding the importance of this vaccine. Advised may receive this vaccine at local pharmacy or Health Dept. Aware to provide a copy of the vaccination record if obtained from local pharmacy or Health Dept. Verbalized acceptance and understanding.  Flu Vaccine status: Up to date  Pneumococcal vaccine status: Declined,  Education has been provided regarding the importance of this vaccine but patient still declined. Advised may receive this vaccine at local pharmacy or Health Dept. Aware to provide a copy of the vaccination record if obtained from local pharmacy or Health Dept. Verbalized acceptance and understanding.   Covid-19 vaccine status: Declined, Education has been provided regarding the importance of this vaccine but patient still declined. Advised may receive this vaccine at local pharmacy or Health Dept.or vaccine clinic. Aware to provide a copy of the vaccination record if obtained from local pharmacy or Health Dept. Verbalized acceptance and understanding.  Qualifies for Shingles Vaccine? Yes   Zostavax completed No   Shingrix Completed?: No.    Education has been provided regarding the importance of this vaccine. Patient has been advised to call insurance company to determine out of pocket expense if they have not yet received this vaccine. Advised may also receive vaccine at local pharmacy or Health Dept. Verbalized acceptance and understanding.  Screening Tests Health Maintenance  Topic Date Due    DTaP/Tdap/Td (1 - Tdap) Never done   Zoster Vaccines- Shingrix (1 of 2) Never done   Pneumonia Vaccine 58+ Years old (1 of 1 - PCV) Never done   COVID-19 Vaccine (6 - 2023-24 season) 09/04/2022   INFLUENZA VACCINE  01/15/2023   OPHTHALMOLOGY EXAM  04/18/2023   FOOT EXAM  05/07/2023   MAMMOGRAM  06/03/2023   HEMOGLOBIN A1C  06/11/2023   Diabetic kidney evaluation - eGFR measurement  12/10/2023   Diabetic kidney evaluation - Urine ACR  12/10/2023   Medicare Annual Wellness (AWV)  01/08/2024   DEXA SCAN  06/02/2024   Colonoscopy  03/28/2031   Hepatitis C Screening  Completed   HPV VACCINES  Aged Out    Health Maintenance  Health Maintenance Due  Topic Date Due   DTaP/Tdap/Td (1 - Tdap) Never done   Zoster Vaccines- Shingrix (1 of 2) Never done   Pneumonia Vaccine 46+ Years old (1 of 1 - PCV) Never done   COVID-19 Vaccine (6 - 2023-24 season) 09/04/2022    Colorectal cancer screening: Type of screening: Colonoscopy. Completed 03/27/21. Repeat every 10 years  Mammogram status: Completed 06/02/22. Repeat every year  Bone Density status: Completed 06/02/22. Results reflect: Bone density results: OSTEOPENIA. Repeat every 2 years.  Lung Cancer Screening: (Low Dose CT Chest recommended if Age 81-80 years, 20 pack-year currently smoking OR have quit w/in 15years.) does not qualify.   Lung Cancer Screening Referral: n/a  Additional Screening:  Hepatitis C Screening: does qualify; Completed 02/02/18  Vision Screening: Recommended annual ophthalmology exams for early detection of glaucoma and other disorders of the eye. Is the patient up to date with their annual eye exam?  Yes  Who is the provider or what is the name of the office in which the patient attends annual eye exams? Dr. Ellis Parents @ [facility] If pt is not established with a provider, would they like to be referred to a provider to establish care? No .   Dental Screening: Recommended annual dental exams for proper  oral hygiene  Diabetic Foot Exam: Diabetic Foot Exam: Completed 05/06/22  Community Resource Referral / Chronic Care Management: CRR required this visit?  No   CCM required this visit?  No     Plan:     I have personally reviewed and noted the following in the patient's chart:   Medical and social history Use of alcohol, tobacco or illicit drugs  Current medications and supplements including opioid prescriptions. Patient is not currently taking opioid prescriptions. Functional ability and status Nutritional status Physical activity Advanced directives List of other physicians Hospitalizations, surgeries, and ER visits in previous 12 months Vitals Screenings to include cognitive, depression, and falls Referrals and appointments  In addition, I have reviewed and discussed with patient certain preventive protocols, quality metrics, and best practice recommendations. A written personalized care plan for preventive services as well as general preventive health recommendations were provided to patient.     Tora Kindred, CMA   01/08/2023   After Visit Summary: (MyChart) Due to this being a telephonic visit, the after visit summary with patients personalized plan was offered to patient via MyChart   Nurse Notes:  Patient due for Tdap. Patient declined Shigrix and Pneumovax.

## 2023-01-08 NOTE — Patient Instructions (Signed)
Morgan Medina , Thank you for taking time to come for your Medicare Wellness Visit. I appreciate your ongoing commitment to your health goals. Please review the following plan we discussed and let me know if I can assist you in the future.   Referrals/Orders/Follow-Ups/Clinician Recommendations: Recommend you get an updated tetanus shot. Keep up the good work!  This is a list of the screening recommended for you and due dates:  Health Maintenance  Topic Date Due   DTaP/Tdap/Td vaccine (1 - Tdap) Never done   Zoster (Shingles) Vaccine (1 of 2) Never done   Pneumonia Vaccine (1 of 1 - PCV) Never done   COVID-19 Vaccine (6 - 2023-24 season) 09/04/2022   Flu Shot  01/15/2023   Eye exam for diabetics  04/18/2023   Complete foot exam   05/07/2023   Mammogram  06/03/2023   Hemoglobin A1C  06/11/2023   Yearly kidney function blood test for diabetes  12/10/2023   Yearly kidney health urinalysis for diabetes  12/10/2023   Medicare Annual Wellness Visit  01/08/2024   DEXA scan (bone density measurement)  06/02/2024   Colon Cancer Screening  03/28/2031   Hepatitis C Screening  Completed   HPV Vaccine  Aged Out    Advanced directives: (Provided) Advance directive discussed with you today. I have provided a copy for you to complete at home and have notarized. Once this is complete, please bring a copy in to our office so we can scan it into your chart.   Next Medicare Annual Wellness Visit scheduled for next year: Yes  Preventive Care 75 Years and Older, Female Preventive care refers to lifestyle choices and visits with your health care provider that can promote health and wellness. What does preventive care include? A yearly physical exam. This is also called an annual well check. Dental exams once or twice a year. Routine eye exams. Ask your health care provider how often you should have your eyes checked. Personal lifestyle choices, including: Daily care of your teeth and gums. Regular  physical activity. Eating a healthy diet. Avoiding tobacco and drug use. Limiting alcohol use. Practicing safe sex. Taking low-dose aspirin every day. Taking vitamin and mineral supplements as recommended by your health care provider. What happens during an annual well check? The services and screenings done by your health care provider during your annual well check will depend on your age, overall health, lifestyle risk factors, and family history of disease. Counseling  Your health care provider may ask you questions about your: Alcohol use. Tobacco use. Drug use. Emotional well-being. Home and relationship well-being. Sexual activity. Eating habits. History of falls. Memory and ability to understand (cognition). Work and work Astronomer. Reproductive health. Screening  You may have the following tests or measurements: Height, weight, and BMI. Blood pressure. Lipid and cholesterol levels. These may be checked every 5 years, or more frequently if you are over 52 years old. Skin check. Lung cancer screening. You may have this screening every year starting at age 57 if you have a 30-pack-year history of smoking and currently smoke or have quit within the past 15 years. Fecal occult blood test (FOBT) of the stool. You may have this test every year starting at age 43. Flexible sigmoidoscopy or colonoscopy. You may have a sigmoidoscopy every 5 years or a colonoscopy every 10 years starting at age 19. Hepatitis C blood test. Hepatitis B blood test. Sexually transmitted disease (STD) testing. Diabetes screening. This is done by checking your blood sugar (glucose)  after you have not eaten for a while (fasting). You may have this done every 1-3 years. Bone density scan. This is done to screen for osteoporosis. You may have this done starting at age 34. Mammogram. This may be done every 1-2 years. Talk to your health care provider about how often you should have regular mammograms. Talk  with your health care provider about your test results, treatment options, and if necessary, the need for more tests. Vaccines  Your health care provider may recommend certain vaccines, such as: Influenza vaccine. This is recommended every year. Tetanus, diphtheria, and acellular pertussis (Tdap, Td) vaccine. You may need a Td booster every 10 years. Zoster vaccine. You may need this after age 39. Pneumococcal 13-valent conjugate (PCV13) vaccine. One dose is recommended after age 76. Pneumococcal polysaccharide (PPSV23) vaccine. One dose is recommended after age 46. Talk to your health care provider about which screenings and vaccines you need and how often you need them. This information is not intended to replace advice given to you by your health care provider. Make sure you discuss any questions you have with your health care provider. Document Released: 06/29/2015 Document Revised: 02/20/2016 Document Reviewed: 04/03/2015 Elsevier Interactive Patient Education  2017 [employer].  Fall Prevention in the Home Falls can cause injuries. They can happen to people of all ages. There are many things you can do to make your home safe and to help prevent falls. What can I do on the outside of my home? Regularly fix the edges of walkways and driveways and fix any cracks. Remove anything that might make you trip as you walk through a door, such as a raised step or threshold. Trim any bushes or trees on the path to your home. Use bright outdoor lighting. Clear any walking paths of anything that might make someone trip, such as rocks or tools. Regularly check to see if handrails are loose or broken. Make sure that both sides of any steps have handrails. Any raised decks and porches should have guardrails on the edges. Have any leaves, snow, or ice cleared regularly. Use sand or salt on walking paths during winter. Clean up any spills in your garage right away. This includes oil or grease  spills. What can I do in the bathroom? Use night lights. Install grab bars by the toilet and in the tub and shower. Do not use towel bars as grab bars. Use non-skid mats or decals in the tub or shower. If you need to sit down in the shower, use a plastic, non-slip stool. Keep the floor dry. Clean up any water that spills on the floor as soon as it happens. Remove soap buildup in the tub or shower regularly. Attach bath mats securely with double-sided non-slip rug tape. Do not have throw rugs and other things on the floor that can make you trip. What can I do in the bedroom? Use night lights. Make sure that you have a light by your bed that is easy to reach. Do not use any sheets or blankets that are too big for your bed. They should not hang down onto the floor. Have a firm chair that has side arms. You can use this for support while you get dressed. Do not have throw rugs and other things on the floor that can make you trip. What can I do in the kitchen? Clean up any spills right away. Avoid walking on wet floors. Keep items that you use a lot in easy-to-reach places. If  you need to reach something above you, use a strong step stool that has a grab bar. Keep electrical cords out of the way. Do not use floor polish or wax that makes floors slippery. If you must use wax, use non-skid floor wax. Do not have throw rugs and other things on the floor that can make you trip. What can I do with my stairs? Do not leave any items on the stairs. Make sure that there are handrails on both sides of the stairs and use them. Fix handrails that are broken or loose. Make sure that handrails are as long as the stairways. Check any carpeting to make sure that it is firmly attached to the stairs. Fix any carpet that is loose or worn. Avoid having throw rugs at the top or bottom of the stairs. If you do have throw rugs, attach them to the floor with carpet tape. Make sure that you have a light switch at the  top of the stairs and the bottom of the stairs. If you do not have them, ask someone to add them for you. What else can I do to help prevent falls? Wear shoes that: Do not have high heels. Have rubber bottoms. Are comfortable and fit you well. Are closed at the toe. Do not wear sandals. If you use a stepladder: Make sure that it is fully opened. Do not climb a closed stepladder. Make sure that both sides of the stepladder are locked into place. Ask someone to hold it for you, if possible. Clearly mark and make sure that you can see: Any grab bars or handrails. First and last steps. Where the edge of each step is. Use tools that help you move around (mobility aids) if they are needed. These include: Canes. Walkers. Scooters. Crutches. Turn on the lights when you go into a dark area. Replace any light bulbs as soon as they burn out. Set up your furniture so you have a clear path. Avoid moving your furniture around. If any of your floors are uneven, fix them. If there are any pets around you, be aware of where they are. Review your medicines with your doctor. Some medicines can make you feel dizzy. This can increase your chance of falling. Ask your doctor what other things that you can do to help prevent falls. This information is not intended to replace advice given to you by your health care provider. Make sure you discuss any questions you have with your health care provider. Document Released: 03/29/2009 Document Revised: 11/08/2015 Document Reviewed: 07/07/2014 Elsevier Interactive Patient Education  2017 [employer].

## 2023-01-09 ENCOUNTER — Other Ambulatory Visit: Payer: Self-pay | Admitting: Family Medicine

## 2023-01-26 ENCOUNTER — Encounter: Payer: Self-pay | Admitting: Family Medicine

## 2023-01-26 ENCOUNTER — Ambulatory Visit: Payer: Medicare PPO | Admitting: Family Medicine

## 2023-01-26 VITALS — BP 144/58 | HR 66 | Ht 67.5 in | Wt 196.0 lb

## 2023-01-26 DIAGNOSIS — R600 Localized edema: Secondary | ICD-10-CM

## 2023-01-26 NOTE — Progress Notes (Signed)
Acute Office Visit  Subjective:     Patient ID: Morgan Medina, female    DOB: 22-Feb-1946, 77 y.o.   MRN: [id number]  Chief Complaint  Patient presents with   Leg Swelling    HPI Patient is in today for bilateral lower leg edema.   Discussed the use of AI scribe software for clinical note transcription with the patient, who gave verbal consent to proceed.  History of Present Illness   The patient presents with bilateral lower extremity edema, more pronounced on the right side (reports history of ortho injury to this side causing the swelling to be worse at baseline), which has been ongoing for a couple of weeks. The swelling is minimal in the morning but worsens throughout the day, becoming uncomfortable and causing soreness. Despite the swelling, the patient is able to move their toes without difficulty and denies any associated rashes.   They deny any new onset of chest pain, shortness of breath, or cough. The patient is currently on a regimen of Lasix 40 mg, alternating between once and twice daily, and potassium supplementation on the days when Lasix is taken twice.  The patient has been attempting to manage the edema by elevating their legs when seated, although not to the level of the heart. They also report wearing compression socks only occasionally. The patient acknowledges a diet likely high in sodium due to consumption of processed foods, but denies adding salt to meals.             ROS All review of systems negative except what is listed in the HPI      Objective:    BP (!) 144/58   Pulse 66   Ht 5' 7.5" (1.715 m)   Wt 196 lb (88.9 kg)   SpO2 99%   BMI 30.24 kg/m    Physical Exam Vitals reviewed.  Constitutional:      Appearance: Normal appearance.  Cardiovascular:     Rate and Rhythm: Normal rate and regular rhythm.     Pulses: Normal pulses.     Heart sounds: Normal heart sounds.  Pulmonary:     Effort: Pulmonary effort is normal.     Breath  sounds: Normal breath sounds.  Musculoskeletal:     Right lower leg: Edema present.     Left lower leg: Edema present.  Skin:    General: Skin is warm and dry.     Capillary Refill: Capillary refill takes less than 2 seconds.     Findings: No erythema.  Neurological:     Mental Status: She is alert and oriented to person, place, and time.  Psychiatric:        Mood and Affect: Mood normal.        Behavior: Behavior normal.        Thought Content: Thought content normal.        Judgment: Judgment normal.     No results found for any visits on 01/26/23.      Assessment & Plan:   Problem List Items Addressed This Visit     Bilateral leg edema - Primary   Relevant Orders   Comprehensive metabolic panel   Brain natriuretic peptide   Urinalysis, Routine w reflex microscopic      Bilateral Lower Extremity Edema New onset, worse in the evening, with associated discomfort. No signs of deep vein thrombosis. Possible contributing factors include recent right ankle sprain and high sodium intake. No associated dyspnea, chest pain, or new cough. -Order labs  today -Advise patient to reduce sodium intake. -Recommend patient to wear compression socks, especially applying in the morning before ambulation. -Encourage patient to elevate legs to heart level when seated.   Please contact office for follow-up if symptoms do not improve or worsen. Seek emergency care if symptoms become severe     No orders of the defined types were placed in this encounter.   Return if symptoms worsen or fail to improve, for (pending results).  Clayborne Dana, NP

## 2023-02-05 ENCOUNTER — Other Ambulatory Visit: Payer: Self-pay | Admitting: Pharmacist

## 2023-02-05 ENCOUNTER — Encounter: Payer: Self-pay | Admitting: Pharmacist

## 2023-02-05 MED ORDER — POTASSIUM CHLORIDE CRYS ER 20 MEQ PO TBCR: 20.0000 meq | EXTENDED_RELEASE_TABLET | ORAL | 1 refills | Status: DC

## 2023-02-05 NOTE — Progress Notes (Signed)
Pharmacy Quality Measure Review  This patient is appearing on a report for being at risk of failing the adherence measure for cholesterol (statin) and diabetes medications this calendar year.   Medication: rosuvastatin  Last fill date: 10/25/2022 for 90 day supply   Medication: Rybelsus 3mg  Last fill date: 12/10/2022 for 30 day supply per report. Patient actually filled prescription 01/14/2023 for 30 DS  Reviewed med list and rosuvastatin was removed from her list 01/26/2023.  Called patient to see if she stopped due to adverse effect. Patient states she would prefer not to take statin medications. She is aware of risk associated with elevated cholesterol - last LDL was 193.   Patient reports she is out of potassium supplement - she taken 20 mEq every OTHER day along with furosemide 40mg  1 or 2 tablets daily (1 tablet on odd days and 2 tablets on even days)  Serum potassium was 3.6 on 01/26/2023  Rx for Potassium sent to pharmacy.    Henrene Pastor, PharmD Clinical Pharmacist [facility]  Population Health [phone]

## 2023-02-25 ENCOUNTER — Other Ambulatory Visit: Payer: Self-pay | Admitting: Family Medicine

## 2023-03-27 ENCOUNTER — Other Ambulatory Visit: Payer: Self-pay | Admitting: Family Medicine

## 2023-04-11 ENCOUNTER — Other Ambulatory Visit: Payer: Self-pay | Admitting: Family Medicine

## 2023-04-23 DIAGNOSIS — E119 Type 2 diabetes mellitus without complications: Secondary | ICD-10-CM | POA: Diagnosis not present

## 2023-04-23 DIAGNOSIS — H53022 Refractive amblyopia, left eye: Secondary | ICD-10-CM | POA: Diagnosis not present

## 2023-04-23 DIAGNOSIS — Z961 Presence of intraocular lens: Secondary | ICD-10-CM | POA: Diagnosis not present

## 2023-04-23 LAB — HM DIABETES EYE EXAM

## 2023-05-06 ENCOUNTER — Other Ambulatory Visit: Payer: Self-pay | Admitting: Family Medicine

## 2023-05-06 DIAGNOSIS — E1100 Type 2 diabetes mellitus with hyperosmolarity without nonketotic hyperglycemic-hyperosmolar coma (NKHHC): Secondary | ICD-10-CM

## 2023-05-18 ENCOUNTER — Telehealth: Payer: Self-pay | Admitting: Family Medicine

## 2023-05-18 DIAGNOSIS — E1100 Type 2 diabetes mellitus with hyperosmolarity without nonketotic hyperglycemic-hyperosmolar coma (NKHHC): Secondary | ICD-10-CM

## 2023-05-18 MED ORDER — RYBELSUS 3 MG PO TABS: 1.0000 | ORAL_TABLET | Freq: Every day | ORAL | 2 refills | Status: DC

## 2023-05-18 NOTE — Addendum Note (Signed)
Addended by: Thelma Barge D on: 05/18/2023 03:33 PM   Modules accepted: Orders

## 2023-05-18 NOTE — Telephone Encounter (Signed)
Pt said that her pharmacy needs PCP to update the Rybelsus prescription. Pt said it needs to be for 30 days instead of 90 days as the 90 day supply costs too much. Please send to [facility] [address].

## 2023-05-18 NOTE — Telephone Encounter (Signed)
Pt notified new rx for 30tabs sent in.  Also advised pt that if 90 day does get sent in she can request them to fill only 30 tabs.

## 2023-06-18 ENCOUNTER — Ambulatory Visit: Payer: Medicare PPO | Admitting: Family Medicine

## 2023-06-23 ENCOUNTER — Ambulatory Visit (INDEPENDENT_AMBULATORY_CARE_PROVIDER_SITE_OTHER): Payer: Medicare PPO | Admitting: Family Medicine

## 2023-06-23 ENCOUNTER — Encounter: Payer: Self-pay | Admitting: Family Medicine

## 2023-06-23 VITALS — BP 162/70 | HR 60 | Ht 67.5 in | Wt 192.0 lb

## 2023-06-23 DIAGNOSIS — Z7984 Long term (current) use of oral hypoglycemic drugs: Secondary | ICD-10-CM | POA: Diagnosis not present

## 2023-06-23 DIAGNOSIS — E559 Vitamin D deficiency, unspecified: Secondary | ICD-10-CM

## 2023-06-23 DIAGNOSIS — D508 Other iron deficiency anemias: Secondary | ICD-10-CM | POA: Diagnosis not present

## 2023-06-23 DIAGNOSIS — E1100 Type 2 diabetes mellitus with hyperosmolarity without nonketotic hyperglycemic-hyperosmolar coma (NKHHC): Secondary | ICD-10-CM

## 2023-06-23 DIAGNOSIS — I10 Essential (primary) hypertension: Secondary | ICD-10-CM | POA: Diagnosis not present

## 2023-06-23 DIAGNOSIS — Z1231 Encounter for screening mammogram for malignant neoplasm of breast: Secondary | ICD-10-CM

## 2023-06-23 DIAGNOSIS — E782 Mixed hyperlipidemia: Secondary | ICD-10-CM

## 2023-06-23 LAB — CBC WITH DIFFERENTIAL/PLATELET
Basophils Absolute: 0 10*3/uL (ref 0.0–0.1)
Basophils Relative: 0.3 % (ref 0.0–3.0)
Eosinophils Absolute: 0.1 10*3/uL (ref 0.0–0.7)
Eosinophils Relative: 1.2 % (ref 0.0–5.0)
HCT: 37.2 % (ref 36.0–46.0)
Hemoglobin: 11.7 g/dL — ABNORMAL LOW (ref 12.0–15.0)
Lymphocytes Relative: 29.8 % (ref 12.0–46.0)
Lymphs Abs: 2.4 10*3/uL (ref 0.7–4.0)
MCHC: 31.4 g/dL (ref 30.0–36.0)
MCV: 75.9 fL — ABNORMAL LOW (ref 78.0–100.0)
Monocytes Absolute: 0.5 10*3/uL (ref 0.1–1.0)
Monocytes Relative: 6.5 % (ref 3.0–12.0)
Neutro Abs: 5 10*3/uL (ref 1.4–7.7)
Neutrophils Relative %: 62.2 % (ref 43.0–77.0)
Platelets: 289 10*3/uL (ref 150.0–400.0)
RBC: 4.9 Mil/uL (ref 3.87–5.11)
RDW: 13.8 % (ref 11.5–15.5)
WBC: 8.1 10*3/uL (ref 4.0–10.5)

## 2023-06-23 LAB — IBC + FERRITIN
Ferritin: 498.5 ng/mL — ABNORMAL HIGH (ref 10.0–291.0)
Iron: 88 ug/dL (ref 42–145)
Saturation Ratios: 26.5 % (ref 20.0–50.0)
TIBC: 331.8 ug/dL (ref 250.0–450.0)
Transferrin: 237 mg/dL (ref 212.0–360.0)

## 2023-06-23 LAB — COMPREHENSIVE METABOLIC PANEL
ALT: 11 U/L (ref 0–35)
AST: 14 U/L (ref 0–37)
Albumin: 4.3 g/dL (ref 3.5–5.2)
Alkaline Phosphatase: 66 U/L (ref 39–117)
BUN: 11 mg/dL (ref 6–23)
CO2: 33 meq/L — ABNORMAL HIGH (ref 19–32)
Calcium: 9.7 mg/dL (ref 8.4–10.5)
Chloride: 100 meq/L (ref 96–112)
Creatinine, Ser: 0.73 mg/dL (ref 0.40–1.20)
GFR: 80.23 mL/min (ref >60.00)
Glucose, Bld: 134 mg/dL — ABNORMAL HIGH (ref 70–99)
Potassium: 3.7 meq/L (ref 3.5–5.1)
Sodium: 143 meq/L (ref 135–145)
Total Bilirubin: 0.4 mg/dL (ref 0.2–1.2)
Total Protein: 7.4 g/dL (ref 6.0–8.3)

## 2023-06-23 LAB — VITAMIN D 25 HYDROXY (VIT D DEFICIENCY, FRACTURES): VITD: 25.65 ng/mL — ABNORMAL LOW (ref 30.00–100.00)

## 2023-06-23 LAB — HEMOGLOBIN A1C: Hgb A1c MFr Bld: 7.4 % — ABNORMAL HIGH (ref 4.6–6.5)

## 2023-06-23 LAB — LIPID PANEL
Cholesterol: 293 mg/dL — ABNORMAL HIGH (ref 0–200)
HDL: 50.3 mg/dL (ref >39.00)
LDL Cholesterol: 213 mg/dL — ABNORMAL HIGH (ref 0–99)
NonHDL: 242.85
Total CHOL/HDL Ratio: 6
Triglycerides: 151 mg/dL — ABNORMAL HIGH (ref 0.0–149.0)
VLDL: 30.2 mg/dL (ref 0.0–40.0)

## 2023-06-23 LAB — TSH: TSH: 3.92 u[IU]/mL (ref 0.35–5.50)

## 2023-06-23 MED ORDER — RYBELSUS 3 MG PO TABS: 1.0000 | ORAL_TABLET | Freq: Every day | ORAL | 2 refills | Status: DC

## 2023-06-23 NOTE — Assessment & Plan Note (Signed)
 Supplement and monitor

## 2023-06-23 NOTE — Assessment & Plan Note (Signed)
 Continue statin.  Labs today.

## 2023-06-23 NOTE — Assessment & Plan Note (Signed)
 Blood pressure is not at goal for age and co-morbidities.   Recommendations: continue current meds; nurse visit in 2 weeks to recheck (make sure you take your meds at least 1 hour before appointment  - BP goal <130/80 - monitor and log blood pressures at home - check around the same time each day in a relaxed setting - Limit salt to <2000 mg/day - Follow DASH eating plan (heart healthy diet) - limit alcohol to 2 standard drinks per day for men and 1 per day for women - avoid tobacco products - get at least 2 hours of regular aerobic exercise weekly Patient aware of signs/symptoms requiring further/urgent evaluation. Labs updated today.

## 2023-06-23 NOTE — Progress Notes (Signed)
 Established Patient Office Visit  Subjective   Patient ID: Morgan Medina, female    DOB: 12-07-1945  Age: 78 y.o. MRN: [id number]  Chief Complaint  Patient presents with   Medical Management of Chronic Issues    HPI  Discussed the use of AI scribe software for clinical note transcription with the patient, who gave verbal consent to proceed.  History of Present Illness   The patient, with a history of hypertension, heart failure, and diabetes, presents for a routine six-month follow-up. They report adherence to their medication regimen, which includes amlodipine , Coreg , Lasix , losartan , Pepcid , iron, potassium, and vitamin D . However, they have been out of Rybelsus  due to a pharmacy miscommunication. They deny any symptoms of hyperglycemia or hypoglycemia, and have not been monitoring their blood glucose levels at home recently.  The patient denies any new or worsening symptoms of chest pain or shortness of breath. They report baseline ankle swelling with no recent exacerbation. Their weight has been stable, with a noted decrease since their last visit in August.  They have not been monitoring their blood pressure at home recently, but it was noted to be high during the current visit, prior to taking their morning medications.  The patient also reports a need for a new mammogram order, as they are due for their annual screening. They deny any sores or wounds on their feet.          Wt Readings from Last 3 Encounters:  06/23/23 192 lb (87.1 kg)  01/26/23 196 lb (88.9 kg)  01/08/23 195 lb (88.5 kg)          ROS All review of systems negative except what is listed in the HPI    Objective:     BP (!) 162/70   Pulse 60   Ht 5' 7.5 (1.715 m)   Wt 192 lb (87.1 kg)   SpO2 98%   BMI 29.63 kg/m    Physical Exam Vitals reviewed.  Constitutional:      Appearance: Normal appearance.  Cardiovascular:     Rate and Rhythm: Normal rate and regular rhythm.     Pulses:  Normal pulses.     Heart sounds: Normal heart sounds.  Pulmonary:     Effort: Pulmonary effort is normal.     Breath sounds: Normal breath sounds.  Musculoskeletal:     Right lower leg: No edema.     Left lower leg: No edema.  Skin:    General: Skin is warm and dry.  Neurological:     Mental Status: She is alert and oriented to person, place, and time.  Psychiatric:        Mood and Affect: Mood normal.        Behavior: Behavior normal.        Thought Content: Thought content normal.        Judgment: Judgment normal.    Diabetic foot exam was performed.  No deformities or other abnormal visual findings.  Posterior tibialis and dorsalis pulse intact bilaterally.  Intact to touch and monofilament testing bilaterally.    No results found for any visits on 06/23/23.    The 10-year ASCVD risk score (Arnett DK, et al., 2019) is: 51.8%    Assessment & Plan:   Problem List Items Addressed This Visit       Active Problems   Anemia - Primary   Asymptomatic.  Labs today       Relevant Orders   CBC with Differential/Platelet   IBC +  Ferritin   HTN (hypertension)   Blood pressure is not at goal for age and co-morbidities.   Recommendations: continue current meds; nurse visit in 2 weeks to recheck (make sure you take your meds at least 1 hour before appointment  - BP goal <130/80 - monitor and log blood pressures at home - check around the same time each day in a relaxed setting - Limit salt to <2000 mg/day - Follow DASH eating plan (heart healthy diet) - limit alcohol to 2 standard drinks per day for men and 1 per day for women - avoid tobacco products - get at least 2 hours of regular aerobic exercise weekly Patient aware of signs/symptoms requiring further/urgent evaluation. Labs updated today.      Relevant Orders   CBC with Differential/Platelet   Comprehensive metabolic panel   Lipid panel   TSH   Hyperlipidemia   Continue statin. Labs today       Relevant  Orders   Comprehensive metabolic panel   Lipid panel   Vitamin D  deficiency   Supplement and monitor      Relevant Orders   VITAMIN D  25 Hydroxy (Vit-D Deficiency, Fractures)   Type 2 diabetes mellitus with hyperosmolarity without coma, without long-term current use of insulin (HCC)   Patient has been out of Rybelsus . No recent self-monitoring of blood glucose. No symptoms of hyperglycemia or hypoglycemia reported. -Resend Rybelsus   -Encourage patient to resume self-monitoring of blood glucose. -Recommended low-carb, heart healthy diet and regular exercise          Relevant Medications   Semaglutide  (RYBELSUS ) 3 MG TABS   Other Relevant Orders   Comprehensive metabolic panel   Hemoglobin A1c   Other Visit Diagnoses       Encounter for screening mammogram for malignant neoplasm of breast       Relevant Orders   MM 3D SCREENING MAMMOGRAM BILATERAL BREAST       Return in about 6 months (around 12/21/2023) for routine follow-up/CPE; 2 week nurse visit BP check .    Waddell KATHEE Mon, NP

## 2023-06-23 NOTE — Assessment & Plan Note (Signed)
 Asymptomatic.  Labs today

## 2023-06-23 NOTE — Patient Instructions (Addendum)
 Blood pressure is not at goal for age and co-morbidities.   Recommendations: continue current meds; nurse visit in 2 weeks to recheck (make sure you take your meds at least 1 hour before appointment  - BP goal <130/80 - monitor and log blood pressures at home - check around the same time each day in a relaxed setting - Limit salt to <2000 mg/day - Follow DASH eating plan (heart healthy diet) - limit alcohol to 2 standard drinks per day for men and 1 per day for women - avoid tobacco products - get at least 2 hours of regular aerobic exercise weekly Patient aware of signs/symptoms requiring further/urgent evaluation. Labs updated today.

## 2023-06-23 NOTE — Assessment & Plan Note (Signed)
 Patient has been out of Rybelsus . No recent self-monitoring of blood glucose. No symptoms of hyperglycemia or hypoglycemia reported. -Resend Rybelsus   -Encourage patient to resume self-monitoring of blood glucose. -Recommended low-carb, heart healthy diet and regular exercise

## 2023-06-24 NOTE — Progress Notes (Signed)
 A1c increased slightly - restart your Rybelsus  as we discussed at appointment, continue low-carb diet and regular exercise.  Cholesterol/triglycerides have increased slightly - given overall risk, highly recommend starting cholesterol medication. If agreeable, start Crestor  20 mg daily.  Vitamin D  is a little low again - increase daily supplement to 2,000 units daily.  PCP follow-up in 3 months.    The 10-year ASCVD risk score (Arnett DK, et al., 2019) is: 54.5%   Values used to calculate the score:     Age: 78 years     Sex: Female     Is Non-Hispanic African American: Yes     Diabetic: Yes     Tobacco smoker: No     Systolic Blood Pressure: 162 mmHg     Is BP treated: Yes     HDL Cholesterol: 50.3 mg/dL     Total Cholesterol: 293 mg/dL

## 2023-06-30 ENCOUNTER — Telehealth (HOSPITAL_BASED_OUTPATIENT_CLINIC_OR_DEPARTMENT_OTHER): Payer: Self-pay

## 2023-07-08 ENCOUNTER — Ambulatory Visit: Payer: Medicare PPO

## 2023-08-19 DIAGNOSIS — J069 Acute upper respiratory infection, unspecified: Secondary | ICD-10-CM | POA: Diagnosis not present

## 2023-09-19 ENCOUNTER — Other Ambulatory Visit: Payer: Self-pay | Admitting: Family Medicine

## 2023-10-10 ENCOUNTER — Other Ambulatory Visit: Payer: Self-pay | Admitting: Family Medicine

## 2023-10-13 ENCOUNTER — Other Ambulatory Visit: Payer: Self-pay | Admitting: Family Medicine

## 2023-10-13 NOTE — Telephone Encounter (Signed)
 Copied from CRM [id number]. Topic: Clinical - Medication Refill >> Oct 13, 2023  3:48 PM Morgan Medina wrote: Most Recent Primary Care Visit:  Provider: Everlina Hock  Department: [facility]  Visit Type: OFFICE VISIT  Date: 06/23/2023  Medication: carvedilol  (COREG ) 25 MG tablet  Has the patient contacted their pharmacy? Yes (Agent: If no, request that the patient contact the pharmacy for the refill. If patient does not wish to contact the pharmacy document the reason why and proceed with request.) (Agent: If yes, when and what did the pharmacy advise?)  Is this the correct pharmacy for this prescription? Yes If no, delete pharmacy and type the correct one.  This is the patient's preferred pharmacy:  [id number] - [place], [place] - [address] [place] [address] Phone: [phone] Fax: [phone]   Has the prescription been filled recently? Yes  Is the patient out of the medication? Yes  Has the patient been seen for an appointment in the last year OR does the patient have an upcoming appointment? Yes  Can we respond through MyChart? Yes  Agent: Please be advised that Rx refills may take up to 3 business days. We ask that you follow-up with your pharmacy.

## 2023-10-14 MED ORDER — CARVEDILOL 25 MG PO TABS: 25.0000 mg | ORAL_TABLET | Freq: Two times a day (BID) | ORAL | 0 refills | Status: DC

## 2023-11-06 ENCOUNTER — Other Ambulatory Visit: Payer: Self-pay | Admitting: Family Medicine

## 2023-11-06 DIAGNOSIS — I10 Essential (primary) hypertension: Secondary | ICD-10-CM

## 2023-12-04 ENCOUNTER — Other Ambulatory Visit: Payer: Self-pay | Admitting: Family Medicine

## 2023-12-04 DIAGNOSIS — I10 Essential (primary) hypertension: Secondary | ICD-10-CM

## 2023-12-08 ENCOUNTER — Other Ambulatory Visit: Payer: Self-pay | Admitting: Family Medicine

## 2023-12-08 DIAGNOSIS — I10 Essential (primary) hypertension: Secondary | ICD-10-CM

## 2023-12-12 ENCOUNTER — Other Ambulatory Visit: Payer: Self-pay | Admitting: Family Medicine

## 2024-01-27 ENCOUNTER — Encounter

## 2024-01-28 ENCOUNTER — Ambulatory Visit (INDEPENDENT_AMBULATORY_CARE_PROVIDER_SITE_OTHER): Admitting: *Deleted

## 2024-01-28 VITALS — Ht 67.5 in | Wt 192.0 lb

## 2024-01-28 DIAGNOSIS — M8580 Other specified disorders of bone density and structure, unspecified site: Secondary | ICD-10-CM

## 2024-01-28 DIAGNOSIS — Z0000 Encounter for general adult medical examination without abnormal findings: Secondary | ICD-10-CM

## 2024-01-28 DIAGNOSIS — Z1231 Encounter for screening mammogram for malignant neoplasm of breast: Secondary | ICD-10-CM | POA: Diagnosis not present

## 2024-01-28 NOTE — Progress Notes (Signed)
 Subjective:   Morgan Medina is a 78 y.o. who presents for a Medicare Wellness preventive visit.  As a reminder, Annual Wellness Visits don't include a physical exam, and some assessments may be limited, especially if this visit is performed virtually. We may recommend an in-person follow-up visit with your provider if needed.  Visit Complete: Virtual I connected with  Morgan Medina on 01/28/24 by a audio enabled telemedicine application and verified that I am speaking with the correct person using two identifiers.  Patient Location: Home  Provider Location: Office/Clinic  I discussed the limitations of evaluation and management by telemedicine. The patient expressed understanding and agreed to proceed.  Vital Signs: Because this visit was a virtual/telehealth visit, some criteria may be missing or patient reported. Any vitals not documented were not able to be obtained and vitals that have been documented are patient reported.  VideoDeclined- This patient declined Librarian, academic. Therefore the visit was completed with audio only.  Persons Participating in Visit: Patient.  AWV Questionnaire: No: Patient Medicare AWV questionnaire was not completed prior to this visit.  Cardiac Risk Factors include: advanced age (>77men, >73 women);diabetes mellitus;dyslipidemia;hypertension;Other (see comment), Risk factor comments: CAD, diastolic dysfunction     Objective:    Today's Vitals   01/28/24 1421  Weight: 192 lb (87.1 kg)  Height: 5' 7.5 (1.715 m)   Body mass index is 29.63 kg/m.     01/28/2024    2:29 PM 01/08/2023    9:42 AM 08/04/2022    2:56 PM 01/06/2022    3:23 PM 09/10/2021    9:28 PM 11/19/2020    3:54 PM 11/08/2019    9:40 AM  Advanced Directives  Does Patient Have a Medical Advance Directive? No No No No No No No  Would patient like information on creating a medical advance directive? No - Patient declined Yes (MAU/Ambulatory/Procedural  Areas - Information given) No - Patient declined   Yes (MAU/Ambulatory/Procedural Areas - Information given) No - Patient declined    Current Medications (verified) Outpatient Encounter Medications as of 01/28/2024  Medication Sig   amLODipine  (NORVASC ) 5 MG tablet Take 1 tablet (5 mg total) by mouth daily.   aspirin 81 MG tablet Takes every other day   Blood Glucose Monitoring Suppl (ONE TOUCH BASIC SYSTEM) W/DEVICE KIT Use to check blood sugar. DX. E11.9   carvedilol  (COREG ) 25 MG tablet Take 1 tablet (25 mg total) by mouth 2 (two) times daily with a meal.   famotidine  (PEPCID ) 40 MG tablet TAKE 1 TABLET BY MOUTH EVERY DAY   ferrous sulfate  325 (65 FE) MG tablet TAKE 1 TABLET BY MOUTH EVERY DAY WITH BREAKFAST   furosemide  (LASIX ) 40 MG tablet TAKE 1 TABLET (40 MG TOTAL) BY MOUTH 2 (TWO) TIMES DAILY. 40 MG TWICE DAILY EVERY OTHER DAY. ON THE ODD DAYS 40 MG ONCE DAILY.   losartan  (COZAAR ) 100 MG tablet TAKE 1 TABLET BY MOUTH EVERY EVENING.   Multiple Vitamin (MULTIVITAMIN) capsule Take 1 capsule by mouth daily.   ONE TOUCH LANCETS MISC Use as directed once daily to check blood sugar. DX: E11.9   potassium chloride  SA (KLOR-CON  M20) 20 MEQ tablet Take 1 tablet (20 mEq total) by mouth every other day.   RESTASIS 0.05 % ophthalmic emulsion INSTILL 1 DROP INTO BOTH EYES TWICE A DAY   Semaglutide  (RYBELSUS ) 3 MG TABS Take 1 tablet (3 mg total) by mouth daily.   Vitamin D , Cholecalciferol, 25 MCG (1000 UT) CAPS Take 1,000  Units by mouth daily.   No facility-administered encounter medications on file as of 01/28/2024.    Allergies (verified) Metformin and related   History: Past Medical History:  Diagnosis Date   Anemia    Cerebral aneurysm    h/o in 2006   Dacrocystitis 07/19/2015   right   Diabetes mellitus type 2 in obese 11/05/2014   GERD (gastroesophageal reflux disease) 04/17/2016   Hyperlipidemia    Hypertension    Osteoarthritis of left hip 12/02/2015   Osteopenia 11/22/2015    Overweight    Past Surgical History:  Procedure Laterality Date   ABDOMINAL HYSTERECTOMY     CEREBRAL ANEURYSM REPAIR     Family History  Problem Relation Age of Onset   Alzheimer's disease Mother    Cerebral aneurysm Father    Lupus Daughter    Cancer Maternal Aunt        breast   Diabetes Maternal Grandmother    Stroke Maternal Grandfather    Alzheimer's disease Paternal Grandmother    Stroke Paternal Grandfather    Social History   Socioeconomic History   Marital status: Widowed    Spouse name: Not on file   Number of children: 1   Years of education: Not on file   Highest education level: Master's degree (e.g., MA, MS, MEng, MEd, MSW, MBA)  Occupational History   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 works as a Education officer, environmental, retired from Management [employer]  Other Topics Concern   Not on file  Social History Narrative   1 daughter, 3 grandchildren and 2 Art gallery manager. Daughter and 1 granddaughter lives with her   Social Drivers of Health   Financial Resource Strain: Low Risk  (01/28/2024)   Overall Financial Resource Strain (CARDIA)    Difficulty of Paying Living Expenses: Not very hard  Food Insecurity: No Food Insecurity (01/28/2024)   Hunger Vital Sign    Worried About Running Out of Food in the Last Year: Never true    Ran Out of Food in the Last Year: Never true  Transportation Needs: No Transportation Needs (01/28/2024)   PRAPARE - Administrator, Civil Service (Medical): No    Lack of Transportation (Non-Medical): No  Physical Activity: Insufficiently Active (01/28/2024)   Exercise Vital Sign    Days of Exercise per Week: 3 days    Minutes of Exercise per Session: 10  min  Stress: No Stress Concern Present (01/28/2024)   [employer] of Occupational Health - Occupational Stress Questionnaire    Feeling of Stress: Not at all  Social Connections: Moderately Integrated (01/28/2024)   Social Connection and Isolation Panel    Frequency of Communication with Friends and Family: More than three times a week    Frequency of Social Gatherings with Friends and Family: More than three times a week    Attends Religious Services: More than 4 times per year    Active Member of [employer] or Organizations: Yes    Attends Banker Meetings: More than 4 times per year    Marital Status: Widowed    Tobacco Counseling Counseling given: Not Answered    Clinical Intake:  Pre-visit preparation completed: Yes  Pain : No/denies pain     BMI - recorded: 29.63 Nutritional Status: BMI 25 -29 Overweight Nutritional Risks: None Diabetes: No  Lab Results  Component Value Date   HGBA1C 7.4 (H) 06/23/2023   HGBA1C 6.7 (H) 12/10/2022   HGBA1C 7.1 (H) 05/06/2022     How often do you need to have someone help you when you read instructions, pamphlets, or other written materials from your doctor or pharmacy?: 1 - Never What is the last grade level you completed in school?: Master's degree  Interpreter Needed?: No  Information entered by :: Lolita Libra, CMA   Activities of Daily Living     01/28/2024    2:28 PM  In your present state of health, do you have any difficulty performing the following activities:  Hearing? 0  Vision? 0  Difficulty concentrating or making decisions? 0  Walking or climbing stairs? 0  Dressing or bathing? 0  Doing errands, shopping? 0  Preparing Food and eating ? N  Using the Toilet? N  In the past six months, have you accidently leaked urine? N  Do you have problems with loss of bowel control? N  Managing your Medications? N  Managing your Finances? N  Housekeeping or managing your Housekeeping? N    Patient  Care Team: Domenica Harlene LABOR, MD as PCP - General (Family Medicine) Latisha Arch, OD as Consulting Physician [facility], [facility]  I have updated your Care Teams any recent Medical Services you may have received from other providers in the past year.     Assessment:   This is a routine wellness examination for Gayl.  Hearing/Vision screen Hearing Screening - Comments:: Denies hearing difficulties.  Vision Screening - Comments:: Wears RX glasses -- up to date with routine eye exams.    Goals Addressed   None    Depression Screen     01/28/2024    2:33 PM 01/08/2023    9:40 AM 05/06/2022    9:39 AM 01/06/2022    3:25 PM 10/10/2021    9:32 AM 03/14/2021    2:08 PM 11/29/2020    3:18 PM  PHQ 2/9 Scores  PHQ - 2 Score 0 0 1 0 0 0 0  PHQ- 9 Score 1 0 1   4     Fall Risk     01/28/2024    2:26 PM 01/07/2023    5:59 PM 05/06/2022    9:38 AM 01/06/2022    3:24 PM 10/10/2021    9:32 AM  Fall Risk   Falls in the past year? 0 0 0 0 0  Number falls in past yr: 0 0 0 0 0  Injury with Fall? 0 0 0 0 0  Risk for fall due to : No Fall Risks No Fall Risks   No Fall Risks  Follow up Education provided Falls prevention discussed Falls evaluation completed  Falls prevention discussed  Falls evaluation completed      Data saved with a previous flowsheet row definition    MEDICARE RISK AT HOME:  Medicare Risk at Home Any stairs in or around the home?: Yes If so, are there any without handrails?: Yes Home free of loose throw rugs in walkways, pet beds, electrical cords, etc?: Yes Adequate lighting in your home to reduce risk of falls?: Yes Life alert?: No Use of a cane, walker or w/c?: No Grab bars in the bathroom?: No Shower chair or bench in shower?: Yes (built in seat) Elevated toilet seat or  a handicapped toilet?: No  TIMED UP AND GO:  Was the test performed?  No,audio   Cognitive Function: 6CIT completed    04/17/2016    3:02 PM  MMSE - Mini Mental State  Exam  Orientation to time 5   Orientation to Place 5   Registration 3   Attention/ Calculation 5   Recall 3   Language- name 2 objects 2   Language- repeat 1  Language- follow 3 step command 3   Language- read & follow direction 1   Write a sentence 1   Copy design 1   Total score 30      Data saved with a previous flowsheet row definition        01/28/2024    2:34 PM 01/08/2023    9:43 AM  6CIT Screen  What Year? 0 points 0 points  What month? 0 points 0 points  What time? 0 points 0 points  Count back from 20 0 points 0 points  Months in reverse 0 points 0 points  Repeat phrase 0 points 0 points  Total Score 0 points 0 points    Immunizations Immunization History  Administered Date(s) Administered   Fluad Quad(high Dose 65+) 03/14/2021, 05/06/2022   Hepatitis A, Adult 04/03/2009   Influenza, High Dose Seasonal PF 04/17/2016, 06/04/2017, 07/09/2018, 03/06/2020, 03/30/2023   Influenza,inj,Quad PF,6+ Mos 04/11/2014, 05/23/2015   PFIZER Comirnaty (Gray Top)Covid-19 Tri-Sucrose Vaccine 02/01/2021   PFIZER(Purple Top)SARS-COV-2 Vaccination 08/09/2019, 08/30/2019, 05/16/2020   Pfizer(Comirnaty )Fall Seasonal Vaccine 12 years and older 05/06/2022, 03/30/2023   Tdap 04/03/2009    Screening Tests Health Maintenance  Topic Date Due   Diabetic kidney evaluation - Urine ACR  Never done   DTaP/Tdap/Td (2 - Td or Tdap) 04/04/2019   MAMMOGRAM  06/03/2023   HEMOGLOBIN A1C  12/21/2023   INFLUENZA VACCINE  01/15/2024   COVID-19 Vaccine (7 - 2024-25 season) 12/13/2024 (Originally 09/28/2023)   Pneumococcal Vaccine: 50+ Years (1 of 2 - PCV) 01/27/2025 (Originally 09/05/1964)   Zoster Vaccines- Shingrix (1 of 2) 01/27/2025 (Originally 09/04/1997)   OPHTHALMOLOGY EXAM  04/22/2024   DEXA SCAN  06/02/2024   Diabetic kidney evaluation - eGFR measurement  06/22/2024   FOOT EXAM  06/22/2024   Medicare Annual Wellness (AWV)  01/27/2025   Hepatitis C Screening  Completed   HPV VACCINES   Aged Out   Meningococcal B Vaccine  Aged Out   Colonoscopy  Discontinued    Health Maintenance  Health Maintenance Due  Topic Date Due   Diabetic kidney evaluation - Urine ACR  Never done   DTaP/Tdap/Td (2 - Td or Tdap) 04/04/2019   MAMMOGRAM  06/03/2023   HEMOGLOBIN A1C  12/21/2023   INFLUENZA VACCINE  01/15/2024   Health Maintenance Items Addressed: Will consider tetanus vaccine at pharmacy. Declines pneumonia vaccine.  Additional Screening:  Vision Screening: Recommended annual ophthalmology exams for early detection of glaucoma and other disorders of the eye. Would you like a referral to an eye doctor? No    Dental Screening: Recommended annual dental exams for proper oral hygiene  Community Resource Referral / Chronic Care Management: CRR required this visit?  No   CCM required this visit?  No   Plan:    I have personally reviewed and noted the following in the patient's chart:   Medical and social history Use of alcohol, tobacco or illicit drugs  Current medications and supplements including opioid prescriptions. Patient is not currently taking opioid prescriptions. Functional ability and status Nutritional status Physical activity  Advanced directives List of other physicians Hospitalizations, surgeries, and ER visits in previous 12 months Vitals Screenings to include cognitive, depression, and falls Referrals and appointments  In addition, I have reviewed and discussed with patient certain preventive protocols, quality metrics, and best practice recommendations. A written personalized care plan for preventive services as well as general preventive health recommendations were provided to patient.   Lolita Libra, CMA   01/28/2024   After Visit Summary: (MyChart) Due to this being a telephonic visit, the after visit summary with patients personalized plan was offered to patient via MyChart   Notes: Nothing significant to report at this time.

## 2024-01-28 NOTE — Patient Instructions (Addendum)
 Ms. Morgan Medina , Thank you for taking time out of your busy schedule to complete your Annual Wellness Visit with me. I enjoyed our conversation and look forward to speaking with you again next year. I, as well as your care team,  appreciate your ongoing commitment to your health goals. Please review the following plan we discussed and let me know if I can assist you in the future. Your Game plan/ To Do List   Referrals: If you haven't heard from the office you've been referred to, please reach out to them at the phone provided.   Mammogram due after 06/25/23 [facility]):  [phone]  Bone Density due after 06/02/24 [facility]):  [phone]  Follow up Visits: Next Medicare AWV with our clinical staff:   02/01/25 2:20pm  Next Office Visit with your provider: Harlene Jolly, NP 02/03/24 2:40pm  Dr Antonio Meth:  06/23/24 11am  Clinician Recommendations:  Aim for 30 minutes of exercise or brisk walking, 6-8 glasses of water, and 5 servings of fruits and vegetables each day.   You will need to get the following vaccines at your local pharmacy:  tetanus, flu   This is a list of the screening recommended for you and due dates:  Health Maintenance  Topic Date Due   Yearly kidney health urinalysis for diabetes  Never done   Pneumococcal Vaccine for age over 27 (1 of 2 - PCV) Never done   Zoster (Shingles) Vaccine (1 of 2) Never done   DTaP/Tdap/Td vaccine (2 - Td or Tdap) 04/04/2019   Mammogram  06/03/2023   Hemoglobin A1C  12/21/2023   Medicare Annual Wellness Visit  01/08/2024   Flu Shot  01/15/2024   COVID-19 Vaccine (7 - 2024-25 season) 12/13/2024*   Eye exam for diabetics  04/22/2024   DEXA scan (bone density measurement)  06/02/2024   Yearly kidney function blood test for diabetes  06/22/2024   Complete foot exam   06/22/2024   Hepatitis C Screening  Completed   HPV Vaccine  Aged Out   Meningitis B Vaccine  Aged Out   Colon Cancer Screening  Discontinued  *Topic  was postponed. The date shown is not the original due date.    Advanced directives: (ACP Link)Information on Advanced Care Planning can be found at [place]  Secretary of [facility] Advance Health Care Directives Advance Health Care Directives. [email or web address]  Advance Care Planning is important because it:  [x]  Makes sure you receive the medical care that is consistent with your values, goals, and preferences  [x]  It provides guidance to your family and loved ones and reduces their decisional burden about whether or not they are making the right decisions based on your wishes.  Follow the link provided in your after visit summary or read over the paperwork we have mailed to you to help you started getting your Advance Directives in place. If you need assistance in completing these, please reach out to us  so that we can help you!  See attachments for Preventive Care and Fall Prevention Tips.

## 2024-01-29 ENCOUNTER — Other Ambulatory Visit: Payer: Self-pay | Admitting: Family Medicine

## 2024-01-30 ENCOUNTER — Other Ambulatory Visit: Payer: Self-pay | Admitting: Family Medicine

## 2024-01-30 DIAGNOSIS — E1100 Type 2 diabetes mellitus with hyperosmolarity without nonketotic hyperglycemic-hyperosmolar coma (NKHHC): Secondary | ICD-10-CM

## 2024-01-31 NOTE — Assessment & Plan Note (Addendum)
 Supplement and monitor

## 2024-01-31 NOTE — Progress Notes (Unsigned)
 Subjective:     Patient ID: Morgan Medina, female    DOB: 1945/11/25, 78 y.o.   MRN: [id number]  No chief complaint on file.   HPI  Morgan Medina is a 78 year old female presents for follow-up chronic conditions  Patient Care Team: Domenica Harlene LABOR, MD as PCP - General (Family Medicine) Latisha Arch, OD as Consulting Physician (Optometry) Center, [facility]   Follows with ophthalmology and Ortho  Aspirin 81 mg daily  HTN-  amlodipine  5 mg daily carvedilol  25 mg twice daily Losartan  100 mg at bedtime Furosemide  Lasix  40 mg twice daily, every other day; on odd day 40 mg once daily  HLD  The 10-year ASCVD risk score (Arnett DK, et al., 2019) is: 55.4%   Values used to calculate the score:     Age: 62 years     Clincally relevant sex: Female     Is Non-Hispanic African American: Yes     Diabetic: Yes     Tobacco smoker: No     Systolic Blood Pressure: 162 mmHg     Is BP treated: Yes     HDL Cholesterol: 50.3 mg/dL     Total Cholesterol: 293 mg/dL   GERD-famotidine  40 mg daily  Type II DM Semaglutide  (Rybelsus ) 3 mg daily  Vitamin D  deficiency Vitamin D  1000 units daily  Patient reports taking medications as prescribed Patient denies fever, chills, SOB, CP, palpitations, dyspnea, edema, HA, vision changes, N/V/D, abdominal pain, urinary symptoms, rash, weight changes, and recent illness or hospitalizations.    History of Present Illness              Health Maintenance Due  Topic Date Due   Diabetic kidney evaluation - Urine ACR  Never done   DTaP/Tdap/Td (2 - Td or Tdap) 04/04/2019   MAMMOGRAM  06/03/2023   HEMOGLOBIN A1C  12/21/2023   INFLUENZA VACCINE  01/15/2024    Past Medical History:  Diagnosis Date   Anemia    Cerebral aneurysm    h/o in 2006   Dacrocystitis 07/19/2015   right   Diabetes mellitus type 2 in obese 11/05/2014   GERD (gastroesophageal reflux disease) 04/17/2016   Hyperlipidemia    Hypertension    Osteoarthritis of left  hip 12/02/2015   Osteopenia 11/22/2015   Overweight     Past Surgical History:  Procedure Laterality Date   ABDOMINAL HYSTERECTOMY     CEREBRAL ANEURYSM REPAIR      Family History  Problem Relation Age of Onset   Alzheimer's disease Mother    Cerebral aneurysm Father    Lupus Daughter    Cancer Maternal Aunt        breast   Diabetes Maternal Grandmother    Stroke Maternal Grandfather    Alzheimer's disease Paternal Grandmother    Stroke Paternal Grandfather     Social History   Socioeconomic History   Marital status: Widowed    Spouse name: Not on file   Number of children: 1   Years of education: Not on file   Highest education level: Master's degree (e.g., MA, MS, MEng, MEd, MSW, MBA)  Occupational History   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  works as a Education officer, environmental, retired from [employer]  Other Topics Concern   Not on file  Social History Narrative   1 daughter, 3 grandchildren and 2 Art gallery manager. Daughter and 1 granddaughter lives with her   Social Drivers of Health   Financial Resource Strain: Low Risk  (01/28/2024)   Overall Financial Resource Strain (CARDIA)    Difficulty of Paying Living Expenses: Not very hard  Food Insecurity: No Food Insecurity (01/28/2024)   Hunger Vital Sign    Worried About Running Out of Food in the Last Year: Never true    Ran Out of Food in the Last Year: Never true  Transportation Needs: No Transportation Needs (01/28/2024)   PRAPARE - Administrator, Civil Service (Medical): No    Lack of Transportation (Non-Medical): No  Physical Activity: Insufficiently Active (01/28/2024)   Exercise Vital Sign    Days of Exercise per Week: 3  days    Minutes of Exercise per Session: 10 min  Stress: No Stress Concern Present (01/28/2024)   [employer] of Occupational Health - Occupational Stress Questionnaire    Feeling of Stress: Not at all  Social Connections: Moderately Integrated (01/28/2024)   Social Connection and Isolation Panel    Frequency of Communication with Friends and Family: More than three times a week    Frequency of Social Gatherings with Friends and Family: More than three times a week    Attends Religious Services: More than 4 times per year    Active Member of [employer] or Organizations: Yes    Attends Banker Meetings: More than 4 times per year    Marital Status: Widowed  Intimate Partner Violence: Not At Risk (01/28/2024)   Humiliation, Afraid, Rape, and Kick questionnaire    Fear of Current or Ex-Partner: No    Emotionally Abused: No    Physically Abused: No    Sexually Abused: No    Outpatient Medications Prior to Visit  Medication Sig Dispense Refill   amLODipine  (NORVASC ) 5 MG tablet Take 1 tablet (5 mg total) by mouth daily. 30 tablet 0   aspirin 81 MG tablet Takes every other day     Blood Glucose Monitoring Suppl (ONE TOUCH BASIC SYSTEM) W/DEVICE KIT Use to check blood sugar. DX. E11.9 1 each 0   carvedilol  (COREG ) 25 MG tablet Take 1 tablet (25 mg total) by mouth 2 (two) times daily with a meal. 180 tablet 0   famotidine  (PEPCID ) 40 MG tablet TAKE 1 TABLET BY MOUTH EVERY DAY 90 tablet 1   ferrous sulfate  325 (65 FE) MG tablet TAKE 1 TABLET BY MOUTH EVERY DAY WITH BREAKFAST 90 tablet 1   furosemide  (LASIX ) 40 MG tablet TAKE 1 TABLET (40 MG TOTAL) BY MOUTH 2 (TWO) TIMES DAILY. 40 MG TWICE DAILY EVERY OTHER DAY. ON THE ODD DAYS 40 MG ONCE DAILY. 180 tablet 0   losartan  (COZAAR ) 100 MG tablet TAKE 1 TABLET BY MOUTH EVERY EVENING. 90 tablet 1   Multiple Vitamin (MULTIVITAMIN) capsule Take 1 capsule by mouth daily.     ONE TOUCH LANCETS MISC Use as directed once daily to check blood  sugar. DX: E11.9 100 each 0   potassium chloride  SA (KLOR-CON  M20) 20 MEQ tablet Take 1 tablet (20 mEq total) by mouth every other day. 45 tablet 0   RESTASIS 0.05 % ophthalmic emulsion INSTILL 1 DROP INTO BOTH EYES TWICE A DAY  3   Semaglutide  (RYBELSUS ) 3 MG TABS Take 1 tablet (3 mg  total) by mouth daily. 30 tablet 2   Vitamin D , Cholecalciferol, 25 MCG (1000 UT) CAPS Take 1,000 Units by mouth daily.     No facility-administered medications prior to visit.    Allergies  Allergen Reactions   Metformin  And Related Diarrhea and Nausea Only    ROS See HPI    Objective:    Physical Exam  General: No acute distress. Awake and conversant. +obese  Eyes: Normal conjunctiva, anicteric. Round symmetric pupils.  ENT: Hearing grossly intact. No nasal discharge.  Neck: Neck is supple. No masses or thyromegaly.  Respiratory: CTAB. Respirations are non-labored. No wheezing.  Skin: Warm. No rashes or ulcers.  Psych: Alert and oriented. Cooperative, Appropriate mood and affect, Normal judgment.  CV: RRR. No murmur. No lower extremity edema.  MSK: Normal ambulation. No clubbing or cyanosis.  Neuro:  CN II-XII grossly normal.    There were no vitals taken for this visit. Wt Readings from Last 3 Encounters:  01/28/24 192 lb (87.1 kg)  06/23/23 192 lb (87.1 kg)  01/26/23 196 lb (88.9 kg)       Assessment & Plan:   Problem List Items Addressed This Visit     Anemia - Primary   Asymptomatic, update labs today.      HTN (hypertension)   Well controlled, no changes to meds. Encouraged heart healthy diet such as the DASH diet and exercise as tolerated.        Hyperlipidemia   Update lipid panel today.  Start on Crestor  20 mg daily. Medication and common side effects reviewed with the patient; patient voiced understanding and had no further questions at this time.       Type 2 diabetes mellitus with hyperosmolarity without coma, without long-term current use of insulin (HCC)   hgba1c  acceptable, minimize simple carbs. Increase exercise as tolerated. Continue current meds       Vitamin D  deficiency   Supplement and monitor.       I am having Morgan Medina maintain her multivitamin, aspirin, ONE TOUCH LANCETS, ONE TOUCH BASIC SYSTEM, ferrous sulfate , Restasis, Vitamin D  (Cholecalciferol), Rybelsus , famotidine , losartan , carvedilol , amLODipine , furosemide , and potassium chloride  SA.  No orders of the defined types were placed in this encounter.

## 2024-01-31 NOTE — Assessment & Plan Note (Signed)
 Well controlled, no changes to meds. Encouraged heart healthy diet such as the DASH diet and exercise as tolerated.

## 2024-01-31 NOTE — Assessment & Plan Note (Signed)
 Asymptomatic, update labs today.

## 2024-01-31 NOTE — Assessment & Plan Note (Addendum)
 Update lipid panel today.  Start on Crestor  20 mg daily. Medication and common side effects reviewed with the patient; patient voiced understanding and had no further questions at this time.  FU 3 months recheck lipid panel

## 2024-01-31 NOTE — Assessment & Plan Note (Signed)
 hgba1c acceptable, minimize simple carbs. Increase exercise as tolerated. Continue current meds

## 2024-02-03 ENCOUNTER — Encounter: Payer: Self-pay | Admitting: Student

## 2024-02-03 ENCOUNTER — Ambulatory Visit: Admitting: Student

## 2024-02-03 VITALS — BP 126/80 | HR 63 | Temp 98.1°F | Resp 12 | Ht 67.5 in | Wt 192.8 lb

## 2024-02-03 DIAGNOSIS — E782 Mixed hyperlipidemia: Secondary | ICD-10-CM

## 2024-02-03 DIAGNOSIS — D508 Other iron deficiency anemias: Secondary | ICD-10-CM

## 2024-02-03 DIAGNOSIS — E559 Vitamin D deficiency, unspecified: Secondary | ICD-10-CM

## 2024-02-03 DIAGNOSIS — Z1231 Encounter for screening mammogram for malignant neoplasm of breast: Secondary | ICD-10-CM

## 2024-02-03 DIAGNOSIS — I10 Essential (primary) hypertension: Secondary | ICD-10-CM

## 2024-02-03 DIAGNOSIS — Z780 Asymptomatic menopausal state: Secondary | ICD-10-CM | POA: Diagnosis not present

## 2024-02-03 DIAGNOSIS — E1100 Type 2 diabetes mellitus with hyperosmolarity without nonketotic hyperglycemic-hyperosmolar coma (NKHHC): Secondary | ICD-10-CM

## 2024-02-03 LAB — IRON,TIBC AND FERRITIN PANEL
%SAT: 27 % (ref 16–45)
Ferritin: 592 ng/mL — ABNORMAL HIGH (ref 16–288)
Iron: 88 ug/dL (ref 45–160)
TIBC: 329 ug/dL (ref 250–450)

## 2024-02-03 MED ORDER — ROSUVASTATIN CALCIUM 20 MG PO TABS: 20.0000 mg | ORAL_TABLET | Freq: Every day | ORAL | 3 refills | Status: DC

## 2024-02-04 ENCOUNTER — Ambulatory Visit: Payer: Self-pay | Admitting: Student

## 2024-02-04 DIAGNOSIS — E559 Vitamin D deficiency, unspecified: Secondary | ICD-10-CM

## 2024-02-04 DIAGNOSIS — E782 Mixed hyperlipidemia: Secondary | ICD-10-CM

## 2024-02-04 LAB — COMPREHENSIVE METABOLIC PANEL WITH GFR
ALT: 13 U/L (ref 0–35)
AST: 14 U/L (ref 0–37)
Albumin: 4.3 g/dL (ref 3.5–5.2)
Alkaline Phosphatase: 66 U/L (ref 39–117)
BUN: 15 mg/dL (ref 6–23)
CO2: 31 meq/L (ref 19–32)
Calcium: 9.2 mg/dL (ref 8.4–10.5)
Chloride: 101 meq/L (ref 96–112)
Creatinine, Ser: 0.88 mg/dL (ref 0.40–1.20)
GFR: 63.84 mL/min (ref >60.00)
Glucose, Bld: 107 mg/dL — ABNORMAL HIGH (ref 70–99)
Potassium: 4.1 meq/L (ref 3.5–5.1)
Sodium: 142 meq/L (ref 135–145)
Total Bilirubin: 0.3 mg/dL (ref 0.2–1.2)
Total Protein: 7.5 g/dL (ref 6.0–8.3)

## 2024-02-04 LAB — VITAMIN D 25 HYDROXY (VIT D DEFICIENCY, FRACTURES): VITD: 26.53 ng/mL — ABNORMAL LOW (ref 30.00–100.00)

## 2024-02-04 LAB — CBC WITH DIFFERENTIAL/PLATELET
Basophils Absolute: 0.1 K/uL (ref 0.0–0.1)
Basophils Relative: 1.2 % (ref 0.0–3.0)
Eosinophils Absolute: 0.1 K/uL (ref 0.0–0.7)
Eosinophils Relative: 1.1 % (ref 0.0–5.0)
HCT: 37.8 % (ref 36.0–46.0)
Hemoglobin: 11.9 g/dL — ABNORMAL LOW (ref 12.0–15.0)
Lymphocytes Relative: 30.2 % (ref 12.0–46.0)
Lymphs Abs: 2.8 K/uL (ref 0.7–4.0)
MCHC: 31.4 g/dL (ref 30.0–36.0)
MCV: 74.1 fl — ABNORMAL LOW (ref 78.0–100.0)
Monocytes Absolute: 0.6 K/uL (ref 0.1–1.0)
Monocytes Relative: 6.4 % (ref 3.0–12.0)
Neutro Abs: 5.7 K/uL (ref 1.4–7.7)
Neutrophils Relative %: 61.1 % (ref 43.0–77.0)
Platelets: 285 K/uL (ref 150.0–400.0)
RBC: 5.1 Mil/uL (ref 3.87–5.11)
RDW: 13.6 % (ref 11.5–15.5)
WBC: 9.4 K/uL (ref 4.0–10.5)

## 2024-02-04 LAB — MICROALBUMIN / CREATININE URINE RATIO
Creatinine,U: 83.3 mg/dL
Microalb Creat Ratio: 21.5 mg/g (ref 0.0–30.0)
Microalb, Ur: 1.8 mg/dL (ref 0.0–1.9)

## 2024-02-04 LAB — LIPID PANEL
Cholesterol: 271 mg/dL — ABNORMAL HIGH (ref 0–200)
HDL: 49.5 mg/dL (ref >39.00)
LDL Cholesterol: 166 mg/dL — ABNORMAL HIGH (ref 0–99)
NonHDL: 221
Total CHOL/HDL Ratio: 5
Triglycerides: 274 mg/dL — ABNORMAL HIGH (ref 0.0–149.0)
VLDL: 54.8 mg/dL — ABNORMAL HIGH (ref 0.0–40.0)

## 2024-02-04 LAB — HEMOGLOBIN A1C: Hgb A1c MFr Bld: 7.1 % — ABNORMAL HIGH (ref 4.6–6.5)

## 2024-02-04 MED ORDER — VITAMIN D3 50 MCG (2000 UT) PO CAPS
2000.0000 [IU] | ORAL_CAPSULE | Freq: Every day | ORAL | 12 refills | Status: AC
Start: 2024-02-04 — End: ?

## 2024-03-16 ENCOUNTER — Other Ambulatory Visit: Payer: Self-pay | Admitting: Family Medicine

## 2024-03-25 ENCOUNTER — Telehealth: Payer: Self-pay | Admitting: Family Medicine

## 2024-03-25 NOTE — Telephone Encounter (Signed)
 Pt has a TOC scheduled with Dr.Lowne. is this transfer approved? It ok to keep this appt?

## 2024-04-09 ENCOUNTER — Other Ambulatory Visit: Payer: Self-pay | Admitting: Family Medicine

## 2024-04-13 ENCOUNTER — Other Ambulatory Visit: Payer: Self-pay | Admitting: Family Medicine

## 2024-04-13 DIAGNOSIS — E1100 Type 2 diabetes mellitus with hyperosmolarity without nonketotic hyperglycemic-hyperosmolar coma (NKHHC): Secondary | ICD-10-CM

## 2024-04-13 MED ORDER — RYBELSUS 3 MG PO TABS: 1.0000 | ORAL_TABLET | Freq: Every day | ORAL | 1 refills | Status: AC

## 2024-04-13 NOTE — Telephone Encounter (Signed)
 Copied from CRM [id number]. Topic: Clinical - Medication Refill >> Apr 13, 2024  1:32 PM Morgan Medina wrote: Medication: RYBELSUS  3 MG TABS [[id number]]  Has the patient contacted their pharmacy? Yes (Agent: If no, request that the patient contact the pharmacy for the refill. If patient does not wish to contact the pharmacy document the reason why and proceed with request.) (Agent: If yes, when and what did the pharmacy advise?)  This is the patient's preferred pharmacy:  [id number] - [place], [place] - [address]OSS [address] [place] [address] Phone: [phone] Fax: [phone]  Is this the correct pharmacy for this prescription? Yes If no, delete pharmacy and type the correct one.   Has the prescription been filled recently? No  Is the patient out of the medication? Yes  Has the patient been seen for an appointment in the last year OR does the patient have an upcoming appointment? Yes  Can we respond through MyChart? Yes  Agent: Please be advised that Rx refills may take up to 3 business days. We ask that you follow-up with your pharmacy.

## 2024-04-15 DIAGNOSIS — E119 Type 2 diabetes mellitus without complications: Secondary | ICD-10-CM | POA: Diagnosis not present

## 2024-04-15 DIAGNOSIS — Z961 Presence of intraocular lens: Secondary | ICD-10-CM | POA: Diagnosis not present

## 2024-04-16 ENCOUNTER — Other Ambulatory Visit: Payer: Self-pay | Admitting: Family Medicine

## 2024-05-01 NOTE — Progress Notes (Signed)
 Pharmacy Quality Measure Review  This patient is appearing on a report for being at risk of failing the adherence measure for diabetes medications this calendar year.   Medication: Rybelsus  3 mg Last fill date: 04/14/24 for 90 day supply  Insurance report was not up to date. No action needed at this time.   Jenkins Graces, PharmD PGY1 Pharmacy Resident [phone]

## 2024-05-06 ENCOUNTER — Ambulatory Visit: Admitting: Student

## 2024-05-06 ENCOUNTER — Encounter: Payer: Self-pay | Admitting: Student

## 2024-05-06 VITALS — BP 194/72 | HR 62 | Temp 97.5°F | Ht 67.5 in | Wt 197.0 lb

## 2024-05-06 DIAGNOSIS — I10 Essential (primary) hypertension: Secondary | ICD-10-CM | POA: Diagnosis not present

## 2024-05-06 DIAGNOSIS — E559 Vitamin D deficiency, unspecified: Secondary | ICD-10-CM | POA: Diagnosis not present

## 2024-05-06 DIAGNOSIS — Z23 Encounter for immunization: Secondary | ICD-10-CM

## 2024-05-06 DIAGNOSIS — E1100 Type 2 diabetes mellitus with hyperosmolarity without nonketotic hyperglycemic-hyperosmolar coma (NKHHC): Secondary | ICD-10-CM | POA: Diagnosis not present

## 2024-05-06 DIAGNOSIS — D508 Other iron deficiency anemias: Secondary | ICD-10-CM

## 2024-05-06 DIAGNOSIS — E782 Mixed hyperlipidemia: Secondary | ICD-10-CM | POA: Diagnosis not present

## 2024-05-06 DIAGNOSIS — K219 Gastro-esophageal reflux disease without esophagitis: Secondary | ICD-10-CM | POA: Diagnosis not present

## 2024-05-06 LAB — CBC WITH DIFFERENTIAL/PLATELET
Basophils Absolute: 0 K/uL (ref 0.0–0.1)
Basophils Relative: 0.6 % (ref 0.0–3.0)
Eosinophils Absolute: 0.1 K/uL (ref 0.0–0.7)
Eosinophils Relative: 1.7 % (ref 0.0–5.0)
HCT: 36.5 % (ref 36.0–46.0)
Hemoglobin: 11.6 g/dL — ABNORMAL LOW (ref 12.0–15.0)
Lymphocytes Relative: 37.9 % (ref 12.0–46.0)
Lymphs Abs: 2.3 K/uL (ref 0.7–4.0)
MCHC: 31.8 g/dL (ref 30.0–36.0)
MCV: 74.8 fl — ABNORMAL LOW (ref 78.0–100.0)
Monocytes Absolute: 0.4 K/uL (ref 0.1–1.0)
Monocytes Relative: 6.1 % (ref 3.0–12.0)
Neutro Abs: 3.2 K/uL (ref 1.4–7.7)
Neutrophils Relative %: 53.7 % (ref 43.0–77.0)
Platelets: 256 K/uL (ref 150.0–400.0)
RBC: 4.88 Mil/uL (ref 3.87–5.11)
RDW: 13.3 % (ref 11.5–15.5)
WBC: 6 K/uL (ref 4.0–10.5)

## 2024-05-06 LAB — COMPREHENSIVE METABOLIC PANEL WITH GFR
ALT: 9 U/L (ref 0–35)
AST: 12 U/L (ref 0–37)
Albumin: 4.4 g/dL (ref 3.5–5.2)
Alkaline Phosphatase: 58 U/L (ref 39–117)
BUN: 16 mg/dL (ref 6–23)
CO2: 32 meq/L (ref 19–32)
Calcium: 9.4 mg/dL (ref 8.4–10.5)
Chloride: 102 meq/L (ref 96–112)
Creatinine, Ser: 0.82 mg/dL (ref 0.40–1.20)
GFR: 69.36 mL/min (ref >60.00)
Glucose, Bld: 117 mg/dL — ABNORMAL HIGH (ref 70–99)
Potassium: 3.9 meq/L (ref 3.5–5.1)
Sodium: 142 meq/L (ref 135–145)
Total Bilirubin: 0.4 mg/dL (ref 0.2–1.2)
Total Protein: 7.5 g/dL (ref 6.0–8.3)

## 2024-05-06 LAB — LIPID PANEL
Cholesterol: 256 mg/dL — ABNORMAL HIGH (ref 0–200)
HDL: 52.1 mg/dL (ref >39.00)
LDL Cholesterol: 184 mg/dL — ABNORMAL HIGH (ref 0–99)
NonHDL: 203.43
Total CHOL/HDL Ratio: 5
Triglycerides: 95 mg/dL (ref 0.0–149.0)
VLDL: 19 mg/dL (ref 0.0–40.0)

## 2024-05-06 LAB — IRON,TIBC AND FERRITIN PANEL
%SAT: 34 % (ref 16–45)
Ferritin: 530 ng/mL — ABNORMAL HIGH (ref 16–288)
Iron: 111 ug/dL (ref 45–160)
TIBC: 329 ug/dL (ref 250–450)

## 2024-05-06 LAB — HEMOGLOBIN A1C: Hgb A1c MFr Bld: 6.8 % — ABNORMAL HIGH (ref 4.6–6.5)

## 2024-05-06 LAB — VITAMIN D 25 HYDROXY (VIT D DEFICIENCY, FRACTURES): VITD: 33.19 ng/mL (ref 30.00–100.00)

## 2024-05-06 NOTE — Assessment & Plan Note (Signed)
 Stable on current medications

## 2024-05-06 NOTE — Assessment & Plan Note (Signed)
 Supplement monitor.  The patient has been taking 1000 units daily since his last level, encourage patient to restart taking 2000 units vitamin D  daily.  Recheck levels today.

## 2024-05-06 NOTE — Assessment & Plan Note (Addendum)
 Missed her antihypertensive dose today. No evidence of end-organ damage upon assessment. Advised patient not to hold blood pressure medications for routine lab work and to take them consistently to avoid rebound hypertension. - Take antihypertensive medication immediately. - Go to emergency room if worsening headache, vision issues, fainting, or dizziness occur. - Continue current medications.

## 2024-05-06 NOTE — Assessment & Plan Note (Addendum)
 Lipid levels high at 166 mg/dL off medication. ASCVD risk score 62%.  Spoke at length with patient about ASCVD risk and LDL goal >70.  Patient voices understanding. Repeat lipid labs today encourage patient to restart rosuvastatin  20 mg daily, she has not been taking this medication. - Follow up in 3 months to assess lipid levels.

## 2024-05-06 NOTE — Progress Notes (Signed)
 Subjective:     Patient ID: Morgan Medina, female    DOB: March 22, 1946, 78 y.o.   MRN: [id number]  No chief complaint on file.   HPI  Morgan Medina is a pleasant 78 year old female presents for follow-up of chronic conditions.   Her blood pressure is elevated at OV today. She has not taken her blood pressure medication today due to not eating. She experiences a mild headache but denies vision issues. Denies confusion, neurological symptoms, seizures, chest pain, SOB. Her current medications include amlodipine , carvedilol , losartan , and Lasix , taken once daily, and potassium every other day.  She has a history of high cholesterol and previously took Crestor . Her last lipid level was 166 mg/dL. She has a history of anemia and was taking ferrous sulfate .  She is on Rybelsus  for diabetes management, with her last A1c at 7.1%.   Patient Care Team: Morgan Harlene LABOR, MD as PCP - General (Family Medicine) Morgan Medina, OD as Consulting Physician (Optometry) Center, [facility]   Follows with ophthalmology and Ortho  Aspirin 81 mg daily  HTN-  amlodipine  5 mg daily, carvedilol  25 mg twice daily, Losartan  100 mg at bedtime Furosemide  Lasix  40 mg once daily, Potassium 20 meq every other day  HLD-on rosuvastatin  20 mg daily Pt reports she has not been taking this  GERD-famotidine  40 mg daily  Type II DM Semaglutide  (Rybelsus ) 3 mg daily  Anemia Ferrous sulfate  325 daily  Vitamin D  deficiency Vitamin D  1000 units daily  Patient denies fever, chills, SOB, CP, palpitations, dyspnea,  HA, vision changes, N/V/D, abdominal pain, urinary symptoms, rash, weight changes, and recent illness or hospitalizations.    History of Present Illness              Health Maintenance Due  Topic Date Due   DTaP/Tdap/Td (2 - Td or Tdap) 04/04/2019   Mammogram  06/03/2023   COVID-19 Vaccine (7 - 2025-26 season) 02/15/2024   Bone Density Scan  06/02/2024    Past Medical History:   Diagnosis Date   Anemia    Cerebral aneurysm    h/o in 2006   Dacrocystitis 07/19/2015   right   Diabetes mellitus type 2 in obese 11/05/2014   GERD (gastroesophageal reflux disease) 04/17/2016   Hyperlipidemia    Hypertension    Osteoarthritis of left hip 12/02/2015   Osteopenia 11/22/2015   Overweight     Past Surgical History:  Procedure Laterality Date   ABDOMINAL HYSTERECTOMY     CEREBRAL ANEURYSM REPAIR      Family History  Problem Relation Age of Onset   Alzheimer's disease Mother    Cerebral aneurysm Father    Lupus Daughter    Cancer Maternal Aunt        breast   Diabetes Maternal Grandmother    Stroke Maternal Grandfather    Alzheimer's disease Paternal Grandmother    Stroke Paternal Grandfather     Social History   Socioeconomic History   Marital status: Widowed    Spouse name: Not on file   Number of children: 1   Years of education: Not on file   Highest education level: Master's degree (e.g., MA, MS, MEng, MEd, MSW, MBA)  Occupational History   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 works as a education officer, environmental, retired from management consultant  Other Topics Concern   Not on file  Social History Narrative   1 daughter, 3 grandchildren and 2 art gallery manager. Daughter and 1 granddaughter lives with her   Social Drivers of Health   Financial Resource Strain: Low Risk  (02/02/2024)   Overall Financial Resource Strain (CARDIA)    Difficulty of Paying Living Expenses: Not hard at all  Food Insecurity: No Food Insecurity (02/02/2024)   Hunger Vital Sign    Worried About Running Out of Food in the Last Year: Never true    Ran Out of Food in the Last Year: Never true  Transportation  Needs: No Transportation Needs (02/02/2024)   PRAPARE - Administrator, Civil Service (Medical): No    Lack of Transportation (Non-Medical): No  Physical Activity: Insufficiently Active (02/02/2024)   Exercise Vital Sign    Days of Exercise per Week: 3 days    Minutes of Exercise per Session: 10 min  Stress: No Stress Concern Present (02/02/2024)   [employer] of Occupational Health - Occupational Stress Questionnaire    Feeling of Stress: Not at all  Social Connections: Moderately Integrated (02/02/2024)   Social Connection and Isolation Panel    Frequency of Communication with Friends and Family: More than three times a week    Frequency of Social Gatherings with Friends and Family: More than three times a week    Attends Religious Services: More than 4 times per year    Active Member of [employer] or Organizations: Yes    Attends Banker Meetings: More than 4 times per year    Marital Status: Widowed  Intimate Partner Violence: Not At Risk (01/28/2024)   Humiliation, Afraid, Rape, and Kick questionnaire    Fear of Current or Ex-Partner: No    Emotionally Abused: No    Physically Abused: No    Sexually Abused: No    Outpatient Medications Prior to Visit  Medication Sig Dispense Refill   amLODipine  (NORVASC ) 5 MG tablet Take 1 tablet (5 mg total) by mouth daily. 30 tablet 0   aspirin 81 MG tablet Takes every other day     Blood Glucose Monitoring Suppl (ONE TOUCH BASIC SYSTEM) W/DEVICE KIT Use to check blood sugar. DX. E11.9 1 each 0   carvedilol  (COREG ) 25 MG tablet Take 1 tablet (25 mg total) by mouth 2 (two) times daily with a meal. 180 tablet 1   Cholecalciferol (VITAMIN D3) 50 MCG (2000 UT) capsule Take 1 capsule (2,000 Units total) by mouth daily. 30 capsule 12   famotidine  (PEPCID ) 40 MG tablet Take 1 tablet (40 mg total) by mouth daily. 90 tablet 1   furosemide  (LASIX ) 40 MG tablet TAKE 1 TABLET (40 MG TOTAL) BY MOUTH 2 (TWO) TIMES DAILY. 40 MG TWICE  DAILY EVERY OTHER DAY. ON THE ODD DAYS 40 MG ONCE DAILY. 180 tablet 1   losartan  (COZAAR ) 100 MG tablet TAKE 1 TABLET BY MOUTH EVERY EVENING. 90 tablet 1   Multiple Vitamin (MULTIVITAMIN) capsule Take 1 capsule by mouth daily.     ONE TOUCH LANCETS MISC Use as directed once daily to check blood sugar. DX: E11.9 100 each 0   potassium chloride  SA (KLOR-CON  M20) 20 MEQ tablet Take 1 tablet (20 mEq total) by mouth every other day. 45 tablet 0   RESTASIS 0.05 % ophthalmic emulsion INSTILL 1 DROP INTO BOTH EYES  TWICE A DAY  3   rosuvastatin  (CRESTOR ) 20 MG tablet Take 1 tablet (20 mg total) by mouth daily. 90 tablet 3   Semaglutide  (RYBELSUS ) 3 MG TABS Take 1 tablet (3 mg total) by mouth daily. 90 tablet 1   ferrous sulfate  325 (65 FE) MG tablet TAKE 1 TABLET BY MOUTH EVERY DAY WITH BREAKFAST 90 tablet 1   No facility-administered medications prior to visit.    Allergies  Allergen Reactions   Metformin  And Related Diarrhea and Nausea Only    ROS See HPI    Objective:    Physical Exam  General: No acute distress. Awake and conversant.  Eyes: Normal conjunctiva, anicteric. Round symmetric pupils.  ENT: Hearing grossly intact. No nasal discharge.  Neck: Neck is supple. No masses or thyromegaly.  Respiratory: CTAB. Respirations are non-labored. No wheezing.  Skin: Warm. No rashes or ulcers.  Psych: Alert and oriented. Cooperative, Appropriate mood and affect, Normal judgment.  CV: RRR. NO LE edema MSK: Normal ambulation. No clubbing or cyanosis.  Neuro:  CN II-XII grossly normal.    BP (!) 194/72   Pulse 62   Temp (!) 97.5 F (36.4 C) (Oral)   Ht 5' 7.5 (1.715 m)   Wt 197 lb (89.4 kg)   SpO2 98%   BMI 30.40 kg/m  Wt Readings from Last 3 Encounters:  05/06/24 197 lb (89.4 kg)  02/03/24 192 lb 12.8 oz (87.5 kg)  01/28/24 192 lb (87.1 kg)       Assessment & Plan:   Problem List Items Addressed This Visit     Anemia   Asymptomatic. Last iron panel ferretin level elevated.  Repeat iron panel today. May discontinue daily ferrous sulfate . Advise daily multivitamin with minerals.      Relevant Orders   Iron, TIBC and Ferritin Panel   GERD (gastroesophageal reflux disease)   Stable on current medications.      HTN (hypertension) - Primary   Missed her antihypertensive dose today. No evidence of end-organ damage upon assessment. Advised patient not to hold blood pressure medications for routine lab work and to take them consistently to avoid rebound hypertension. - Take antihypertensive medication immediately. - Go to emergency room if worsening headache, vision issues, fainting, or dizziness occur. - Continue current medications.      Relevant Orders   CBC with Differential/Platelet   Hyperlipidemia   Lipid levels high at 166 mg/dL off medication. ASCVD risk score 62%.  Spoke at length with patient about ASCVD risk and LDL goal >70.  Patient voices understanding. Repeat lipid labs today encourage patient to restart rosuvastatin  20 mg daily, she has not been taking this medication. - Follow up in 3 months to assess lipid levels.         Relevant Orders   Lipid panel   Type 2 diabetes mellitus with hyperosmolarity without coma, without long-term current use of insulin (HCC)   hgba1c acceptable, minimize simple carbs. Increase exercise as tolerated. Continue current meds         Relevant Orders   Comp Met (CMET)   HgB A1c   Vitamin D  deficiency   Supplement monitor.  The patient has been taking 1000 units daily since his last level, encourage patient to restart taking 2000 units vitamin D  daily.  Recheck levels today.       Relevant Orders   Vitamin D  (25 hydroxy)   Other Visit Diagnoses       Need for influenza vaccination  Relevant Orders   Flu vaccine HIGH DOSE PF(Fluzone Trivalent) (Completed)      General Health Maintenance Completed eye exam, scheduled for bone density scan and mammogram in December, received flu shot today. -  Continue with scheduled bone density scan and mammogram in December. - Ensure regular eye exams are maintained.  I have discontinued Yaniris Molzahn's ferrous sulfate . I am also having her maintain her multivitamin, aspirin, ONE TOUCH LANCETS, ONE TOUCH BASIC SYSTEM, Restasis, amLODipine , potassium chloride  SA, rosuvastatin , Vitamin D3, famotidine , furosemide , losartan , Rybelsus , and carvedilol .  No orders of the defined types were placed in this encounter.

## 2024-05-06 NOTE — Assessment & Plan Note (Signed)
 Asymptomatic. Last iron panel ferretin level elevated. Repeat iron panel today. May discontinue daily ferrous sulfate . Advise daily multivitamin with minerals.

## 2024-05-06 NOTE — Assessment & Plan Note (Signed)
 hgba1c acceptable, minimize simple carbs. Increase exercise as tolerated. Continue current meds

## 2024-05-09 ENCOUNTER — Ambulatory Visit: Payer: Self-pay | Admitting: Student

## 2024-05-09 DIAGNOSIS — E7800 Pure hypercholesterolemia, unspecified: Secondary | ICD-10-CM

## 2024-05-09 MED ORDER — ROSUVASTATIN CALCIUM 40 MG PO TABS: 40.0000 mg | ORAL_TABLET | Freq: Every day | ORAL | 1 refills | Status: DC

## 2024-05-27 ENCOUNTER — Encounter: Payer: Self-pay | Admitting: Pharmacist

## 2024-05-27 NOTE — Progress Notes (Signed)
 Pharmacy Quality Measure Review  This patient is appearing on a report for being at risk of failing the adherence measure for cholesterol (statin) medications this calendar year.   Medication: rosuvastatin   Last fill date: 02/03/2024 for 90 day supply per adherence report  Reviewed recent refill history in Dr Annemarie database. Actual last refill date was 05/09/2024 for 90 day supply. Patient has 1 refills remaining. Next appointment with PCP is 06/23/2024 (TOC to Dr Cruz) .    Insurance report was not up to date. No action needed at this time.   Madelin Ray, PharmD Clinical Pharmacist [facility]  Population Health [phone]

## 2024-06-06 ENCOUNTER — Encounter: Payer: Self-pay | Admitting: Student

## 2024-06-06 ENCOUNTER — Ambulatory Visit (HOSPITAL_BASED_OUTPATIENT_CLINIC_OR_DEPARTMENT_OTHER)
Admission: RE | Admit: 2024-06-06 | Discharge: 2024-06-06 | Disposition: A | Discharge status: Home / Self Care | Source: Ambulatory Visit | Attending: Student | Admitting: Student

## 2024-06-06 ENCOUNTER — Encounter (HOSPITAL_BASED_OUTPATIENT_CLINIC_OR_DEPARTMENT_OTHER): Payer: Self-pay

## 2024-06-06 DIAGNOSIS — Z1231 Encounter for screening mammogram for malignant neoplasm of breast: Secondary | ICD-10-CM | POA: Insufficient documentation

## 2024-06-06 DIAGNOSIS — Z780 Asymptomatic menopausal state: Secondary | ICD-10-CM | POA: Insufficient documentation

## 2024-06-23 ENCOUNTER — Encounter: Payer: Self-pay | Admitting: Family Medicine

## 2024-06-23 ENCOUNTER — Ambulatory Visit: Admitting: Family Medicine

## 2024-06-23 VITALS — BP 140/70 | HR 72 | Temp 97.6°F | Resp 18 | Ht 67.5 in | Wt 193.2 lb

## 2024-06-23 DIAGNOSIS — Z794 Long term (current) use of insulin: Secondary | ICD-10-CM | POA: Diagnosis not present

## 2024-06-23 DIAGNOSIS — E1100 Type 2 diabetes mellitus with hyperosmolarity without nonketotic hyperglycemic-hyperosmolar coma (NKHHC): Secondary | ICD-10-CM | POA: Diagnosis not present

## 2024-06-23 DIAGNOSIS — D508 Other iron deficiency anemias: Secondary | ICD-10-CM

## 2024-06-23 DIAGNOSIS — E782 Mixed hyperlipidemia: Secondary | ICD-10-CM | POA: Diagnosis not present

## 2024-06-23 MED ORDER — IRON (FERROUS SULFATE) 325 (65 FE) MG PO TABS: ORAL_TABLET | ORAL | 3 refills | Status: AC

## 2024-06-23 MED ORDER — ROSUVASTATIN CALCIUM 20 MG PO TABS: 20.0000 mg | ORAL_TABLET | Freq: Every day | ORAL | 3 refills | Status: AC

## 2024-06-23 MED ORDER — ONETOUCH ULTRASOFT LANCETS MISC: 12 refills | Status: AC

## 2024-06-23 NOTE — Progress Notes (Signed)
 "   Subjective:    Patient ID: Morgan Medina, female    DOB: 25-Jan-1946, 79 y.o.   MRN: [id number]  Chief Complaint  Patient presents with   Transfer of Care    HPI Patient is in today to discuss cholesterol medication and iron .   Discussed the use of AI scribe software for clinical note transcription with the patient, who gave verbal consent to proceed.  History of Present Illness Morgan Medina is a 79 year old female who presents with intolerance to increased ribostatin dosage.  She has difficulty tolerating an increased dosage of ribostatin, which was raised from 20 mg to 40 mg due to elevated cholesterol levels. She states that the 40 mg dosage 'does not agree with me' and prefers to return to the 20 mg dosage, which she tolerated well previously. She had resumed the 20 mg dosage after a period of discontinuation but had not been on it long enough to assess its full benefit before her cholesterol levels were re-evaluated. She is currently taking the 40 mg dosage but has some 20 mg tablets remaining and requests a prescription for the 20 mg dosage to be sent to [facility], as she prefers this dosage and has experienced no issues with it in the past.  She has a family history of heart disease, specifically in her mother's sisters. She has not been able to check her blood sugars recently due to running out of OneTouch Ultra Soft lancets, although her A1c was noted to be good a month ago.  She reports feeling tired and falling asleep easily without iron  supplementation. She takes one iron  tablet daily and also takes B vitamins. Her hemoglobin was 11.6 two months ago.  No knowledge of snoring, but her daughter notes she snores when tired.    Past Medical History:  Diagnosis Date   Anemia    Cerebral aneurysm    h/o in 2006   Dacrocystitis 07/19/2015   right   Diabetes mellitus type 2 in obese 11/05/2014   GERD (gastroesophageal reflux disease) 04/17/2016   Hyperlipidemia    Hypertension     Osteoarthritis of left hip 12/02/2015   Osteopenia 11/22/2015   Overweight     Past Surgical History:  Procedure Laterality Date   ABDOMINAL HYSTERECTOMY     CEREBRAL ANEURYSM REPAIR      Family History  Problem Relation Age of Onset   Alzheimer's disease Mother    Cerebral aneurysm Father    Diabetes Maternal Grandmother    Stroke Maternal Grandfather    Alzheimer's disease Paternal Grandmother    Stroke Paternal Grandfather    Lupus Daughter    Cancer Maternal Aunt        breast   Heart disease Maternal Aunt     Social History   Socioeconomic History   Marital status: Widowed    Spouse name: Not on file   Number of children: 1   Years of education: Not on file   Highest education level: Master's degree (e.g., MA, MS, MEng, MEd, MSW, MBA)  Occupational History   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 works as a education officer, environmental, retired from management [employer]  Other Topics Concern  Not on file  Social History Narrative   1 daughter, 3 grandchildren and 2 great-grandchildren. Daughter and 1 granddaughter lives with her   Social Drivers of Health   Tobacco Use: Low Risk (06/23/2024)   Patient History    Smoking Tobacco Use: Never    Smokeless Tobacco Use: Never    Passive Exposure: Not on file  Financial Resource Strain: Low Risk (02/02/2024)   Overall Financial Resource Strain (CARDIA)    Difficulty of Paying Living Expenses: Not hard at all  Food Insecurity: No Food Insecurity (02/02/2024)   Epic    Worried About Programme Researcher, Broadcasting/film/video in the Last Year: Never true    Ran Out of Food in the Last Year: Never true  Transportation Needs: No Transportation Needs (02/02/2024)   Epic    Lack of  Transportation (Medical): No    Lack of Transportation (Non-Medical): No  Physical Activity: Insufficiently Active (02/02/2024)   Exercise Vital Sign    Days of Exercise per Week: 3 days    Minutes of Exercise per Session: 10 min  Stress: No Stress Concern Present (02/02/2024)   [employer] of Occupational Health - Occupational Stress Questionnaire    Feeling of Stress: Not at all  Social Connections: Moderately Integrated (02/02/2024)   Social Connection and Isolation Panel    Frequency of Communication with Friends and Family: More than three times a week    Frequency of Social Gatherings with Friends and Family: More than three times a week    Attends Religious Services: More than 4 times per year    Active Member of [employer] or Organizations: Yes    Attends Banker Meetings: More than 4 times per year    Marital Status: Widowed  Intimate Partner Violence: Not At Risk (01/28/2024)   Epic    Fear of Current or Ex-Partner: No    Emotionally Abused: No    Physically Abused: No    Sexually Abused: No  Depression (PHQ2-9): Low Risk (05/06/2024)   Depression (PHQ2-9)    PHQ-2 Score: 0  Alcohol Screen: Low Risk (01/28/2024)   Alcohol Screen    Last Alcohol Screening Score (AUDIT): 0  Housing: Low Risk (02/02/2024)   Epic    Unable to Pay for Housing in the Last Year: No    Number of Times Moved in the Last Year: 0    Homeless in the Last Year: No  Utilities: Not At Risk (01/28/2024)   Epic    Threatened with loss of utilities: No  Health Literacy: Adequate Health Literacy (01/28/2024)   B1300 Health Literacy    Frequency of need for help with medical instructions: Never    Outpatient Medications Prior to Visit  Medication Sig Dispense Refill   amLODipine  (NORVASC ) 5 MG tablet Take 1 tablet (5 mg total) by mouth daily. 30 tablet 0   aspirin 81 MG tablet Takes every other day     Blood Glucose Monitoring Suppl (ONE TOUCH BASIC SYSTEM) W/DEVICE KIT Use to check blood  sugar. DX. E11.9 1 each 0   carvedilol  (COREG ) 25 MG tablet Take 1 tablet (25 mg total) by mouth 2 (two) times daily with a meal. 180 tablet 1   Cholecalciferol (VITAMIN D3) 50 MCG (2000 UT) capsule Take 1 capsule (2,000 Units total) by mouth daily. 30 capsule 12   famotidine  (PEPCID ) 40 MG tablet Take 1 tablet (40 mg total) by mouth daily. 90 tablet 1   furosemide  (LASIX ) 40 MG tablet TAKE 1 TABLET (40  MG TOTAL) BY MOUTH 2 (TWO) TIMES DAILY. 40 MG TWICE DAILY EVERY OTHER DAY. ON THE ODD DAYS 40 MG ONCE DAILY. 180 tablet 1   losartan  (COZAAR ) 100 MG tablet TAKE 1 TABLET BY MOUTH EVERY EVENING. 90 tablet 1   Multiple Vitamin (MULTIVITAMIN) capsule Take 1 capsule by mouth daily.     ONE TOUCH LANCETS MISC Use as directed once daily to check blood sugar. DX: E11.9 100 each 0   potassium chloride  SA (KLOR-CON  M20) 20 MEQ tablet Take 1 tablet (20 mEq total) by mouth every other day. 45 tablet 0   RESTASIS 0.05 % ophthalmic emulsion INSTILL 1 DROP INTO BOTH EYES TWICE A DAY  3   Semaglutide  (RYBELSUS ) 3 MG TABS Take 1 tablet (3 mg total) by mouth daily. 90 tablet 1   rosuvastatin  (CRESTOR ) 40 MG tablet Take 1 tablet (40 mg total) by mouth daily. 90 tablet 1   No facility-administered medications prior to visit.    Allergies  Allergen Reactions   Metformin  And Related Diarrhea and Nausea Only    Review of Systems  Constitutional:  Negative for chills, fever and malaise/fatigue.  HENT:  Negative for congestion and hearing loss.   Eyes:  Negative for discharge.  Respiratory:  Negative for cough, sputum production and shortness of breath.   Cardiovascular:  Negative for chest pain, palpitations and leg swelling.  Gastrointestinal:  Negative for abdominal pain, blood in stool, constipation, diarrhea, heartburn, nausea and vomiting.  Genitourinary:  Negative for dysuria, frequency, hematuria and urgency.  Musculoskeletal:  Negative for back pain, falls and myalgias.  Skin:  Negative for rash.   Neurological:  Negative for dizziness, sensory change, loss of consciousness, weakness and headaches.  Endo/Heme/Allergies:  Negative for environmental allergies. Does not bruise/bleed easily.  Psychiatric/Behavioral:  Negative for depression and suicidal ideas. The patient is not nervous/anxious and does not have insomnia.        Objective:    Physical Exam Vitals and nursing note reviewed.  Constitutional:      General: She is not in acute distress.    Appearance: Normal appearance. She is well-developed.  HENT:     Head: Normocephalic and atraumatic.  Eyes:     General: No scleral icterus.       Right eye: No discharge.        Left eye: No discharge.  Cardiovascular:     Rate and Rhythm: Normal rate and regular rhythm.     Heart sounds: No murmur heard. Pulmonary:     Effort: Pulmonary effort is normal. No respiratory distress.     Breath sounds: Normal breath sounds.  Musculoskeletal:        General: Normal range of motion.     Cervical back: Normal range of motion and neck supple.     Right lower leg: No edema.     Left lower leg: No edema.  Skin:    General: Skin is warm and dry.  Neurological:     Mental Status: She is alert and oriented to person, place, and time.  Psychiatric:        Mood and Affect: Mood normal.        Behavior: Behavior normal.        Thought Content: Thought content normal.        Judgment: Judgment normal.     BP (!) 140/70 (BP Location: Right Arm, Patient Position: Sitting, Cuff Size: Large)   Pulse 72   Temp 97.6 F (36.4 C) (Oral)  Resp 18   Ht 5' 7.5 (1.715 m)   Wt 193 lb 3.2 oz (87.6 kg)   SpO2 97%   BMI 29.81 kg/m  Wt Readings from Last 3 Encounters:  06/23/24 193 lb 3.2 oz (87.6 kg)  05/06/24 197 lb (89.4 kg)  02/03/24 192 lb 12.8 oz (87.5 kg)    Diabetic Foot Exam - Simple   No data filed    Lab Results  Component Value Date   WBC 6.0 05/06/2024   HGB 11.6 (L) 05/06/2024   HCT 36.5 05/06/2024   PLT 256.0  05/06/2024   GLUCOSE 117 (H) 05/06/2024   CHOL 256 (H) 05/06/2024   TRIG 95.0 05/06/2024   HDL 52.10 05/06/2024   LDLDIRECT 181.0 01/03/2020   LDLCALC 184 (H) 05/06/2024   ALT 9 05/06/2024   AST 12 05/06/2024   NA 142 05/06/2024   K 3.9 05/06/2024   CL 102 05/06/2024   CREATININE 0.82 05/06/2024   BUN 16 05/06/2024   CO2 32 05/06/2024   TSH 3.92 06/23/2023   HGBA1C 6.8 (H) 05/06/2024   MICROALBUR 1.8 02/03/2024    Lab Results  Component Value Date   TSH 3.92 06/23/2023   Lab Results  Component Value Date   WBC 6.0 05/06/2024   HGB 11.6 (L) 05/06/2024   HCT 36.5 05/06/2024   MCV 74.8 (L) 05/06/2024   PLT 256.0 05/06/2024   Lab Results  Component Value Date   NA 142 05/06/2024   K 3.9 05/06/2024   CO2 32 05/06/2024   GLUCOSE 117 (H) 05/06/2024   BUN 16 05/06/2024   CREATININE 0.82 05/06/2024   BILITOT 0.4 05/06/2024   ALKPHOS 58 05/06/2024   AST 12 05/06/2024   ALT 9 05/06/2024   PROT 7.5 05/06/2024   ALBUMIN 4.4 05/06/2024   CALCIUM  9.4 05/06/2024   ANIONGAP 9 12/04/2017   EGFR 80 11/19/2020   GFR 69.36 05/06/2024   Lab Results  Component Value Date   CHOL 256 (H) 05/06/2024   Lab Results  Component Value Date   HDL 52.10 05/06/2024   Lab Results  Component Value Date   LDLCALC 184 (H) 05/06/2024   Lab Results  Component Value Date   TRIG 95.0 05/06/2024   Lab Results  Component Value Date   CHOLHDL 5 05/06/2024   Lab Results  Component Value Date   HGBA1C 6.8 (H) 05/06/2024       Assessment & Plan:  Mixed hyperlipidemia -     Rosuvastatin  Calcium ; Take 1 tablet (20 mg total) by mouth daily.  Dispense: 90 tablet; Refill: 3 -     CT CARDIAC SCORING (SELF PAY ONLY); Future -     Comprehensive metabolic panel with GFR; Future -     Lipid panel; Future -     Lipoprotein A (LPA); Future  Type 2 diabetes mellitus with hyperosmolarity without coma, without long-term current use of insulin (HCC) -     OneTouch UltraSoft Lancets; Use as  instructed  Dispense: 100 each; Refill: 12 -     CT CARDIAC SCORING (SELF PAY ONLY); Future -     Hemoglobin A1c; Future  Other iron  deficiency anemia -     Iron  (Ferrous Sulfate ); 1 po every day  Dispense: 90 tablet; Refill: 3 -     Iron , TIBC and Ferritin Panel; Future -     CBC with Differential/Platelet; Future -     Vitamin B12; Future  Assessment and Plan Assessment & Plan Mixed hyperlipidemia   Cholesterol levels  are elevated. She experienced intolerance to 40 mg of rosuvastatin  and prefers to return to 20 mg. Discussed genetic testing for LP(a) and a calcium  score CT scan to assess cardiovascular risk. LP(a) is covered by insurance, while the calcium  score CT scan costs $99 and is not covered. If LP(a) or calcium  score is elevated, more aggressive cholesterol management may be necessary, including alternative medications or referral to a lipid clinic. Injectable medications with fewer side effects are available if needed. Prescribed rosuvastatin  20 mg daily. Ordered LP(a) genetic test to be done in two months. Instructed to schedule a calcium  score CT scan at radiology. Will consider referral to a lipid clinic if unable to tolerate high-dose statin.  Type 2 diabetes mellitus with hyperosmolarity   A1c was good a month ago. She has run out of lancets for blood glucose monitoring. Prescribed OneTouch Ultra Soft lancets.  Iron  deficiency anemia   Reports fatigue and falls asleep easily, indicating possible iron  deficiency. Hemoglobin was 11.6 two months ago. She is currently taking one iron  supplement daily. Continue iron  supplementation. Ordered hemoglobin test to be done in two months.    Kashauna Celmer R Lowne Chase, DO  "

## 2024-06-23 NOTE — Patient Instructions (Signed)
 Cholesterol Content in Foods Cholesterol is a waxy, fat-like substance that helps to carry fat in the blood. The body needs cholesterol in small amounts, but too much cholesterol can cause damage to the arteries and heart. What foods have cholesterol?  Cholesterol is found in animal-based foods, such as meat, seafood, and dairy. Generally, low-fat dairy and lean meats have less cholesterol than full-fat dairy and fatty meats. The milligrams of cholesterol per serving (mg per serving) of common cholesterol-containing foods are listed below. Meats and other proteins Egg -- one large whole egg has 186 mg. Veal shank -- 4 oz (113 g) has 141 mg. Lean ground [place] (93% lean) -- 4 oz (113 g) has 118 mg. Fat-trimmed lamb loin -- 4 oz (113 g) has 106 mg. Lean ground beef (90% lean) -- 4 oz (113 g) has 100 mg. Lobster -- 3.5 oz (99 g) has 90 mg. Pork loin chops -- 4 oz (113 g) has 86 mg. Canned salmon -- 3.5 oz (99 g) has 83 mg. Fat-trimmed beef top loin -- 4 oz (113 g) has 78 mg. Frankfurter -- 1 frank (3.5 oz or 99 g) has 77 mg. Crab -- 3.5 oz (99 g) has 71 mg. Roasted chicken without skin, white meat -- 4 oz (113 g) has 66 mg. Light bologna -- 2 oz (57 g) has 45 mg. Deli-cut [place] -- 2 oz (57 g) has 31 mg. Canned tuna -- 3.5 oz (99 g) has 31 mg. Tomasa Blase -- 1 oz (28 g) has 29 mg. Oysters and mussels (raw) -- 3.5 oz (99 g) has 25 mg. Mackerel -- 1 oz (28 g) has 22 mg. Trout -- 1 oz (28 g) has 20 mg. Pork sausage -- 1 link (1 oz or 28 g) has 17 mg. Salmon -- 1 oz (28 g) has 16 mg. Tilapia -- 1 oz (28 g) has 14 mg. Dairy Soft-serve ice cream --  cup (4 oz or 86 g) has 103 mg. Whole-milk yogurt -- 1 cup (8 oz or 245 g) has 29 mg. Cheddar cheese -- 1 oz (28 g) has 28 mg. American cheese -- 1 oz (28 g) has 28 mg. Whole milk -- 1 cup (8 oz or 250 mL) has 23 mg. 2% milk -- 1 cup (8 oz or 250 mL) has 18 mg. Cream cheese -- 1 tablespoon (Tbsp) (14.5 g) has 15 mg. Cottage cheese --  cup (4 oz or  113 g) has 14 mg. Low-fat (1%) milk -- 1 cup (8 oz or 250 mL) has 10 mg. Sour cream -- 1 Tbsp (12 g) has 8.5 mg. Low-fat yogurt -- 1 cup (8 oz or 245 g) has 8 mg. Nonfat Greek yogurt -- 1 cup (8 oz or 228 g) has 7 mg. Half-and-half cream -- 1 Tbsp (15 mL) has 5 mg. Fats and oils Cod liver oil -- 1 tablespoon (Tbsp) (13.6 g) has 82 mg. Butter -- 1 Tbsp (14 g) has 15 mg. Lard -- 1 Tbsp (12.8 g) has 14 mg. Bacon grease -- 1 Tbsp (12.9 g) has 14 mg. Mayonnaise -- 1 Tbsp (13.8 g) has 5-10 mg. Margarine -- 1 Tbsp (14 g) has 3-10 mg. The items listed above may not be a complete list of foods with cholesterol. Exact amounts of cholesterol in these foods may vary depending on specific ingredients and brands. Contact a dietitian for more information. What foods do not have cholesterol? Most plant-based foods do not have cholesterol unless you combine them with a food that has  cholesterol. Foods without cholesterol include: Grains and cereals. Vegetables. Fruits. Vegetable oils, such as olive, canola, and sunflower oil. Legumes, such as peas, beans, and lentils. Nuts and seeds. Egg whites. The items listed above may not be a complete list of foods that do not have cholesterol. Contact a dietitian for more information. Summary The body needs cholesterol in small amounts, but too much cholesterol can cause damage to the arteries and heart. Cholesterol is found in animal-based foods, such as meat, seafood, and dairy. Generally, low-fat dairy and lean meats have less cholesterol than full-fat dairy and fatty meats. This information is not intended to replace advice given to you by your health care provider. Make sure you discuss any questions you have with your health care provider. Document Revised: 10/12/2020 Document Reviewed: 10/12/2020 Elsevier Patient Education  2024 [employer].

## 2024-06-29 ENCOUNTER — Other Ambulatory Visit (HOSPITAL_BASED_OUTPATIENT_CLINIC_OR_DEPARTMENT_OTHER)

## 2024-08-04 ENCOUNTER — Ambulatory Visit: Admitting: Student

## 2024-08-12 ENCOUNTER — Ambulatory Visit: Admitting: Student

## 2024-08-18 ENCOUNTER — Other Ambulatory Visit

## 2025-01-31 ENCOUNTER — Ambulatory Visit
